# Patient Record
Sex: Female | Born: 1940 | Race: White | Hispanic: No | Smoking: Never smoker
Health system: Southern US, Community
[De-identification: ages and names within clinical notes are randomized; demographics above are authoritative.]

## PROBLEM LIST (undated history)

## (undated) DIAGNOSIS — M5090 Cervical disc disorder, unspecified, unspecified cervical region: Secondary | ICD-10-CM

## (undated) DIAGNOSIS — Z9889 Other specified postprocedural states: Secondary | ICD-10-CM

## (undated) DIAGNOSIS — M1990 Unspecified osteoarthritis, unspecified site: Secondary | ICD-10-CM

## (undated) DIAGNOSIS — J309 Allergic rhinitis, unspecified: Secondary | ICD-10-CM

## (undated) DIAGNOSIS — I10 Essential (primary) hypertension: Secondary | ICD-10-CM

## (undated) DIAGNOSIS — Z8739 Personal history of other diseases of the musculoskeletal system and connective tissue: Secondary | ICD-10-CM

## (undated) DIAGNOSIS — Z8709 Personal history of other diseases of the respiratory system: Secondary | ICD-10-CM

## (undated) DIAGNOSIS — N189 Chronic kidney disease, unspecified: Secondary | ICD-10-CM

## (undated) DIAGNOSIS — K219 Gastro-esophageal reflux disease without esophagitis: Secondary | ICD-10-CM

## (undated) DIAGNOSIS — Z923 Personal history of irradiation: Secondary | ICD-10-CM

## (undated) DIAGNOSIS — C801 Malignant (primary) neoplasm, unspecified: Secondary | ICD-10-CM

## (undated) DIAGNOSIS — Z86718 Personal history of other venous thrombosis and embolism: Secondary | ICD-10-CM

## (undated) DIAGNOSIS — R112 Nausea with vomiting, unspecified: Secondary | ICD-10-CM

## (undated) DIAGNOSIS — E785 Hyperlipidemia, unspecified: Secondary | ICD-10-CM

## (undated) DIAGNOSIS — Z860100 Personal history of colon polyps, unspecified: Secondary | ICD-10-CM

## (undated) DIAGNOSIS — B029 Zoster without complications: Secondary | ICD-10-CM

## (undated) HISTORY — PX: PARTIAL HYSTERECTOMY: SHX80

## (undated) HISTORY — PX: HERNIA REPAIR: SHX51

## (undated) HISTORY — DX: Hyperlipidemia, unspecified: E78.5

## (undated) HISTORY — DX: Allergic rhinitis, unspecified: J30.9

## (undated) HISTORY — PX: ABDOMINAL HYSTERECTOMY: SHX81

## (undated) HISTORY — DX: Personal history of colon polyps, unspecified: Z86.0100

## (undated) HISTORY — DX: Essential (primary) hypertension: I10

## (undated) HISTORY — PX: EYE SURGERY: SHX253

## (undated) HISTORY — PX: WRIST SURGERY: SHX841

## (undated) HISTORY — PX: TUBAL LIGATION: SHX77

## (undated) HISTORY — DX: Personal history of other venous thrombosis and embolism: Z86.718

## (undated) HISTORY — DX: Personal history of colonic polyps: Z86.010

## (undated) HISTORY — DX: Gastro-esophageal reflux disease without esophagitis: K21.9

## (undated) HISTORY — PX: UPPER GI ENDOSCOPY: SHX6162

## (undated) HISTORY — PX: ELBOW SURGERY: SHX618

---

## 2004-09-14 ENCOUNTER — Ambulatory Visit: Payer: Self-pay | Admitting: Internal Medicine

## 2005-02-07 ENCOUNTER — Other Ambulatory Visit: Payer: Self-pay

## 2005-02-13 ENCOUNTER — Ambulatory Visit: Payer: Self-pay | Admitting: Obstetrics and Gynecology

## 2005-10-12 ENCOUNTER — Ambulatory Visit: Payer: Self-pay | Admitting: Internal Medicine

## 2006-05-09 ENCOUNTER — Ambulatory Visit: Payer: Self-pay | Admitting: Gastroenterology

## 2006-12-04 ENCOUNTER — Ambulatory Visit: Payer: Self-pay | Admitting: Obstetrics and Gynecology

## 2007-12-10 ENCOUNTER — Ambulatory Visit: Payer: Self-pay | Admitting: Internal Medicine

## 2007-12-18 ENCOUNTER — Ambulatory Visit: Payer: Self-pay | Admitting: Internal Medicine

## 2008-11-13 ENCOUNTER — Ambulatory Visit: Payer: Self-pay | Admitting: Specialist

## 2008-11-18 ENCOUNTER — Ambulatory Visit: Payer: Self-pay | Admitting: Specialist

## 2008-12-08 ENCOUNTER — Ambulatory Visit: Payer: Self-pay | Admitting: Gastroenterology

## 2008-12-23 ENCOUNTER — Ambulatory Visit: Payer: Self-pay | Admitting: Internal Medicine

## 2009-04-28 ENCOUNTER — Ambulatory Visit: Payer: Self-pay | Admitting: Internal Medicine

## 2010-02-23 ENCOUNTER — Ambulatory Visit: Payer: Self-pay | Admitting: Internal Medicine

## 2011-01-25 ENCOUNTER — Ambulatory Visit: Payer: Self-pay | Admitting: Gastroenterology

## 2011-02-07 HISTORY — PX: COLONOSCOPY: SHX174

## 2011-04-11 DIAGNOSIS — K219 Gastro-esophageal reflux disease without esophagitis: Secondary | ICD-10-CM | POA: Insufficient documentation

## 2011-05-17 ENCOUNTER — Ambulatory Visit: Payer: Self-pay | Admitting: Internal Medicine

## 2011-06-28 ENCOUNTER — Ambulatory Visit: Payer: Self-pay | Admitting: Specialist

## 2012-03-14 ENCOUNTER — Encounter: Payer: Self-pay | Admitting: Internal Medicine

## 2012-03-14 ENCOUNTER — Ambulatory Visit (INDEPENDENT_AMBULATORY_CARE_PROVIDER_SITE_OTHER): Payer: Medicare Other | Admitting: Internal Medicine

## 2012-03-14 VITALS — BP 148/88 | HR 97 | Temp 98.2°F | Ht 62.0 in | Wt 132.4 lb

## 2012-03-14 DIAGNOSIS — R058 Other specified cough: Secondary | ICD-10-CM | POA: Insufficient documentation

## 2012-03-14 DIAGNOSIS — J45909 Unspecified asthma, uncomplicated: Secondary | ICD-10-CM

## 2012-03-14 DIAGNOSIS — R059 Cough, unspecified: Secondary | ICD-10-CM

## 2012-03-14 DIAGNOSIS — R05 Cough: Secondary | ICD-10-CM

## 2012-03-14 MED ORDER — PREDNISONE (PAK) 10 MG PO TABS: ORAL_TABLET | ORAL | Status: DC

## 2012-03-14 MED ORDER — TRAMADOL HCL 50 MG PO TABS: ORAL_TABLET | ORAL | Status: DC

## 2012-03-14 MED ORDER — PANTOPRAZOLE SODIUM 40 MG PO TBEC: 40.0000 mg | DELAYED_RELEASE_TABLET | Freq: Every day | ORAL | Status: DC

## 2012-03-14 MED ORDER — FAMOTIDINE 20 MG PO TABS: ORAL_TABLET | ORAL | Status: DC

## 2012-03-14 NOTE — Progress Notes (Signed)
  Subjective:    Patient ID: Jane Griffin, female    DOB: 08/29/1940  MRN: [id number]  HPI  57 yowf never smoker with no problem until ? In 31's dx of asthma then 2004 dx of asthma and rec chronic singulair rx but did maintain it then started on advair daily x 2008 self referred 03/14/2012 for cough.  03/14/2012 1st pulmonary ov cc daily cough x 3 years  on advair feels choked but minimal mucus and occ vomits. If not coughing breathing is ok.   W/u for gerd by [facility] c/w mild gerd. Some better with mucinex dm.  Sleeping ok without nocturnal  or early am exacerbation  of respiratory  c/o's or need for noct saba. Also denies any obvious fluctuation of symptoms with weather or environmental changes or other aggravating or alleviating factors except as outlined above.     Review of Systems  Constitutional: Negative for fever and unexpected weight change.  HENT: Positive for congestion, rhinorrhea, sneezing, dental problem and sinus pressure. Negative for ear pain, nosebleeds, sore throat, trouble swallowing and postnasal drip.   Eyes: Negative for redness and itching.  Respiratory: Positive for cough, chest tightness and shortness of breath. Negative for wheezing.   Cardiovascular: Negative for palpitations and leg swelling.  Gastrointestinal: Positive for vomiting. Negative for nausea.  Genitourinary: Negative for dysuria.  Musculoskeletal: Negative for joint swelling.  Skin: Negative for rash.  Neurological: Positive for headaches.  Hematological: Does not bruise/bleed easily.  Psychiatric/Behavioral: Positive for dysphoric mood. The patient is nervous/anxious.        Objective:   Physical Exam  amb wf nad with classic voice fatigue Wt Readings from Last 3 Encounters:  03/14/12 132 lb 6.4 oz (60.056 kg)    HEENT: nl dentition, turbinates, and orophanx. Nl external ear canals without cough reflex   NECK :  without JVD/Nodes/TM/ nl carotid upstrokes bilaterally   LUNGS: no acc muscle  use, clear to A and P bilaterally without cough on insp or exp maneuvers   CV:  RRR  no s3 or murmur or increase in P2, no edema   ABD:  soft and nontender with nl excursion in the supine position. No bruits or organomegaly, bowel sounds nl  MS:  warm without deformities, calf tenderness, cyanosis or clubbing  SKIN: warm and dry without lesions    NEURO:  alert, approp, no deficits    CT Chest [place] Calcified granuloma       Assessment & Plan:

## 2012-03-14 NOTE — Assessment & Plan Note (Signed)
Not clear she really has this or whether needs maint rx, esp since symptoms are atypical day >> night unusual of asthma related cough, and she's only having sob when coughing  Since advair may worsen upper airway cough syndrome rec trial off and consider rechallenge with singulair if symptoms flare off advair

## 2012-03-14 NOTE — Assessment & Plan Note (Addendum)
The most common causes of chronic cough in immunocompetent adults include the following: upper airway cough syndrome (UACS), previously referred to as postnasal drip syndrome (PNDS), which is caused by variety of rhinosinus conditions; (2) asthma; (3) GERD; (4) chronic bronchitis from cigarette smoking or other inhaled environmental irritants; (5) nonasthmatic eosinophilic bronchitis; and (6) bronchiectasis.   These conditions, singly or in combination, have accounted for up to 94% of the causes of chronic cough in prospective studies.   Other conditions have constituted no >6% of the causes in prospective studies These have included bronchogenic carcinoma, chronic interstitial pneumonia, sarcoidosis, left ventricular failure, ACEI-induced cough, and aspiration from a condition associated with pharyngeal dysfunction.    Chronic cough is often simultaneously caused by more than one condition. A single cause has been found from 38 to 82% of the time, multiple causes from 18 to 62%. Multiply caused cough has been the result of three diseases up to 42% of the time.       Most likely this is  Classic Upper airway cough syndrome, so named because it's frequently impossible to sort out how much is  CR/sinusitis with freq throat clearing (which can be related to primary GERD)   vs  causing  secondary (" extra esophageal")  GERD from wide swings in gastric pressure that occur with throat clearing, often  promoting self use of mint and menthol lozenges that reduce the lower esophageal sphincter tone and exacerbate the problem further in a cyclical fashion.   These are the same pts (now being labeled as having "irritable larynx syndrome" by some cough centers) who not infrequently have a history of having failed to tolerate ace inhibitors,  dry powder inhalers like advair, which she is on chronically,  or biphosphonates or report having atypical reflux symptoms that don't respond to standard doses of PPI , and are  easily confused as having aecopd or asthma flares by even experienced allergists/ pulmonologists.   For now max rx for gerd then regroup in 4 weeks

## 2012-03-14 NOTE — Patient Instructions (Addendum)
Stop prilosec Try Pantoprazole 40 mg  Take 30-60 min before first meal of the day and Pepcid 20 mg one bedtime until  Return  Stop advair  Take  mucinex dm (guainefisin) 1200mg   every 12 hours and supplement if needed with  tramadol 50 mg up to 1- 2 every 4 hours to suppress the urge to cough. Swallowing water or using ice chips/non mint and menthol containing candies (such as lifesavers or sugarless jolly ranchers) are also effective.  You should rest your voice and avoid activities that you know make you cough.  Once you have eliminated the cough for 3 straight days try reducing the tramadol first,  then the delsym as tolerated.    GERD (REFLUX)  is an extremely common cause of respiratory symptoms, many times with no significant heartburn at all.    It can be treated with medication, but also with lifestyle changes including avoidance of late meals, excessive alcohol, smoking cessation, and avoid fatty foods, chocolate, peppermint, colas, red wine, and acidic juices such as orange juice.  NO MINT OR MENTHOL PRODUCTS SO NO COUGH DROPS  USE SUGARLESS CANDY INSTEAD (jolley ranchers or Stover's)  NO OIL BASED VITAMINS - use powdered substitutes.  Prednisone 10 mg take  4 each am x 2 days,   2 each am x 2 days,  1 each am x2days and stop    Please schedule a follow up office visit in 4 weeks (ok to schedule at [facility])  sooner if needed - could start singulair back if not improving

## 2012-04-05 ENCOUNTER — Telehealth: Payer: Self-pay | Admitting: Internal Medicine

## 2012-04-05 MED ORDER — TRAMADOL HCL 50 MG PO TABS: ORAL_TABLET | ORAL | Status: DC

## 2012-04-05 NOTE — Telephone Encounter (Signed)
Having some nausea from tramadol but working great so rec she try to wean/ cut in half and f/u next avail ov

## 2012-04-19 ENCOUNTER — Ambulatory Visit (INDEPENDENT_AMBULATORY_CARE_PROVIDER_SITE_OTHER): Payer: 59 | Admitting: Internal Medicine

## 2012-04-19 ENCOUNTER — Encounter: Payer: Self-pay | Admitting: Internal Medicine

## 2012-04-19 VITALS — BP 114/80 | HR 69 | Temp 97.4°F | Ht 62.0 in | Wt 128.0 lb

## 2012-04-19 DIAGNOSIS — R059 Cough, unspecified: Secondary | ICD-10-CM

## 2012-04-19 DIAGNOSIS — R05 Cough: Secondary | ICD-10-CM

## 2012-04-19 MED ORDER — TRAMADOL HCL 50 MG PO TABS: ORAL_TABLET | ORAL | Status: DC

## 2012-04-19 MED ORDER — GABAPENTIN 100 MG PO CAPS: 100.0000 mg | ORAL_CAPSULE | Freq: Three times a day (TID) | ORAL | Status: DC

## 2012-04-19 NOTE — Patient Instructions (Addendum)
Start neurontin 100 mg three times a day  Take mucinex dm 1200 mg  every 12 hours and supplement if needed with  tramadol 50 mg up to 2 every 4 hours to suppress the urge to cough. Swallowing water or using ice chips/non mint and menthol containing candies (such as lifesavers or sugarless jolly ranchers) are also effective.  You should rest your voice and avoid activities that you know make you cough.  Once you have eliminated the cough and the urge to clear your throat for 3 straight days try reducing the tramadol first,  then the mucinex dm  tolerated.    Please schedule a follow up office visit in 4 weeks, sooner if needed

## 2012-04-19 NOTE — Progress Notes (Signed)
  Subjective:    Patient ID: Jane Griffin, female    DOB: 23-Jan-1941  MRN: [id number]  HPI  28 yowf never smoker with no problem until ? In 34's dx of asthma then 2004 dx of asthma and rec chronic singulair rx but did maintain it then started on advair daily x 2008 self referred 03/14/2012 for cough.  03/14/2012 1st pulmonary ov cc daily cough x 3 years  on advair feels choked but minimal mucus and occ vomits. If not coughing breathing is ok.   W/u for gerd by [facility] c/w mild gerd. Some better with mucinex dm. rec Stop prilosec Try Pantoprazole 40 mg  Take 30-60 min before first meal of the day and Pepcid 20 mg one bedtime until  Return Stop advair Take  mucinex dm (guainefisin) 1200mg   every 12 hours and supplement if needed with  tramadol 50 mg up to 1- 2 every 4 hours to suppress the urge to cough.   GERD diet  Prednisone 10 mg take  4 each am x 2 days,   2 each am x 2 days,  1 each am x2days and stop  Please schedule a follow up office visit in 4 weeks (ok to schedule at [facility])  sooner if needed - could start singulair back if not improving   04/19/2012 f/u ov/[place] cc 90% better, still urge to clear her throat. Still taking tramadol 50  One half 3 x daily and whole at bedtime. No obvious daytime variabilty or assoc sob cp or chest tightness, subjective wheeze overt sinus or hb symptoms. No unusual exp hx or h/o childhood pna/ asthma or premature birth to her knowledge.   States she's thought about it now and singulair was no help   Sleeping ok without nocturnal  or early am exacerbation  of respiratory  c/o's or need for noct saba. Also denies any obvious fluctuation of symptoms with weather or environmental changes or other aggravating or alleviating factors except as outlined above.           Objective:   Physical Exam  amb wf nad but  still occ throat clearing  04/19/2012   Wt  128  Wt Readings from Last 3 Encounters:  03/14/12 132 lb 6.4 oz (60.056 kg)    HEENT: nl  dentition, turbinates, and orophanx. Nl external ear canals without cough reflex   NECK :  without JVD/Nodes/TM/ nl carotid upstrokes bilaterally   LUNGS: no acc muscle use, clear to A and P bilaterally without cough on insp or exp maneuvers   CV:  RRR  no s3 or murmur or increase in P2, no edema   ABD:  soft and nontender with nl excursion in the supine position. No bruits or organomegaly, bowel sounds nl  MS:  warm without deformities, calf tenderness, cyanosis or clubbing  SKIN: warm and dry without lesions    NEURO:  alert, approp, no deficits    CT Chest [place]  Calcified granuloma       Assessment & Plan:

## 2012-04-21 NOTE — Assessment & Plan Note (Addendum)
No flare of symptoms off advair reinforces  Classic Upper airway cough syndrome, so named because it's frequently impossible to sort out how much is  CR/sinusitis with freq throat clearing (which can be related to primary GERD)   vs  causing  secondary (" extra esophageal")  GERD from wide swings in gastric pressure that occur with throat clearing, often  promoting self use of mint and menthol lozenges that reduce the lower esophageal sphincter tone and exacerbate the problem further in a cyclical fashion.   These are the same pts (now being labeled as having "irritable larynx syndrome" by some cough centers) who not infrequently have a history of having failed to tolerate ace inhibitors,  dry powder inhalers or biphosphonates or report having atypical reflux symptoms that don't respond to standard doses of PPI , and are easily confused as having aecopd or asthma flares by even experienced allergists/ pulmonologists.   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  Please see instructions for details which were reviewed in writing and the patient given a copy.

## 2012-05-15 ENCOUNTER — Encounter: Payer: Self-pay | Admitting: Internal Medicine

## 2012-05-15 ENCOUNTER — Telehealth: Payer: Self-pay | Admitting: Internal Medicine

## 2012-05-15 ENCOUNTER — Ambulatory Visit (INDEPENDENT_AMBULATORY_CARE_PROVIDER_SITE_OTHER): Payer: 59 | Admitting: Internal Medicine

## 2012-05-15 VITALS — BP 118/70 | HR 88 | Temp 98.1°F | Ht 62.0 in | Wt 129.0 lb

## 2012-05-15 DIAGNOSIS — R05 Cough: Secondary | ICD-10-CM

## 2012-05-15 DIAGNOSIS — J45909 Unspecified asthma, uncomplicated: Secondary | ICD-10-CM

## 2012-05-15 DIAGNOSIS — R059 Cough, unspecified: Secondary | ICD-10-CM

## 2012-05-15 MED ORDER — PREDNISONE (PAK) 10 MG PO TABS: ORAL_TABLET | ORAL | Status: DC

## 2012-05-15 NOTE — Patient Instructions (Addendum)
Start neurontin 100 mg four times a day and call me with name of the other bedtime pill you are taking as it is not on our list  Prednisone 10 mg take  4 each am x 2 days,   2 each am x 2 days,  1 each am x2days and stop   Take mucinex dm 1200 mg  every 12 hours and supplement if needed with  tramadol 50 mg up to 2 every 4 hours to suppress the urge to cough. Swallowing water or using ice chips/non mint and menthol containing candies (such as lifesavers or sugarless jolly ranchers) are also effective.  You should rest your voice and avoid activities that you know make you cough.  Once you have eliminated the cough and the urge to clear your throat for 3 straight days try reducing the tramadol first,  then the mucinex dm  tolerated.    Please schedule a follow up office visit in 4 weeks, sooner if needed

## 2012-05-15 NOTE — Telephone Encounter (Signed)
Will forward to MW as FYI  

## 2012-05-15 NOTE — Assessment & Plan Note (Signed)
-   Trial off advair 03/14/2012 >>> cough better but still throat clearing - Neurontin trial 04/19/12  Still strongly feel this is  Classic Upper airway cough syndrome, so named because it's frequently impossible to sort out how much is  CR/sinusitis with freq throat clearing (which can be related to primary GERD)   vs  causing  secondary (" extra esophageal")  GERD from wide swings in gastric pressure that occur with throat clearing, often  promoting self use of mint and menthol lozenges that reduce the lower esophageal sphincter tone and exacerbate the problem further in a cyclical fashion.   These are the same pts (now being labeled as having "irritable larynx syndrome" by some cough centers) who not infrequently have a history of having failed to tolerate ace inhibitors,  dry powder inhalers or biphosphonates or report having atypical reflux symptoms that don't respond to standard doses of PPI , and are easily confused as having aecopd or asthma flares by even experienced allergists/ pulmonologists.   The standardized cough guidelines published in Chest by [place] in 2006 are still the best available and consist of a multiple step process (up to 12!) , not a single office visit,  and are intended  to address this problem logically,  with an alogrithm dependent on response to empiric treatment at  each progressive step  to determine a specific diagnosis with  minimal addtional testing needed. Therefore if adherence is an issue or can't be accurately verified,  it's very unlikely the standard evaluation and treatment will be successful here.    Furthermore, response to therapy (other than acute cough suppression, which should only be used short term with avoidance of narcotic containing cough syrups if possible), can be a gradual process for which the patient may perceive immediate benefit.  Unlike going to an eye doctor where the best perscription is almost always the first one and is immediately  effective, this is almost never the case in the management of chronic cough syndromes. Therefore the patient needs to commit up front to consistently adhere to recommendations  for up to 6 weeks of therapy directed at the likely underlying problem(s) before the response can be reasonably evaluated.   Explained maint vs prns with neurontin the former, not the latter, so try increase neurontin to 100 mg qid and push to max of 900 mg per day based on her partial response

## 2012-05-15 NOTE — Telephone Encounter (Signed)
Pt aware and denied any questions

## 2012-05-15 NOTE — Telephone Encounter (Signed)
Make sure she knows that is the same as pepcid so should not be taking both

## 2012-05-15 NOTE — Progress Notes (Signed)
Subjective:    Patient ID: Jane Griffin, female    DOB: 1940-06-02  MRN: [id number]  HPI  75 yowf never smoker with no problem until ? In 39's dx of asthma then 2004 dx of asthma and rec chronic singulair rx but did maintain it then started on advair daily x 2008 self referred 03/14/2012 for cough.  03/14/2012 1st pulmonary ov cc daily cough x 3 years  on advair feels choked but minimal mucus and occ vomits. If not coughing breathing is ok.   W/u for gerd by [facility] c/w mild gerd. Some better with mucinex dm. rec Stop prilosec Try Pantoprazole 40 mg  Take 30-60 min before first meal of the day and Pepcid 20 mg one bedtime until  Return Stop advair Take  mucinex dm (guainefisin) 1200mg   every 12 hours and supplement if needed with  tramadol 50 mg up to 1- 2 every 4 hours to suppress the urge to cough.   GERD diet  Prednisone 10 mg take  4 each am x 2 days,   2 each am x 2 days,  1 each am x 2 days and stop  Please schedule a follow up office visit in 4 weeks (ok to schedule at [facility])  sooner if needed - could start singulair back if not improving   04/19/2012 f/u ov/[place] [place] cc 90% better, still urge to clear her throat. Still taking tramadol 50  One half 3 x daily and whole at bedtime.  states she's thought about it now and singulair was no help rec Start neurontin 100 mg three times a day  Take mucinex dm 1200 mg  every 12 hours and supplement if needed with  tramadol 50 mg up to 2 every 4 hours to suppress the urge to cough Once you have eliminated the cough and the urge to clear your throat for 3 straight days try reducing the tramadol first,  then the mucinex dm  tolerated.      05/15/2012 pulmonary f/u ov/ [place] Chief Complaint  Patient presents with  . Follow-up    Cough is some better since started neurontin, she notices the cough is worse if she forgets to take med.    Confused with meds and the difference between mant neurontin and prn tramadol but does feel better since taking  neuronitin    No obvious daytime variabilty or assoc sob cp or chest tightness, subjective wheeze overt sinus or hb symptoms. No unusual exp hx or h/o childhood pna/ asthma or premature birth to her knowledge.     Sleeping ok without nocturnal  or early am exacerbation  of respiratory  c/o's or need for noct saba. Also denies any obvious fluctuation of symptoms with weather or environmental changes or other aggravating or alleviating factors except as outlined above.  ROS  The following are not active complaints unless bolded sore throat, dysphagia, dental problems, itching, sneezing,  nasal congestion or excess/ purulent secretions, ear ache,   fever, chills, sweats, unintended wt loss, pleuritic or exertional cp, hemoptysis,  orthopnea pnd or leg swelling, presyncope, palpitations, heartburn, abdominal pain, anorexia, nausea, vomiting, diarrhea  or change in bowel or urinary habits, change in stools or urine, dysuria,hematuria,  rash, arthralgias, visual complaints, headache, numbness weakness or ataxia or problems with walking or coordination,  change in mood/affect or memory.               Objective:   Physical Exam  amb wf nad but  still occ throat clearing  04/19/2012  Wt  128  Wt Readings from Last 3 Encounters:  03/14/12 132 lb 6.4 oz (60.056 kg)    HEENT: nl dentition, turbinates, and orophanx. Nl external ear canals without cough reflex   NECK :  without JVD/Nodes/TM/ nl carotid upstrokes bilaterally   LUNGS: no acc muscle use, clear to A and P bilaterally without cough on insp or exp maneuvers   CV:  RRR  no s3 or murmur or increase in P2, no edema   ABD:  soft and nontender with nl excursion in the supine position. No bruits or organomegaly, bowel sounds nl  MS:  warm without deformities, calf tenderness, cyanosis or clubbing  SKIN: warm and dry without lesions    NEURO:  alert, approp, no deficits    CT Chest [place]  Calcified granuloma        Assessment & Plan:

## 2012-05-15 NOTE — Assessment & Plan Note (Signed)
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

## 2012-05-16 ENCOUNTER — Ambulatory Visit: Payer: 59 | Admitting: Internal Medicine

## 2012-05-17 ENCOUNTER — Ambulatory Visit: Payer: 59 | Admitting: Internal Medicine

## 2012-05-21 ENCOUNTER — Ambulatory Visit: Payer: Self-pay | Admitting: Internal Medicine

## 2012-05-23 ENCOUNTER — Other Ambulatory Visit: Payer: Self-pay | Admitting: Internal Medicine

## 2012-06-07 ENCOUNTER — Other Ambulatory Visit: Payer: Self-pay | Admitting: Internal Medicine

## 2012-06-13 ENCOUNTER — Ambulatory Visit: Payer: 59 | Admitting: Internal Medicine

## 2012-06-14 ENCOUNTER — Ambulatory Visit (INDEPENDENT_AMBULATORY_CARE_PROVIDER_SITE_OTHER): Payer: 59 | Admitting: Internal Medicine

## 2012-06-14 ENCOUNTER — Encounter: Payer: Self-pay | Admitting: Internal Medicine

## 2012-06-14 VITALS — BP 120/68 | HR 92 | Temp 97.6°F | Ht 62.0 in | Wt 126.0 lb

## 2012-06-14 DIAGNOSIS — R05 Cough: Secondary | ICD-10-CM

## 2012-06-14 DIAGNOSIS — R059 Cough, unspecified: Secondary | ICD-10-CM

## 2012-06-14 MED ORDER — TRAMADOL HCL 50 MG PO TABS: ORAL_TABLET | ORAL | Status: DC

## 2012-06-14 MED ORDER — MONTELUKAST SODIUM 10 MG PO TABS: ORAL_TABLET | ORAL | Status: DC

## 2012-06-14 MED ORDER — PREDNISONE (PAK) 10 MG PO TABS: ORAL_TABLET | ORAL | Status: DC

## 2012-06-14 NOTE — Patient Instructions (Addendum)
Prednisone 10 mg take  4 each am x 2 days,  2 each am x 2 days,  1 each am x2days and stop   Take delsym two tsp every 12 hours and supplement if needed with  tramadol 50 mg up to 2 every 4 hours to suppress the urge to cough. Swallowing water or using ice chips/non mint and menthol containing candies (such as lifesavers or sugarless jolly ranchers) are also effective.  You should rest your voice and avoid activities that you know make you cough.  Once you have eliminated the cough for 3 straight days try reducing the tramadol first,  then the delsym as tolerated.    Add singulair 10 mg one each pm   Please schedule a follow up office visit in 4 weeks, sooner if needed with all medications including over the counters Add Consider methacholine challenge test, sinus ct, allergy profile next

## 2012-06-14 NOTE — Progress Notes (Signed)
Subjective:    Patient ID: Jane Griffin, female    DOB: 1940-06-30  MRN: [id number]  HPI  81 yowf never smoker with no problem until ? In 88's dx of asthma then 2004 dx of asthma and rec chronic singulair rx but did not maintain it then started on advair daily x 2008 self referred 03/14/2012 for cough.  03/14/2012 1st pulmonary ov cc daily cough x 3 years  on advair feels choked but minimal mucus and occ vomits. If not coughing breathing is ok.   W/u for gerd by [facility] c/w mild gerd. Some better with mucinex dm. rec Stop prilosec Try Pantoprazole 40 mg  Take 30-60 min before first meal of the day and Pepcid 20 mg one bedtime until  Return Stop advair Take  mucinex dm (guainefisin) 1200mg   every 12 hours and supplement if needed with  tramadol 50 mg up to 1- 2 every 4 hours to suppress the urge to cough.   GERD diet  Prednisone 10 mg take  4 each am x 2 days,   2 each am x 2 days,  1 each am x 2 days and stop  Please schedule a follow up office visit in 4 weeks (ok to schedule at [facility])  sooner if needed - could start singulair back if not improving   04/19/2012 f/u ov/Jane Griffin cc 90% better, still urge to clear her throat. Still taking tramadol 50  One half 3 x daily and whole at bedtime.  states she's thought about it now and singulair was no help rec Start neurontin 100 mg three times a day Take mucinex dm 1200 mg  every 12 hours and supplement if needed with  tramadol 50 mg up to 2 every 4 hours to suppress the urge to cough Once you have eliminated the cough and the urge to clear your throat for 3 straight days try reducing the tramadol first,  then the mucinex dm  tolerated.      05/15/2012 pulmonary f/u ov/ [place] Chief Complaint  Patient presents with  . Follow-up    Cough is some better since started neurontin, she notices the cough is worse if she forgets to take med.    Confused with meds and the difference between mant neurontin and prn tramadol but does feel better since  taking neuronitin  rec Start neurontin 100 mg four times a day and call me with name of the other bedtime pill you are taking as it is not on our list Prednisone 10 mg take  4 each am x 2 days,   2 each am x 2 days,  1 each am x2days and stop  Take mucinex dm 1200 mg  every 12 hours and supplement if needed with  tramadol 50 mg up to 2 every 4 hours to suppress the urge to cough.  Once you have eliminated the cough and the urge to clear your throat for 3 straight days try reducing the tramadol first,  then the mucinex dm  Tolerated.  06/14/2012 f/u ov/Jane Griffin  Chief Complaint  Patient presents with  . Cough    Cough is unchanged, she tried taking gabapentin but still required mucinex and tramadol to control the cough. She took gabapentin for 3 wks and had to d/c med due to having nightmares.   Mostly dry cough is most bothersome in afternoons, sleeps fine. Off neurontin x one week and no change in cough, nightmares resolved.   No obvious daytime variabilty or assoc sob cp or chest tightness,  subjective wheeze overt sinus or hb symptoms. No unusual exp hx or h/o childhood pna/ asthma or premature birth to her knowledge.     Sleeping ok without nocturnal  or early am exacerbation  of respiratory  c/o's or need for noct saba. Also denies any obvious fluctuation of symptoms with weather or environmental changes or other aggravating or alleviating factors except as outlined above.  Current Medications, Allergies, Past Medical History, Past Surgical History, Family History, and Social History were reviewed in [employer] record.  ROS  The following are not active complaints unless bolded sore throat, dysphagia, dental problems, itching, sneezing,  nasal congestion or excess/ purulent secretions, ear ache,   fever, chills, sweats, unintended wt loss, pleuritic or exertional cp, hemoptysis,  orthopnea pnd or leg swelling, presyncope, palpitations, heartburn, abdominal pain,  anorexia, nausea, vomiting, diarrhea  or change in bowel or urinary habits, change in stools or urine, dysuria,hematuria,  rash, arthralgias, visual complaints, headache, numbness weakness or ataxia or problems with walking or coordination,  change in mood/affect or memory.                   Objective:   Physical Exam  amb wf nad but  still occ throat clearing  04/19/2012   Wt  128  Vs  06/14/2012  126  Wt Readings from Last 3 Encounters:  03/14/12 132 lb 6.4 oz (60.056 kg)    HEENT: nl dentition, turbinates, and orophanx. Nl external ear canals without cough reflex   NECK :  without JVD/Nodes/TM/ nl carotid upstrokes bilaterally   LUNGS: no acc muscle use, clear to A and P bilaterally without cough on insp or exp maneuvers   CV:  RRR  no s3 or murmur or increase in P2, no edema   ABD:  soft and nontender with nl excursion in the supine position. No bruits or organomegaly, bowel sounds nl  MS:  warm without deformities, calf tenderness, cyanosis or clubbing     CT Chest [place]  Calcified granuloma       Assessment & Plan:

## 2012-06-15 NOTE — Assessment & Plan Note (Signed)
At this point cough is better but not resolved and still has a tendency to cyclical coughing she's never really eliminated.  Still feel upper airway cough syndrome most likely but haven't ruled out asthma, her previous working dx, though absence of flare off advair and absence of noct symptoms rules against. Also still haven't accomplished full medication reconciliation ? Could she surreptitiously be on ACEi?   Next step is add singulair trial and try to get 100% ahead of the cough to see if it comes back again after stopping cough suppression, if so next step is methacholine challenge test based on published cough guidelines    Each maintenance medication was reviewed in detail including most importantly the difference between maintenance and as needed and under what circumstances the prns are to be used.  Please see instructions for details which were reviewed in writing and the patient given a copy.   To keep things simple, I have asked the patient to first separate medicines that are perceived as maintenance, that is to be taken daily "no matter what", from those medicines that are taken on only on an as-needed basis and I have given the patient examples of both, and then return here with both.

## 2012-07-04 ENCOUNTER — Telehealth: Payer: Self-pay | Admitting: Internal Medicine

## 2012-07-04 MED ORDER — FAMOTIDINE 20 MG PO TABS: ORAL_TABLET | ORAL | Status: DC

## 2012-07-04 MED ORDER — TRAMADOL HCL 50 MG PO TABS: ORAL_TABLET | ORAL | Status: DC

## 2012-07-04 NOTE — Telephone Encounter (Signed)
Rxs were refilled [facility] for the pt to be made aware

## 2012-07-22 ENCOUNTER — Ambulatory Visit: Payer: 59 | Admitting: Internal Medicine

## 2012-07-30 ENCOUNTER — Other Ambulatory Visit: Payer: Self-pay | Admitting: Internal Medicine

## 2012-07-31 ENCOUNTER — Telehealth: Payer: Self-pay | Admitting: Internal Medicine

## 2012-07-31 NOTE — Telephone Encounter (Signed)
MW- I got a refill request for tramadol and denied it since the pt has cancelled her last 6 appts with you!! She scheduled an appt with you for 08/21/12 and wants to know if she can have refill until then  I spoke with her and she states that her cough has improved, but when it does flare up, the tramadol really helps Please advise if ok to refill thanks!

## 2012-08-01 MED ORDER — TRAMADOL HCL 50 MG PO TABS: ORAL_TABLET | ORAL | Status: DC

## 2012-08-01 NOTE — Telephone Encounter (Signed)
First find out how many she takes on avg a week, then give her that wamt for 3 weeks but explain that will be the last refill we will be doing over the phone.

## 2012-08-01 NOTE — Telephone Encounter (Signed)
Spoke with patient, patient aware Rx has been sent to verified pharmacy and that she has to keep her scheduled follow up for any further refills.

## 2012-08-01 NOTE — Telephone Encounter (Signed)
ATC patient, no answer [id number] 

## 2012-08-01 NOTE — Telephone Encounter (Signed)
Returning call can be reached at [phone].Jane Griffin

## 2012-08-19 ENCOUNTER — Ambulatory Visit: Payer: 59 | Admitting: Internal Medicine

## 2012-08-21 ENCOUNTER — Encounter: Payer: Self-pay | Admitting: Internal Medicine

## 2012-08-21 ENCOUNTER — Ambulatory Visit (INDEPENDENT_AMBULATORY_CARE_PROVIDER_SITE_OTHER): Payer: Medicare Other | Admitting: Internal Medicine

## 2012-08-21 VITALS — BP 122/80 | HR 68 | Temp 98.8°F | Ht 62.0 in | Wt 125.0 lb

## 2012-08-21 DIAGNOSIS — R05 Cough: Secondary | ICD-10-CM

## 2012-08-21 DIAGNOSIS — R059 Cough, unspecified: Secondary | ICD-10-CM

## 2012-08-21 MED ORDER — TRAMADOL HCL 50 MG PO TABS: ORAL_TABLET | ORAL | Status: DC

## 2012-08-21 MED ORDER — PREDNISONE (PAK) 10 MG PO TABS: ORAL_TABLET | ORAL | Status: DC

## 2012-08-21 NOTE — Assessment & Plan Note (Addendum)
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 eos bronchitis aro rhinitis then consider allergy w/u, sinuc ct and methacholine challenge test based on published cough guidelines,      Each maintenance medication was reviewed in detail including most importantly the difference between maintenance and as needed and under what circumstances the prns are to be used.  Please see instructions for details which were reviewed in writing and the patient given a copy.

## 2012-08-21 NOTE — Progress Notes (Signed)
Subjective:    Patient ID: Jane Griffin, female    DOB: 07-31-1940  MRN: [id number]   Brief patient profile:  78 yowf never smoker with no problem until ? In 38's dx of asthma then 2004 dx of asthma and rec chronic singulair rx but did not maintain it then started on advair daily x 2008 self referred 03/14/2012 for cough x 2011   HPI 03/14/2012 1st pulmonary ov cc daily cough x 3 years  on advair feels choked but minimal mucus and occ vomits. If not coughing breathing is ok.   W/u for gerd by [facility] c/w mild gerd. Some better with mucinex dm. rec Stop prilosec Try Pantoprazole 40 mg  Take 30-60 min before first meal of the day and Pepcid 20 mg one bedtime until  Return Stop advair Take  mucinex dm (guainefisin) 1200mg   every 12 hours and supplement if needed with  tramadol 50 mg up to 1- 2 every 4 hours to suppress the urge to cough.   GERD diet  Prednisone 10 mg take  4 each am x 2 days,   2 each am x 2 days,  1 each am x 2 days and stop  Please schedule a follow up office visit in 4 weeks (ok to schedule at [facility])  sooner if needed - could start singulair back if not improving   04/19/2012 f/u ov/[place] cc 90% better, still urge to clear her throat. Still taking tramadol 50  One half 3 x daily and whole at bedtime.  states she's thought about it now and singulair was no help rec Start neurontin 100 mg three times a day Take mucinex dm 1200 mg  every 12 hours and supplement if needed with  tramadol 50 mg up to 2 every 4 hours to suppress the urge to cough Once you have eliminated the cough and the urge to clear your throat for 3 straight days try reducing the tramadol first,  then the mucinex dm  tolerated.      05/15/2012 pulmonary f/u ov/ [place] Chief Complaint  Patient presents with  . Follow-up    Cough is some better since started neurontin, she notices the cough is worse if she forgets to take med.    Confused with meds and the difference between mant neurontin and prn tramadol  but does feel better since taking neuronitin  rec Start neurontin 100 mg four times a day and call me with name of the other bedtime pill you are taking as it is not on our list Prednisone 10 mg take  4 each am x 2 days,   2 each am x 2 days,  1 each am x2days and stop  Take mucinex dm 1200 mg  every 12 hours and supplement if needed with  tramadol 50 mg up to 2 every 4 hours to suppress the urge to cough.  Once you have eliminated the cough and the urge to clear your throat for 3 straight days try reducing the tramadol first,  then the mucinex dm  Tolerated.  06/14/2012 f/u ov/[place] cough x years Chief Complaint  Patient presents with  . Cough    Cough is unchanged, she tried taking gabapentin but still required mucinex and tramadol to control the cough. She took gabapentin for 3 wks and had to d/c med due to having nightmares.   Mostly dry cough is most bothersome in afternoons, sleeps fine. Off neurontin x one week and no change in cough, nightmares resolved. rec Prednisone 10 mg  take  4 each am x 2 days,  2 each am x 2 days,  1 each am x2days and stop   Take delsym two tsp every 12 hours and supplement if needed with  tramadol 50 mg up to 2 every 4 hours to suppress the urge to cough. Swallowing water or using ice chips/non mint and menthol containing candies (such as lifesavers or sugarless jolly ranchers) are also effective.  You should rest your voice and avoid activities that you know make you cough. Once you have eliminated the cough for 3 straight days try reducing the tramadol first,  then the delsym as tolerated.   Add singulair 10 mg one each pm  X one month trial > no better.  Please schedule a follow up office visit in 4 weeks, sooner if needed with all medications including over the counters Add Consider methacholine challenge test, sinus ct, allergy profile next     08/21/2012 f/u ov/[place] cough x 2011 75% better   Chief Complaint  Patient presents with  . Follow-up    Cough  is much improved since her last visit, no new co's  still on tramadol twice daily, still notes in afternoons before supper daily, mostly dry.   No obvious daytime variabilty or assoc sob cp or chest tightness, subjective wheeze overt sinus or hb symptoms. No unusual exp hx or h/o childhood pna/ asthma or premature birth to her knowledge.     Sleeping ok without nocturnal  or early am exacerbation  of respiratory  c/o's or need for noct saba. Also denies any obvious fluctuation of symptoms with weather or environmental changes or other aggravating or alleviating factors except as outlined above.  Current Medications, Allergies, Past Medical History, Past Surgical History, Family History, and Social History were reviewed in [employer] record.  ROS  The following are not active complaints unless bolded sore throat, dysphagia, dental problems, itching, sneezing,  nasal congestion or excess/ purulent secretions, ear ache,   fever, chills, sweats, unintended wt loss, pleuritic or exertional cp, hemoptysis,  orthopnea pnd or leg swelling, presyncope, palpitations, heartburn, abdominal pain, anorexia, nausea, vomiting, diarrhea  or change in bowel or urinary habits, change in stools or urine, dysuria,hematuria,  rash, arthralgias, visual complaints, headache, numbness weakness or ataxia or problems with walking or coordination,  change in mood/affect or memory.                   Objective:   Physical Exam  amb wf nad but  No longer  throat clearing  04/19/2012   Wt  128  Vs  06/14/2012  126 vs 08/21/2012  125 Wt Readings from Last 3 Encounters:  03/14/12 132 lb 6.4 oz (60.056 kg)    HEENT: nl dentition, turbinates, and orophanx. Nl external ear canals without cough reflex   NECK :  without JVD/Nodes/TM/ nl carotid upstrokes bilaterally   LUNGS: no acc muscle use, clear to A and P bilaterally without cough on insp or exp maneuvers   CV:  RRR  no s3 or murmur or  increase in P2, no edema   ABD:  soft and nontender with nl excursion in the supine position. No bruits or organomegaly, bowel sounds nl  MS:  warm without deformities, calf tenderness, cyanosis or clubbing     CT Chest [place]  Calcified granuloma       Assessment & Plan:

## 2012-08-21 NOTE — Patient Instructions (Addendum)
No change on protonix and pepcid  And take tramadol up to twice daily if needed for cough control but always try to use delsym since  Prednisone 10 mg take  4 each am x 2 days,   2 each am x 2 days,  1 each am x 2 days and stop   Ok to continue ultram twice daily   Please schedule a follow up office visit in 6 weeks, call sooner if needed

## 2012-10-08 ENCOUNTER — Ambulatory Visit: Payer: 59 | Admitting: Internal Medicine

## 2012-10-10 ENCOUNTER — Encounter: Payer: Self-pay | Admitting: Internal Medicine

## 2012-10-10 ENCOUNTER — Other Ambulatory Visit (INDEPENDENT_AMBULATORY_CARE_PROVIDER_SITE_OTHER): Payer: Medicare Other

## 2012-10-10 ENCOUNTER — Ambulatory Visit (INDEPENDENT_AMBULATORY_CARE_PROVIDER_SITE_OTHER): Payer: 59 | Admitting: Internal Medicine

## 2012-10-10 VITALS — BP 122/70 | HR 70 | Temp 97.0°F | Ht 63.0 in | Wt 125.0 lb

## 2012-10-10 DIAGNOSIS — R059 Cough, unspecified: Secondary | ICD-10-CM

## 2012-10-10 DIAGNOSIS — R05 Cough: Secondary | ICD-10-CM

## 2012-10-10 LAB — CBC WITH DIFFERENTIAL/PLATELET
Basophils Absolute: 0 10*3/uL (ref 0.0–0.1)
Eosinophils Absolute: 0.1 10*3/uL (ref 0.0–0.7)
HCT: 42.6 % (ref 36.0–46.0)
Hemoglobin: 14.4 g/dL (ref 12.0–15.0)
Lymphs Abs: 2.9 10*3/uL (ref 0.7–4.0)
MCHC: 33.8 g/dL (ref 30.0–36.0)
Monocytes Absolute: 0.7 10*3/uL (ref 0.1–1.0)
Neutro Abs: 6.1 10*3/uL (ref 1.4–7.7)
Platelets: 308 10*3/uL (ref 150.0–400.0)
RDW: 14.1 % (ref 11.5–14.6)

## 2012-10-10 NOTE — Patient Instructions (Addendum)
Please see patient coordinator before you leave today  to schedule methacholine challenge test  Add chlortrimeton 4mg  (over the counter) at bedtime automatically to see if helps sleep and drainage  Please remember to go to the lab  department downstairs for your tests - we will call you with the results when they are available.

## 2012-10-10 NOTE — Progress Notes (Signed)
Subjective:    Patient ID: Jane Griffin, female    DOB: 1940/05/13  MRN: [id number]   Brief patient profile:  28 yowf never smoker with no problem until ? In 60's dx of asthma then 2004 dx of asthma and rec chronic singulair rx but did not maintain it then started on advair daily x 2008 self referred 03/14/2012 for cough x 2011   HPI 03/14/2012 1st pulmonary ov cc daily cough x 3 years  on advair feels choked but minimal mucus and occ vomits. If not coughing breathing is ok.   W/u for gerd by [facility] c/w mild gerd. Some better with mucinex dm. rec Stop prilosec Try Pantoprazole 40 mg  Take 30-60 min before first meal of the day and Pepcid 20 mg one bedtime until  Return Stop advair Take  mucinex dm (guainefisin) 1200mg   every 12 hours and supplement if needed with  tramadol 50 mg up to 1- 2 every 4 hours to suppress the urge to cough.   GERD diet  Prednisone 10 mg take  4 each am x 2 days,   2 each am x 2 days,  1 each am x 2 days and stop  Please schedule a follow up office visit in 4 weeks (ok to schedule at [facility])  sooner if needed - could start singulair back if not improving   04/19/2012 f/u ov/Jane Griffin cc 90% better, still urge to clear her throat. Still taking tramadol 50  One half 3 x daily and whole at bedtime.  states she's thought about it now and singulair was no help rec Start neurontin 100 mg three times a day Take mucinex dm 1200 mg  every 12 hours and supplement if needed with  tramadol 50 mg up to 2 every 4 hours to suppress the urge to cough Once you have eliminated the cough and the urge to clear your throat for 3 straight days try reducing the tramadol first,  then the mucinex dm  tolerated.      05/15/2012 pulmonary f/u ov/ Jane Griffin Complaint  Patient presents with  . Follow-up    Cough is some better since started neurontin, she notices the cough is worse if she forgets to take med.   Confused with meds and the difference between mant neurontin and prn tramadol  but does feel better since taking neuronitin  rec Start neurontin 100 mg four times a day and call me with name of the other bedtime pill you are taking as it is not on our list Prednisone 10 mg take  4 each am x 2 days,   2 each am x 2 days,  1 each am x2days and stop  Take mucinex dm 1200 mg  every 12 hours and supplement if needed with  tramadol 50 mg up to 2 every 4 hours to suppress the urge to cough.  Once you have eliminated the cough and the urge to clear your throat for 3 straight days try reducing the tramadol first,  then the mucinex dm  Tolerated.  06/14/2012 f/u ov/Jane Griffin cough x years Griffin Complaint  Patient presents with  . Cough    Cough is unchanged, she tried taking gabapentin but still required mucinex and tramadol to control the cough. She took gabapentin for 3 wks and had to d/c med due to having nightmares.   Mostly dry cough is most bothersome in afternoons, sleeps fine. Off neurontin x one week and no change in cough, nightmares resolved. rec Prednisone 10 mg take  4 each am x 2 days,  2 each am x 2 days,  1 each am x2days and stop  Take delsym two tsp every 12 hours and supplement if needed with  tramadol 50 mg up to 2 every 4 hours to suppress the urge to cough.   Once you have eliminated the cough for 3 straight days try reducing the tramadol first,  then the delsym as tolerated.   Add singulair 10 mg one each pm  X one month trial > no better.  Please schedule a follow up office visit in 4 weeks, sooner if needed with all medications including over the counters Add Consider methacholine challenge test, sinus ct, allergy profile next     08/21/2012 f/u ov/Jane Griffin cough x 2011 75% better   [place]iffin Complaint  Patient presents with  . Follow-up    Cough is much improved since her last visit, no new co's  still on tramadol twice daily, still notes in afternoons before supper daily, mostly dry.  rec No change on protonix and pepcid   Prednisone 10 mg take  4 each am x 2  days,   2 each am x 2 days,  1 each am x 2 days and stop  Ok to continue ultram up to  twice daily but use delsym first    10/10/2012 f/u ov/Jane Griffin re chronic cough  [place] Complaint  Patient presents with  . Follow-up    Cough continues to improve, no new co's today.   dust from mowing caused cough, otherwise no obvious trigger or flare since last ov, but still using tramadol bid    No obvious daytime variabilty or assoc sob cp or chest tightness, subjective wheeze overt sinus or hb symptoms. No unusual exp hx or h/o childhood pna/ asthma or premature birth to her knowledge.     Sleeping ok without nocturnal  or early am exacerbation  of respiratory  c/o's or need for noct saba. Also denies any obvious fluctuation of symptoms with weather or environmental changes or other aggravating or alleviating factors except as outlined above.  Current Medications, Allergies, Past Medical History, Past Surgical History, Family History, and Social History were reviewed in [employer] record.  ROS  The following are not active complaints unless bolded sore throat, dysphagia, dental problems, itching, sneezing,  nasal congestion or excess/ purulent secretions, ear ache,   fever, chills, sweats, unintended wt loss, pleuritic or exertional cp, hemoptysis,  orthopnea pnd or leg swelling, presyncope, palpitations, heartburn, abdominal pain, anorexia, nausea, vomiting, diarrhea  or change in bowel or urinary habits, change in stools or urine, dysuria,hematuria,  rash, arthralgias, visual complaints, headache, numbness weakness or ataxia or problems with walking or coordination,  change in mood/affect or memory.                   Objective:   Physical Exam  amb wf nad but  No longer  throat clearing  04/19/2012   Wt  128  Vs  06/14/2012  126 vs 08/21/2012  125 > 10/10/2012  125 Wt Readings from Last 3 Encounters:  03/14/12 132 lb 6.4 oz (60.056 kg)    HEENT: nl dentition, turbinates,  and orophanx. Nl external ear canals without cough reflex   NECK :  without JVD/Nodes/TM/ nl carotid upstrokes bilaterally   LUNGS: no acc muscle use, clear to A and P bilaterally without cough on insp or exp maneuvers   CV:  RRR  no s3 or murmur or increase  in P2, no edema   ABD:  soft and nontender with nl excursion in the supine position. No bruits or organomegaly, bowel sounds nl  MS:  warm without deformities, calf tenderness, cyanosis or clubbing     CT Chest [place]  Calcified granuloma       Assessment & Plan:

## 2012-10-11 ENCOUNTER — Encounter: Payer: Self-pay | Admitting: Internal Medicine

## 2012-10-11 ENCOUNTER — Telehealth: Payer: Self-pay | Admitting: *Deleted

## 2012-10-11 LAB — ALLERGY PROFILE REGION II-DC, DE, MD, ~~LOC~~, VA
Allergen, D pternoyssinus,d7: <0.1 kU/L
Alternaria Alternata: <0.1 kU/L
Box Elder IgE: <0.1 kU/L
Cladosporium Herbarum: <0.1 kU/L
Cockroach: <0.1 kU/L
Common Ragweed: <0.1 kU/L
IgE (Immunoglobulin E), Serum: 139 IU/mL (ref 0.0–180.0)
Johnson Grass: <0.1 kU/L
Pecan/Hickory Tree IgE: <0.1 kU/L

## 2012-10-11 MED ORDER — TRAMADOL HCL 50 MG PO TABS: ORAL_TABLET | ORAL | Status: DC

## 2012-10-11 NOTE — Telephone Encounter (Signed)
Ok to refill 

## 2012-10-11 NOTE — Telephone Encounter (Signed)
Pt reports we were suppose to refill her tramadol yesterday. Please advise MW thanks

## 2012-10-11 NOTE — Telephone Encounter (Signed)
RX has been called into [facility].

## 2012-10-12 NOTE — Assessment & Plan Note (Addendum)
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   These are the same pts (now being labeled as having "irritable larynx syndrome" by some cough centers) who not infrequently have a history of having failed to tolerate ace inhibitors,  dry powder inhalers like advair which may be the case here or biphosphonates or report having atypical reflux symptoms that don't respond to standard doses of PPI , and are easily confused as having aecopd or asthma flares by even experienced allergists/ pulmonologists.  Will add prn 1st gen h1 as per guidlelines and proceed to methacholine challenge testing.   See instructions for specific recommendations which were reviewed directly with the patient who was given a copy with highlighter outlining the key components.

## 2012-10-18 ENCOUNTER — Ambulatory Visit (HOSPITAL_COMMUNITY)
Admission: RE | Admit: 2012-10-18 | Discharge: 2012-10-18 | Disposition: A | Discharge status: Home / Self Care | Payer: Medicare Other | Source: Ambulatory Visit | Attending: Internal Medicine | Admitting: Internal Medicine

## 2012-10-18 DIAGNOSIS — R059 Cough, unspecified: Secondary | ICD-10-CM | POA: Insufficient documentation

## 2012-10-18 DIAGNOSIS — R05 Cough: Secondary | ICD-10-CM | POA: Insufficient documentation

## 2012-10-18 LAB — PULMONARY FUNCTION TEST

## 2012-10-18 MED ORDER — ALBUTEROL SULFATE (5 MG/ML) 0.5% IN NEBU
2.5000 mg | INHALATION_SOLUTION | Freq: Once | RESPIRATORY_TRACT | Status: AC
  Administered 2012-10-18: 2.5 mg via RESPIRATORY_TRACT

## 2012-10-18 MED ORDER — METHACHOLINE 16 MG/ML NEB SOLN
2.0000 mL | Freq: Once | RESPIRATORY_TRACT | Status: AC
  Administered 2012-10-18: 32 mg via RESPIRATORY_TRACT

## 2012-10-18 MED ORDER — METHACHOLINE 4 MG/ML NEB SOLN
2.0000 mL | Freq: Once | RESPIRATORY_TRACT | Status: AC
  Administered 2012-10-18: 8 mg via RESPIRATORY_TRACT

## 2012-10-18 MED ORDER — METHACHOLINE 0.25 MG/ML NEB SOLN
2.0000 mL | Freq: Once | RESPIRATORY_TRACT | Status: AC
  Administered 2012-10-18: 0.5 mg via RESPIRATORY_TRACT

## 2012-10-18 MED ORDER — METHACHOLINE 1 MG/ML NEB SOLN
2.0000 mL | Freq: Once | RESPIRATORY_TRACT | Status: AC
  Administered 2012-10-18: 2 mg via RESPIRATORY_TRACT

## 2012-10-18 MED ORDER — SODIUM CHLORIDE 0.9 % IN NEBU
3.0000 mL | INHALATION_SOLUTION | Freq: Once | RESPIRATORY_TRACT | Status: AC
  Administered 2012-10-18: 3 mL via RESPIRATORY_TRACT

## 2012-10-18 MED ORDER — METHACHOLINE 0.0625 MG/ML NEB SOLN
2.0000 mL | Freq: Once | RESPIRATORY_TRACT | Status: AC
Start: 2012-10-18 — End: 2012-10-18
  Administered 2012-10-18: 0.125 mg via RESPIRATORY_TRACT

## 2012-10-28 ENCOUNTER — Telehealth: Payer: Self-pay | Admitting: Internal Medicine

## 2012-10-28 NOTE — Telephone Encounter (Signed)
I called to have [facility] faxed over to triage. Will await results

## 2012-10-29 NOTE — Telephone Encounter (Signed)
[facility] test results not located in triage Have called Misty Stanley w/ resp at [facility] to have these faxed again to triage Will await fax

## 2012-10-29 NOTE — Telephone Encounter (Signed)
Fax received and placed in MW's lookat 

## 2012-10-31 NOTE — Telephone Encounter (Signed)
Has this been received and/or taken care of? Please advise. Thanks!

## 2012-10-31 NOTE — Telephone Encounter (Signed)
Prelim has no evidence asthma, final review is pending

## 2012-10-31 NOTE — Telephone Encounter (Signed)
Dr Sherene Sires, the results are in your lookat  Please advise once reviewed thanks

## 2012-11-01 NOTE — Telephone Encounter (Signed)
Called, spoke with pt.  Informed her of below per MW.  She verbalized understanding and voiced no further questions or concerns at this time.

## 2012-11-04 ENCOUNTER — Encounter: Payer: Self-pay | Admitting: Internal Medicine

## 2012-11-06 ENCOUNTER — Encounter: Payer: Self-pay | Admitting: Internal Medicine

## 2012-11-06 ENCOUNTER — Ambulatory Visit (INDEPENDENT_AMBULATORY_CARE_PROVIDER_SITE_OTHER): Payer: Medicare Other | Admitting: Internal Medicine

## 2012-11-06 VITALS — BP 120/70 | HR 82 | Temp 98.0°F | Ht 63.0 in | Wt 123.4 lb

## 2012-11-06 DIAGNOSIS — R059 Cough, unspecified: Secondary | ICD-10-CM

## 2012-11-06 DIAGNOSIS — R05 Cough: Secondary | ICD-10-CM

## 2012-11-06 MED ORDER — TRAMADOL HCL 50 MG PO TABS: ORAL_TABLET | ORAL | Status: DC

## 2012-11-06 NOTE — Assessment & Plan Note (Addendum)
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  Please see instructions for details which were reviewed in writing and the patient given a copy.

## 2012-11-06 NOTE — Progress Notes (Signed)
Subjective:    Patient ID: Jane Griffin, female    DOB: 1940/05/13  MRN: [id number]   Brief patient profile:  28 yowf never smoker with no problem until ? In 60's dx of asthma then 2004 dx of asthma and rec chronic singulair rx but did not maintain it then started on advair daily x 2008 self referred 03/14/2012 for cough x 2011   HPI 03/14/2012 1st pulmonary ov cc daily cough x 3 years  on advair feels choked but minimal mucus and occ vomits. If not coughing breathing is ok.   W/u for gerd by [facility] c/w mild gerd. Some better with mucinex dm. rec Stop prilosec Try Pantoprazole 40 mg  Take 30-60 min before first meal of the day and Pepcid 20 mg one bedtime until  Return Stop advair Take  mucinex dm (guainefisin) 1200mg   every 12 hours and supplement if needed with  tramadol 50 mg up to 1- 2 every 4 hours to suppress the urge to cough.   GERD diet  Prednisone 10 mg take  4 each am x 2 days,   2 each am x 2 days,  1 each am x 2 days and stop  Please schedule a follow up office visit in 4 weeks (ok to schedule at [facility])  sooner if needed - could start singulair back if not improving   04/19/2012 f/u ov/Jane Griffin cc 90% better, still urge to clear her throat. Still taking tramadol 50  One half 3 x daily and whole at bedtime.  states she's thought about it now and singulair was no help rec Start neurontin 100 mg three times a day Take mucinex dm 1200 mg  every 12 hours and supplement if needed with  tramadol 50 mg up to 2 every 4 hours to suppress the urge to cough Once you have eliminated the cough and the urge to clear your throat for 3 straight days try reducing the tramadol first,  then the mucinex dm  tolerated.      05/15/2012 pulmonary f/u ov/ Jane Griffin  Patient presents with  . Follow-up    Cough is some better since started neurontin, she notices the cough is worse if she forgets to take med.   Confused with meds and the difference between mant neurontin and prn tramadol  but does feel better since taking neuronitin  rec Start neurontin 100 mg four times a day and call me with name of the other bedtime pill you are taking as it is not on our list Prednisone 10 mg take  4 each am x 2 days,   2 each am x 2 days,  1 each am x2days and stop  Take mucinex dm 1200 mg  every 12 hours and supplement if needed with  tramadol 50 mg up to 2 every 4 hours to suppress the urge to cough.  Once you have eliminated the cough and the urge to clear your throat for 3 straight days try reducing the tramadol first,  then the mucinex dm  Tolerated.  06/14/2012 f/u ov/Jane Griffin cough x years Jane Griffin  Patient presents with  . Cough    Cough is unchanged, she tried taking gabapentin but still required mucinex and tramadol to control the cough. She took gabapentin for 3 wks and had to d/c med due to having nightmares.   Mostly dry cough is most bothersome in afternoons, sleeps fine. Off neurontin x one week and no change in cough, nightmares resolved. rec Prednisone 10 mg take  4 each am x 2 days,  2 each am x 2 days,  1 each am x2days and stop  Take delsym two tsp every 12 hours and supplement if needed with  tramadol 50 mg up to 2 every 4 hours to suppress the urge to cough.   Once you have eliminated the cough for 3 straight days try reducing the tramadol first,  then the delsym as tolerated.   Add singulair 10 mg one each pm  X one month trial > no better.  Please schedule a follow up office visit in 4 weeks, sooner if needed with all medications including over the counters Add Consider methacholine challenge test, sinus ct, allergy profile next     08/21/2012 f/u ov/Jane Griffin cough x 2011 75% better   Jane Griffin  Patient presents with  . Follow-up    Cough is much improved since her last visit, no new co's  still on tramadol twice daily, still notes in afternoons before supper daily, mostly dry.  rec No change on protonix and pepcid   Prednisone 10 mg take  4 each am x 2  days,   2 each am x 2 days,  1 each am x 2 days and stop  Ok to continue ultram up to  twice daily but use delsym first    10/10/2012 f/u ov/Jane Griffin re chronic cough  Jane Griffin  Patient presents with  . Follow-up    Cough continues to improve, no new co's today.   dust from mowing caused cough, otherwise no obvious trigger or flare since last ov, but still using tramadol bid  rec Please see patient coordinator before you leave today  to schedule methacholine challenge test Add chlortrimeton 4mg  (over the counter) at bedtime automatically to see if helps sleep and drainage   11/06/2012 f/u ov/Jane Griffin re: cough/ pos IgE, neg MCT  Jane Griffin  Patient presents with  . Cough    follow-up. Pt states some days cough is better, but then other days it seems like it is worse.     Needing tramadol qod at most usually in pm and better p chlortrimeton, no sob  No obvious daytime variabilty or assoc sob cp or chest tightness, subjective wheeze overt sinus or hb symptoms. No unusual exp hx or h/o childhood pna/ asthma or premature birth to her knowledge.     Sleeping ok without nocturnal  or early am exacerbation  of respiratory  c/o's or need for noct saba. Also denies any obvious fluctuation of symptoms with weather or environmental changes or other aggravating or alleviating factors except as outlined above.  Current Medications, Allergies, Past Medical History, Past Surgical History, Family History, and Social History were reviewed in [employer] record.  ROS  The following are not active complaints unless bolded sore throat, dysphagia, dental problems, itching, sneezing,  nasal congestion or excess/ purulent secretions, ear ache,   fever, chills, sweats, unintended wt loss, pleuritic or exertional cp, hemoptysis,  orthopnea pnd or leg swelling, presyncope, palpitations, heartburn, abdominal pain, anorexia, nausea, vomiting, diarrhea  or change in bowel or urinary habits,  change in stools or urine, dysuria,hematuria,  rash, arthralgias, visual complaints, headache, numbness weakness or ataxia or problems with walking or coordination,  change in mood/affect or memory.                   Objective:   Physical Exam  amb wf nad but  No longer  throat clearing  04/19/2012  Wt  128  Vs  06/14/2012  126 vs 08/21/2012  125 > 10/10/2012  125 > 11/06/2012  123  Wt Readings from Last 3 Encounters:  03/14/12 132 lb 6.4 oz (60.056 kg)    HEENT: nl dentition, turbinates, and orophanx. Nl external ear canals without cough reflex   NECK :  without JVD/Nodes/TM/ nl carotid upstrokes bilaterally   LUNGS: no acc muscle use, clear to A and P bilaterally without cough on insp or exp maneuvers   CV:  RRR  no s3 or murmur or increase in P2, no edema   ABD:  soft and nontender with nl excursion in the supine position. No bruits or organomegaly, bowel sounds nl  MS:  warm without deformities, calf tenderness, cyanosis or clubbing     CT Chest [place]  Calcified granuloma       Assessment & Plan:   Outpatient Encounter Prescriptions as of 11/06/2012  Medication Sig Dispense Refill  . chlorpheniramine (CHLOR-TRIMETON) 4 MG tablet Take 4 mg by mouth at bedtime.      . famotidine (PEPCID) 20 MG tablet One at bedtime  30 tablet  5  . Fluvastatin Sodium (LESCOL PO) Take 1 tablet by mouth daily.      . hydrochlorothiazide (MICROZIDE) 12.5 MG capsule Take 12.5 mg by mouth once.       . meloxicam (MOBIC) 7.5 MG tablet Take 1 tablet by mouth 2 (two) times daily.      . methocarbamol (ROBAXIN) 750 MG tablet Take 2 tablets by mouth 3 (three) times daily.      . pantoprazole (PROTONIX) 40 MG tablet Take 1 tablet (40 mg total) by mouth daily. Take 30-60 min before first meal of the day  30 tablet  2  . traMADol (ULTRAM) 50 MG tablet TAKE 1 TO 2 TABLETS BY MOUTH EVERY 4 HOURS AS NEEDED COUGH OR PAIN  60 tablet  0  . [DISCONTINUED] traMADol (ULTRAM) 50 MG tablet TAKE 1 TO 2  TABLETS BY MOUTH EVERY 4 HOURS AS NEEDED COUGH OR PAIN  90 tablet  0  . [DISCONTINUED] predniSONE (STERAPRED UNI-PAK) 10 MG tablet Prednisone 10 mg take  4 each am x 2 days,   2 each am x 2 days,  1 each am x2days and stop  14 tablet  0   No facility-administered encounter medications on file as of 11/06/2012.

## 2012-11-06 NOTE — Patient Instructions (Addendum)
Continue the chlortrimeton 12 hour twice daily  Ok to continue to use the tramadol if needed but if you note the need goes up we'll need to see you back here before the 1st of the year  Please schedule a follow up visit in 3 months but call sooner if needed

## 2013-01-23 ENCOUNTER — Other Ambulatory Visit: Payer: Self-pay | Admitting: Internal Medicine

## 2013-02-03 ENCOUNTER — Other Ambulatory Visit: Payer: Self-pay | Admitting: Internal Medicine

## 2013-02-03 MED ORDER — FAMOTIDINE 20 MG PO TABS: ORAL_TABLET | ORAL | Status: DC

## 2013-02-03 MED ORDER — PANTOPRAZOLE SODIUM 40 MG PO TBEC: DELAYED_RELEASE_TABLET | ORAL | Status: AC

## 2013-02-21 ENCOUNTER — Ambulatory Visit: Payer: Medicare Other | Admitting: Internal Medicine

## 2013-03-04 ENCOUNTER — Encounter (INDEPENDENT_AMBULATORY_CARE_PROVIDER_SITE_OTHER): Payer: Self-pay

## 2013-03-04 ENCOUNTER — Encounter: Payer: Self-pay | Admitting: Internal Medicine

## 2013-03-04 ENCOUNTER — Ambulatory Visit (INDEPENDENT_AMBULATORY_CARE_PROVIDER_SITE_OTHER): Payer: Medicare Other | Admitting: Internal Medicine

## 2013-03-04 VITALS — BP 122/76 | HR 95 | Temp 97.9°F | Ht 63.0 in | Wt 127.8 lb

## 2013-03-04 DIAGNOSIS — R059 Cough, unspecified: Secondary | ICD-10-CM

## 2013-03-04 DIAGNOSIS — R05 Cough: Secondary | ICD-10-CM

## 2013-03-04 MED ORDER — PREDNISONE 10 MG PO TABS: ORAL_TABLET | ORAL | Status: DC

## 2013-03-04 MED ORDER — MEPERIDINE HCL 50 MG PO TABS: 50.0000 mg | ORAL_TABLET | ORAL | Status: DC | PRN

## 2013-03-04 MED ORDER — MEPERIDINE HCL 50 MG PO TABS
50.0000 mg | ORAL_TABLET | ORAL | Status: DC | PRN
Start: 2013-03-04 — End: 2015-08-24

## 2013-03-04 MED ORDER — CHLORPHENIRAMINE MALEATE 4 MG PO TABS: 4.0000 mg | ORAL_TABLET | Freq: Every day | ORAL | Status: DC

## 2013-03-04 NOTE — Patient Instructions (Addendum)
Take delsym two tsp every 12 hours and supplement if needed with  Demerol 50 mg one every 4 hours to suppress the urge to cough. Swallowing water or using ice chips/non mint and menthol containing candies (such as lifesavers or sugarless jolly ranchers) are also effective.  You should rest your voice and avoid activities that you know make you cough.  Once you have eliminated the cough for 3 straight days try reducing the demerol  first,  then the delsym as tolerated.    Prednisone 10 mg take  4 each am x 2 days,   2 each am x 2 days,  1 each am x 2 days and stop   Chlortrimeton 12 hour twice daily   Please schedule a follow up office visit in 4 weeks, sooner if needed

## 2013-03-04 NOTE — Progress Notes (Signed)
Subjective:    Patient ID: Jane Griffin, female    DOB: 1940/05/13  MRN: [id number]   Brief patient profile:  28 yowf never smoker with no problem until ? In 60's dx of asthma then 2004 dx of asthma and rec chronic singulair rx but did not maintain it then started on advair daily x 2008 self referred 03/14/2012 for cough x 2011   HPI 03/14/2012 1st pulmonary ov cc daily cough x 3 years  on advair feels choked but minimal mucus and occ vomits. If not coughing breathing is ok.   W/u for gerd by [facility] c/w mild gerd. Some better with mucinex dm. rec Stop prilosec Try Pantoprazole 40 mg  Take 30-60 min before first meal of the day and Pepcid 20 mg one bedtime until  Return Stop advair Take  mucinex dm (guainefisin) 1200mg   every 12 hours and supplement if needed with  tramadol 50 mg up to 1- 2 every 4 hours to suppress the urge to cough.   GERD diet  Prednisone 10 mg take  4 each am x 2 days,   2 each am x 2 days,  1 each am x 2 days and stop  Please schedule a follow up office visit in 4 weeks (ok to schedule at [facility])  sooner if needed - could start singulair back if not improving   04/19/2012 f/u ov/Kramer Hanrahan cc 90% better, still urge to clear her throat. Still taking tramadol 50  One half 3 x daily and whole at bedtime.  states she's thought about it now and singulair was no help rec Start neurontin 100 mg three times a day Take mucinex dm 1200 mg  every 12 hours and supplement if needed with  tramadol 50 mg up to 2 every 4 hours to suppress the urge to cough Once you have eliminated the cough and the urge to clear your throat for 3 straight days try reducing the tramadol first,  then the mucinex dm  tolerated.      05/15/2012 pulmonary f/u ov/ Jette Chief Complaint  Patient presents with  . Follow-up    Cough is some better since started neurontin, she notices the cough is worse if she forgets to take med.   Confused with meds and the difference between mant neurontin and prn tramadol  but does feel better since taking neuronitin  rec Start neurontin 100 mg four times a day and call me with name of the other bedtime pill you are taking as it is not on our list Prednisone 10 mg take  4 each am x 2 days,   2 each am x 2 days,  1 each am x2days and stop  Take mucinex dm 1200 mg  every 12 hours and supplement if needed with  tramadol 50 mg up to 2 every 4 hours to suppress the urge to cough.  Once you have eliminated the cough and the urge to clear your throat for 3 straight days try reducing the tramadol first,  then the mucinex dm  Tolerated.  06/14/2012 f/u ov/Leandro Berkowitz cough x years Chief Complaint  Patient presents with  . Cough    Cough is unchanged, she tried taking gabapentin but still required mucinex and tramadol to control the cough. She took gabapentin for 3 wks and had to d/c med due to having nightmares.   Mostly dry cough is most bothersome in afternoons, sleeps fine. Off neurontin x one week and no change in cough, nightmares resolved. rec Prednisone 10 mg take  4 each am x 2 days,  2 each am x 2 days,  1 each am x2days and stop  Take delsym two tsp every 12 hours and supplement if needed with  tramadol 50 mg up to 2 every 4 hours to suppress the urge to cough.   Once you have eliminated the cough for 3 straight days try reducing the tramadol first,  then the delsym as tolerated.   Add singulair 10 mg one each pm  X one month trial > no better.  Please schedule a follow up office visit in 4 weeks, sooner if needed with all medications including over the counters Add Consider methacholine challenge test, sinus ct, allergy profile next     08/21/2012 f/u ov/Dionne Rossa cough x 2011 75% better   Chief Complaint  Patient presents with  . Follow-up    Cough is much improved since her last visit, no new co's  still on tramadol twice daily, still notes in afternoons before supper daily, mostly dry.  rec No change on protonix and pepcid   Prednisone 10 mg take  4 each am x 2  days,   2 each am x 2 days,  1 each am x 2 days and stop  Ok to continue ultram up to  twice daily but use delsym first    10/10/2012 f/u ov/Latoia Eyster re chronic cough  Chief Complaint  Patient presents with  . Follow-up    Cough continues to improve, no new co's today.   dust from mowing caused cough, otherwise no obvious trigger or flare since last ov, but still using tramadol bid  rec Please see patient coordinator before you leave today  to schedule methacholine challenge test Add chlortrimeton 4mg  (over the counter) at bedtime automatically to see if helps sleep and drainage   11/06/2012 f/u ov/Abdulai Blaylock re: cough/ pos IgE, neg MCT  Chief Complaint  Patient presents with  . Cough    follow-up. Pt states some days cough is better, but then other days it seems like it is worse.     Needing tramadol qod at most usually in pm and better p chlortrimeton, no sob rec Continue the chlortrimeton 12 hour twice daily Ok to continue to use the tramadol if needed   03/04/2013 f/u ov/Makendra Vigeant re: cough x 4 y,  Flared  p vomited / not able to take empirical rx correctly  Chief Complaint  Patient presents with  . Follow-up    Cough worse for the past 6 wks- has been on 2 rounds of abx since last visit. Cough is mainly dry, but occ produces minimal clear sputum.  She states had to d/c chlortrimeton and tramadol due to nausea.   stopped both the h1 and the tramadol early dec 2014 due to vomiting/ nausea / then cough/ then fever rx with zpak then amox / pred / cough meds> cough is worse during the day> minimally productive    No obvious daytime variabilty or assoc sob cp or chest tightness, subjective wheeze overt sinus or hb symptoms. No unusual exp hx or h/o childhood pna/ asthma or premature birth to her knowledge.   Sleeping ok without nocturnal  or early am exacerbation  of respiratory  c/o's or need for noct saba. Also denies any obvious fluctuation of symptoms with weather or environmental changes or  other aggravating or alleviating factors except as outlined above.  Current Medications, Allergies, Past Medical History, Past Surgical History, Family History, and Social History were reviewed in [employer]  medical record.  ROS  The following are not active complaints unless bolded sore throat, dysphagia, dental problems, itching, sneezing,  nasal congestion or excess/ purulent secretions, ear ache,   fever, chills, sweats, unintended wt loss, pleuritic or exertional cp, hemoptysis,  orthopnea pnd or leg swelling, presyncope, palpitations, heartburn, abdominal pain, anorexia, nausea, vomiting, diarrhea  or change in bowel or urinary habits, change in stools or urine, dysuria,hematuria,  rash, arthralgias, visual complaints, headache, numbness weakness or ataxia or problems with walking or coordination,  change in mood/affect or memory.                   Objective:   Physical Exam  amb wf nad but  No    throat clearing  04/19/2012   Wt  128  Vs  06/14/2012  126 vs 08/21/2012  125 > 10/10/2012  125 > 11/06/2012  123 > 03/04/2013  128   HEENT: nl dentition, turbinates, and orophanx. Nl external ear canals without cough reflex   NECK :  without JVD/Nodes/TM/ nl carotid upstrokes bilaterally   LUNGS: no acc muscle use, clear to A and P bilaterally without cough on insp or exp maneuvers   CV:  RRR  no s3 or murmur or increase in P2, no edema   ABD:  soft and nontender with nl excursion in the supine position. No bruits or organomegaly, bowel sounds nl  MS:  warm without deformities, calf tenderness, cyanosis or clubbing     CT Chest [place]  Calcified granuloma       Assessment & Plan:

## 2013-03-06 NOTE — Assessment & Plan Note (Signed)
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    This may partially respond to steroids and look like asthma and post nasal drainage but never erradicated completely unless the cough and the secondary reflux are eliminated, preferably both at the same time.  While not intuitively obvious, many patients with chronic low grade reflux do not cough until there is a secondary insult that disturbs the protective epithelial barrier and exposes sensitive nerve endings.  This can be viral or direct physical injury such as with an endotracheal tube.   The point is that once this occurs, it is difficult to eliminate using anything but a maximally effective acid suppression regimen at least in the short run, accompanied by an appropriate diet to address non acid GERD.   She has not been able to eliminate completely the cyclical coughing to date > rec trial of demerol this time.

## 2013-04-01 ENCOUNTER — Ambulatory Visit: Payer: Medicare Other | Admitting: Internal Medicine

## 2013-04-10 ENCOUNTER — Encounter (INDEPENDENT_AMBULATORY_CARE_PROVIDER_SITE_OTHER): Payer: Self-pay

## 2013-04-10 ENCOUNTER — Ambulatory Visit (INDEPENDENT_AMBULATORY_CARE_PROVIDER_SITE_OTHER): Payer: Medicare Other | Admitting: Internal Medicine

## 2013-04-10 ENCOUNTER — Encounter: Payer: Self-pay | Admitting: Internal Medicine

## 2013-04-10 VITALS — BP 130/80 | HR 79 | Temp 98.0°F | Ht 62.0 in | Wt 127.6 lb

## 2013-04-10 DIAGNOSIS — R059 Cough, unspecified: Secondary | ICD-10-CM

## 2013-04-10 DIAGNOSIS — R05 Cough: Secondary | ICD-10-CM

## 2013-04-10 DIAGNOSIS — R058 Other specified cough: Secondary | ICD-10-CM

## 2013-04-10 NOTE — Assessment & Plan Note (Signed)
-   Trial off advair 03/14/2012 >>> cough better  - Neurontin trial 04/19/12 > nightmares so d/c -Singulair started 06/15/2012 x one month > d/c as no benefit - Allergy profile 10/10/2012 >>> IgE 139 but no specific resp allergens - methacholine challenge  9/121/14 > negative for drop in FEV1 and no cough elicited  - added h1 at hs 10/10/12 > improved 16/1/09 - cyclical cough rx 07/10/52 with demerol > improve  Have excluded all other causes except uacs and note she does the best on 1st gen H1 as one would expect with some breakthru coughing after supper but not after she takes pm dose of h1 > rec move up second dose to 12 h after first, avoid further narcotic cough meds if possbile  See instructions for specific recommendations which were reviewed directly with the patient who was given a copy with highlighter outlining the key components.

## 2013-04-10 NOTE — Progress Notes (Signed)
Subjective:    Patient ID: Jane Griffin, female    DOB: 1940/05/13  MRN: [id number]   Brief patient profile:  28 yowf never smoker with no problem until ? In 60's dx of asthma then 2004 dx of asthma and rec chronic singulair rx but did not maintain it then started on advair daily x 2008 self referred 03/14/2012 for cough x 2011   HPI 03/14/2012 1st pulmonary ov cc daily cough x 3 years  on advair feels choked but minimal mucus and occ vomits. If not coughing breathing is ok.   W/u for gerd by [facility] c/w mild gerd. Some better with mucinex dm. rec Stop prilosec Try Pantoprazole 40 mg  Take 30-60 min before first meal of the day and Pepcid 20 mg one bedtime until  Return Stop advair Take  mucinex dm (guainefisin) 1200mg   every 12 hours and supplement if needed with  tramadol 50 mg up to 1- 2 every 4 hours to suppress the urge to cough.   GERD diet  Prednisone 10 mg take  4 each am x 2 days,   2 each am x 2 days,  1 each am x 2 days and stop  Please schedule a follow up office visit in 4 weeks (ok to schedule at [facility])  sooner if needed - could start singulair back if not improving   04/19/2012 f/u ov/Koby Pickup cc 90% better, still urge to clear her throat. Still taking tramadol 50  One half 3 x daily and whole at bedtime.  states she's thought about it now and singulair was no help rec Start neurontin 100 mg three times a day Take mucinex dm 1200 mg  every 12 hours and supplement if needed with  tramadol 50 mg up to 2 every 4 hours to suppress the urge to cough Once you have eliminated the cough and the urge to clear your throat for 3 straight days try reducing the tramadol first,  then the mucinex dm  tolerated.      05/15/2012 pulmonary f/u ov/ Jette Chief Complaint  Patient presents with  . Follow-up    Cough is some better since started neurontin, she notices the cough is worse if she forgets to take med.   Confused with meds and the difference between mant neurontin and prn tramadol  but does feel better since taking neuronitin  rec Start neurontin 100 mg four times a day and call me with name of the other bedtime pill you are taking as it is not on our list Prednisone 10 mg take  4 each am x 2 days,   2 each am x 2 days,  1 each am x2days and stop  Take mucinex dm 1200 mg  every 12 hours and supplement if needed with  tramadol 50 mg up to 2 every 4 hours to suppress the urge to cough.  Once you have eliminated the cough and the urge to clear your throat for 3 straight days try reducing the tramadol first,  then the mucinex dm  Tolerated.  06/14/2012 f/u ov/Jamieson Hetland cough x years Chief Complaint  Patient presents with  . Cough    Cough is unchanged, she tried taking gabapentin but still required mucinex and tramadol to control the cough. She took gabapentin for 3 wks and had to d/c med due to having nightmares.   Mostly dry cough is most bothersome in afternoons, sleeps fine. Off neurontin x one week and no change in cough, nightmares resolved. rec Prednisone 10 mg take  4 each am x 2 days,  2 each am x 2 days,  1 each am x2days and stop  Take delsym two tsp every 12 hours and supplement if needed with  tramadol 50 mg up to 2 every 4 hours to suppress the urge to cough.   Once you have eliminated the cough for 3 straight days try reducing the tramadol first,  then the delsym as tolerated.   Add singulair 10 mg one each pm  X one month trial > no better.  Please schedule a follow up office visit in 4 weeks, sooner if needed with all medications including over the counters Add Consider methacholine challenge test, sinus ct, allergy profile next     08/21/2012 f/u ov/Keith Cancio cough x 2011 75% better   Chief Complaint  Patient presents with  . Follow-up    Cough is much improved since her last visit, no new co's  still on tramadol twice daily, still notes in afternoons before supper daily, mostly dry.  rec No change on protonix and pepcid   Prednisone 10 mg take  4 each am x 2  days,   2 each am x 2 days,  1 each am x 2 days and stop  Ok to continue ultram up to  twice daily but use delsym first    10/10/2012 f/u ov/Markell Sciascia re chronic cough  Chief Complaint  Patient presents with  . Follow-up    Cough continues to improve, no new co's today.   dust from mowing caused cough, otherwise no obvious trigger or flare since last ov, but still using tramadol bid  rec Please see patient coordinator before you leave today  to schedule methacholine challenge test Add chlortrimeton 4mg  (over the counter) at bedtime automatically to see if helps sleep and drainage   11/06/2012 f/u ov/Brad Mcgaughy re: cough/ pos IgE, neg MCT  Chief Complaint  Patient presents with  . Cough    follow-up. Pt states some days cough is better, but then other days it seems like it is worse.     Needing tramadol qod at most usually in pm and better p chlortrimeton, no sob rec Continue the chlortrimeton 12 hour twice daily Ok to continue to use the tramadol if needed   03/04/2013 f/u ov/Nyala Kirchner re: cough x 4 y,  Flared  p vomited / not able to take empirical rx correctly  Chief Complaint  Patient presents with  . Follow-up    Cough worse for the past 6 wks- has been on 2 rounds of abx since last visit. Cough is mainly dry, but occ produces minimal clear sputum.  She states had to d/c chlortrimeton and tramadol due to nausea.   stopped both the h1 and the tramadol early dec 2014 due to vomiting/ nausea / then cough/ then fever rx with zpak then amox / pred / cough meds> cough is worse during the day> minimally productive  rec Take delsym two tsp every 12 hours and supplement if needed with  Demerol 50 mg one every 4 hours to suppress the urge to cough. Swallowing water or using ice chips/non mint and menthol containing candies (such as lifesavers or sugarless jolly ranchers) are also effective.  You should rest your voice and avoid activities that you know make you cough. Once you have eliminated the cough for 3  straight days try reducing the demerol  first,  then the delsym as tolerated.   Prednisone 10 mg take  4 each am x 2 days,  2 each am x 2 days,  1 each am x 2 days and stop  Chlortrimeton 12 hour twice daily    04/10/2013 f/u ov/Mayte Diers re: uacs better since adding chlortrimeton but takes second dose at hs p am dose @ 6  Chief Complaint  Patient presents with  . Follow-up    Pt states that her cough has improved some since her last visit. No new co's today.    Some cough p supper but not at hs or in am Not limited by breathing from desired activities     No obvious daytime variabilty or assoc sob cp or chest tightness, subjective wheeze overt sinus or hb symptoms. No unusual exp hx or h/o childhood pna/ asthma or premature birth to her knowledge.   Sleeping ok without nocturnal  or early am exacerbation  of respiratory  c/o's or need for noct saba. Also denies any obvious fluctuation of symptoms with weather or environmental changes or other aggravating or alleviating factors except as outlined above.  Current Medications, Allergies, Past Medical History, Past Surgical History, Family History, and Social History were reviewed in [employer] record.  ROS  The following are not active complaints unless bolded sore throat, dysphagia, dental problems, itching, sneezing,  nasal congestion or excess/ purulent secretions, ear ache,   fever, chills, sweats, unintended wt loss, pleuritic or exertional cp, hemoptysis,  orthopnea pnd or leg swelling, presyncope, palpitations, heartburn, abdominal pain, anorexia, nausea, vomiting, diarrhea  or change in bowel or urinary habits, change in stools or urine, dysuria,hematuria,  rash, arthralgias, visual complaints, headache, numbness weakness or ataxia or problems with walking or coordination,  change in mood/affect or memory.                   Objective:   Physical Exam  amb wf nad    04/19/2012   Wt  128  Vs  06/14/2012  126 vs  08/21/2012  125 > 10/10/2012  125 > 11/06/2012  123 > 03/04/2013  128  > 04/10/2013 128    HEENT: nl dentition, turbinates, and orophanx. Nl external ear canals without cough reflex   NECK :  without JVD/Nodes/TM/ nl carotid upstrokes bilaterally   LUNGS: no acc muscle use, clear to A and P bilaterally without cough on insp or exp maneuvers   CV:  RRR  no s3 or murmur or increase in P2, no edema   ABD:  soft and nontender with nl excursion in the supine position. No bruits or organomegaly, bowel sounds nl  MS:  warm without deformities, calf tenderness, cyanosis or clubbing     CT Chest [place]  Calcified granuloma       Assessment & Plan:

## 2013-04-10 NOTE — Patient Instructions (Signed)
Change chlortrimeton to where you take it first thing in am and at supper   If you are satisfied with your treatment plan let your doctor know and he  can either refill your medications or you can return here when your prescription runs out.     If in any way you are not 100% satisfied,  please tell us.  If 100% better, tell your friends!

## 2013-04-16 ENCOUNTER — Ambulatory Visit: Payer: Self-pay | Admitting: Unknown Physician Specialty

## 2013-05-27 ENCOUNTER — Ambulatory Visit: Payer: Self-pay | Admitting: Internal Medicine

## 2014-06-02 ENCOUNTER — Ambulatory Visit
Admit: 2014-06-02 | Disposition: A | Discharge status: Home / Self Care | Payer: Self-pay | Attending: Internal Medicine | Admitting: Internal Medicine

## 2015-05-10 DIAGNOSIS — Z0000 Encounter for general adult medical examination without abnormal findings: Secondary | ICD-10-CM | POA: Insufficient documentation

## 2015-05-11 ENCOUNTER — Other Ambulatory Visit: Payer: Self-pay | Admitting: Internal Medicine

## 2015-05-11 DIAGNOSIS — Z1231 Encounter for screening mammogram for malignant neoplasm of breast: Secondary | ICD-10-CM

## 2015-06-03 ENCOUNTER — Other Ambulatory Visit: Payer: Self-pay | Admitting: Internal Medicine

## 2015-06-03 ENCOUNTER — Ambulatory Visit
Admission: RE | Admit: 2015-06-03 | Discharge: 2015-06-03 | Disposition: A | Discharge status: Home / Self Care | Payer: Medicare Other | Source: Ambulatory Visit | Attending: Internal Medicine | Admitting: Internal Medicine

## 2015-06-03 DIAGNOSIS — Z1231 Encounter for screening mammogram for malignant neoplasm of breast: Secondary | ICD-10-CM | POA: Insufficient documentation

## 2015-06-16 ENCOUNTER — Ambulatory Visit
Admission: RE | Admit: 2015-06-16 | Discharge: 2015-06-16 | Disposition: A | Discharge status: Home / Self Care | Payer: Medicare Other | Source: Ambulatory Visit | Attending: General Surgery | Admitting: General Surgery

## 2015-06-16 ENCOUNTER — Encounter: Payer: Self-pay | Admitting: General Surgery

## 2015-06-16 ENCOUNTER — Ambulatory Visit: Admission: RE | Admit: 2015-06-16 | Payer: Medicare Other | Source: Ambulatory Visit

## 2015-06-16 ENCOUNTER — Ambulatory Visit (INDEPENDENT_AMBULATORY_CARE_PROVIDER_SITE_OTHER): Payer: Medicare Other | Admitting: General Surgery

## 2015-06-16 VITALS — BP 124/72 | HR 72 | Resp 12 | Ht 62.0 in | Wt 122.0 lb

## 2015-06-16 DIAGNOSIS — K439 Ventral hernia without obstruction or gangrene: Secondary | ICD-10-CM | POA: Insufficient documentation

## 2015-06-16 DIAGNOSIS — N838 Other noninflammatory disorders of ovary, fallopian tube and broad ligament: Secondary | ICD-10-CM

## 2015-06-16 DIAGNOSIS — R748 Abnormal levels of other serum enzymes: Secondary | ICD-10-CM

## 2015-06-16 DIAGNOSIS — R1903 Right lower quadrant abdominal swelling, mass and lump: Secondary | ICD-10-CM | POA: Insufficient documentation

## 2015-06-16 DIAGNOSIS — R7989 Other specified abnormal findings of blood chemistry: Secondary | ICD-10-CM

## 2015-06-16 DIAGNOSIS — N839 Noninflammatory disorder of ovary, fallopian tube and broad ligament, unspecified: Secondary | ICD-10-CM | POA: Diagnosis not present

## 2015-06-16 LAB — POCT I-STAT CREATININE: CREATININE: 2.2 mg/dL — AB (ref 0.44–1.00)

## 2015-06-16 MED ORDER — IOPAMIDOL (ISOVUE-300) INJECTION 61%: 85.0000 mL | Freq: Once | INTRAVENOUS | Status: DC | PRN

## 2015-06-16 NOTE — Progress Notes (Signed)
Patient ID: Jane Griffin, female   DOB: 11/29/1940, 75 y.o.   MRN: [id number]  Chief Complaint  Patient presents with  . Other    umbilical hernia    HPI Jane Griffin is a 75 y.o. female here today for a evaluation of a suspected umbilical hernia. She states she has been having pain at her umbilical area for about a week now and recently noticed a mass at the area. This has not changed in size. The nausea, vomiting and diarrhea present several days ago has resolved, but the mass is unchanged. The patient reports she has had episodic nausea followed by vomiting and diarrhea for the last several months. The worst episode was in March when it lasted for 5 days. No previous episodes prior to January 1.  The patient reports no unusual exposure, change in diet or previous similar episodes.  The patient was seen by Manus Rudd, PA today and referred for assessment.  I personally reviewed the patient's history. HPI  Past Medical History  Diagnosis Date  . Hypertension   . H/O blood clots     around her 22's  . Allergic rhinitis   . Hyperlipidemia   . GERD (gastroesophageal reflux disease)     Past Surgical History  Procedure Laterality Date  . Partial hysterectomy    . Elbow surgery Left   . Wrist surgery Right   . Colonoscopy  2013  . Tubal ligation      Family History  Problem Relation Age of Onset  . Clotting disorder Mother   . Heart failure Mother   . Emphysema Father   . Heart failure Father   . Breast cancer Daughter 31    Social History Social History  Substance Use Topics  . Smoking status: Never Smoker   . Smokeless tobacco: Never Used  . Alcohol Use: No    Allergies  Allergen Reactions  . Codeine Nausea Only  . Gabapentin     nightmares  . Sulfa Antibiotics   . Tramadol     nausea    Current Outpatient Prescriptions  Medication Sig Dispense Refill  . aspirin EC 81 MG tablet Take by mouth.    . famotidine (PEPCID) 20 MG tablet One at bedtime 90  tablet 1  . Fluvastatin Sodium (LESCOL PO) Take 1 tablet by mouth daily.    Marland Kitchen losartan (COZAAR) 100 MG tablet TAKE 1 TABLET (100 MG TOTAL) BY MOUTH ONCE DAILY.  3  . pantoprazole (PROTONIX) 40 MG tablet TAKE 1 TABLET BY MOUTH ONCE A DAY 30-60 MINUTES BEFORE FIRST MEAL OF THE DAY 90 tablet 1  . chlorpheniramine (CHLOR-TRIMETON) 4 MG tablet Take 1 tablet (4 mg total) by mouth at bedtime. (Patient not taking: Reported on 06/16/2015)    . dextromethorphan (DELSYM) 30 MG/5ML liquid Take 30 mg by mouth as needed for cough. Reported on 06/16/2015    . hydrochlorothiazide (MICROZIDE) 12.5 MG capsule Take 12.5 mg by mouth once. Reported on 06/16/2015    . meperidine (DEMEROL) 50 MG tablet Take 1 tablet (50 mg total) by mouth every 4 (four) hours as needed (for breakthru cough on delsym). (Patient not taking: Reported on 06/16/2015) 40 tablet 0   No current facility-administered medications for this visit.   Facility-Administered Medications Ordered in Other Visits  Medication Dose Route Frequency Provider Last Rate Last Dose  . iopamidol (ISOVUE-300) 61 % injection 85 mL  85 mL Intravenous Once PRN Robert Bellow, MD  Review of Systems Review of Systems  Constitutional: Positive for chills and unexpected weight change (a proximally 5 pounds over the last several months related to decreased appetite and oral intake.).  HENT: Negative.   Eyes: Negative.   Respiratory: Negative.   Cardiovascular: Negative.   Gastrointestinal: Positive for vomiting, abdominal pain and diarrhea.  Endocrine: Negative.   Genitourinary: Negative.   Allergic/Immunologic: Negative.   Neurological: Negative.   Hematological: Negative.   Psychiatric/Behavioral: Negative.     Blood pressure 124/72, pulse 72, resp. rate 12, height 5\' 2"  (1.575 m), weight 122 lb (55.339 kg).  Physical Exam Physical Exam  Constitutional: She is oriented to person, place, and time. She appears well-developed and well-nourished.   HENT:  Head: Normocephalic and atraumatic.  Eyes: Conjunctivae are normal. No scleral icterus.  Neck: Neck supple. No thyromegaly present.  Cardiovascular: Normal rate, regular rhythm and normal heart sounds.   Pulmonary/Chest: Effort normal and breath sounds normal.  Abdominal: Soft. Normal appearance and bowel sounds are normal. There is no hepatomegaly. There is tenderness ( 2 cm lump just below the belly button.) in the periumbilical area. A hernia is present.    Musculoskeletal: Normal range of motion.  Lymphadenopathy:    She has no cervical adenopathy.       Right: No inguinal adenopathy present.       Left: No inguinal adenopathy present.  Neurological: She is alert and oriented to person, place, and time.  Skin: Skin is warm and dry.    Data Reviewed Creatinine obtained prior to CT was elevated at 2.2. Previous high at 2.6. Most recent value 1.5.  CT of the abdomen and pelvis obtained today showed evidence of incarcerated fat in the infraumbilical area consistent with a hernia. Also noted was a complex mass involving the right ovary. No ascites or intra-abdominal adenopathy appreciated. Findings reviewed with the radiologist by phone.  Assessment    Incarcerated omentum at the site of the previous tubal ligation incision.  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  Episodic nausea, vomiting and diarrhea unexplained by today's CT scan.    Plan    The patient shows no evidence of obstruction on history, clinical exam or CT imaging. The discomfort at the umbilicus should improve over the next several days as the inflammatory process resolves. Prior to surgical repair of this defect GYN evaluation will be important to determine timing for right ovarian removal. She has had a previous hysterectomy and the lesion sits moderately high making benefit of transabdominal or transvaginal ultrasound less assuredly beneficial. Final decision in this regard will be left  to the gynecology service. The radiologist raised the question regarding MRI of the lesion. If surgical excision is going to be undertaken, are not sure an MRI will remove the ball down the court.    Patient has been scheduled for a CT abdomen/pelvis without contrast (oral contrast only) at [place] for today. This patient has been instructed not to have anything else to eat or drink. Patient verbalizes understanding. She will return to the office once CT scan is complete. She will be seeing a OBGYN at [facility].   This patient has been scheduled for an appointment with Dr. Laverta Baltimore at [place] for 06-17-15 at 2 pm. She is aware of date, time, and instructions.   PCP:  Ouida Sills This information has been scribed by Gaspar Cola CMA.    Robert Bellow 06/16/2015, 8:53 PM

## 2015-06-16 NOTE — Patient Instructions (Addendum)
Patient to have a CT scan and will be seeing a OBGYN at [facility].

## 2015-08-23 NOTE — H&P (Signed)
Jane Griffin is a 75 y.o. female here for Discuss sono .pt is being followed for a complex right ovarian cyst . Serial ca-125 and u/s have not showed any interval change . Last CA-125 on 07/28/15 = 13.7 U/S today complex cyst with septations = 5.6x 3.77x 5.04 cm  Largest septum 2.7 mm Pt is without pain .  Volume = 54 cm3 , Morphology complexity =2  ; Total index score of 4  after full counseling has opted for l/s BSO  Past Medical History:  has a past medical history of Asthma (2010); Cervical disc disease; Chronic back pain; Chronic cough; GERD (gastroesophageal reflux disease); HTN (hypertension); Hyperlipidemia; Phlebitis; and Shingles.  Past Surgical History:  has a past surgical history that includes Hysterectomy (1983); Elbow surgery; Colonoscopy (01/25/2011 Dr Ouida Sills); Upper gastrointestinal endoscopy (2010); Upper gastrointestinal endoscopy (bravo 2004); Hernia surgery repair; and Right wrist hardware placed. Family History: family history includes Diabetes mellitus in her mother; Heart attack in her father; Hypertension in her father and mother; Pulmonary embolism in her mother. Social History:  reports that she has never smoked. She has never used smokeless tobacco. She reports that she does not drink alcohol or use illicit drugs. OB/GYN History:  OB History    Gravida Para Term Preterm AB TAB SAB Ectopic Multiple Living   2 2 2       2       Allergies: is allergic to advil [ibuprofen]; codeine; lescol [fluvastatin]; lipitor [atorvastatin]; pravachol [pravastatin]; and sulfa (sulfonamide antibiotics). Medications:  Current Outpatient Prescriptions:  .  albuterol 90 mcg/actuation inhaler, Inhale 2 inhalations into the lungs every 6 (six) hours as needed for Wheezing., Disp: 1 Inhaler, Rfl: 12 .  aspirin 81 MG EC tablet, Take 81 mg by mouth once daily., Disp: , Rfl:  .  azelastine-fluticasone (DYMISTA) 137-50 mcg/spray Spry, 137 mcg by Nasal route 2 (two) times daily.,  Disp: 0.137 g, Rfl: 4 .  fluvastatin XL (LESCOL XL) 80 mg XL tablet, TAKE 1 TABLET BY MOUTH EVERY DAY, Disp: 90 tablet, Rfl: 2 .  losartan (COZAAR) 100 MG tablet, Take 1 tablet (100 mg total) by mouth once daily., Disp: 90 tablet, Rfl: 3 .  pantoprazole (PROTONIX) 40 MG DR tablet, TAKE 1 TABLET (40 MG TOTAL) BY MOUTH ONCE DAILY., Disp: 90 tablet, Rfl: 1  Review of Systems: General:                                          No fatigue or weight loss Eyes:                                                         No vision changes Ears:                                                          No hearing difficulty Respiratory:                No cough or shortness of breath Pulmonary:  No asthma or shortness of breath Cardiovascular:                     No chest pain, palpitations, dyspnea on exertion Gastrointestinal:                    No abdominal bloating, chronic diarrhea, constipations, masses, pain or hematochezia Genitourinary:                                 No hematuria, dysuria, abnormal vaginal discharge, pelvic pain, Menometrorrhagia Lymphatic:                                       No swollen lymph nodes Musculoskeletal:                   No muscle weakness Neurologic:                                      No extremity weakness, syncope, seizure disorder Psychiatric:                                      No history of depression, delusions or suicidal/homicidal ideation    Exam:      Vitals:   08/18/15 1605  BP: (!) 136/98  Pulse: 75    Body mass index is 21.95 kg/(m^2).  WDWN white/  female in NAD   Lungs: CTA  CV : RRR without murmur   Abd: soft non distended , NT  pelvic : vag ; nl cull well healed  Adnexa: no mass  NT   Impression:   The primary encounter diagnosis was Right ovarian cyst. Diagnoses of Mass, ovarian and Postmenopausal were also pertinent to this visit.  No interval change and pt is asymptomatic    Plan:  I offer and additonal ca125 today and in 2 months along with an additional u/s in 2 months . Pt expressed concern that she may want to undergo L/S BSO . We talked about the risks of surgery and she is going to go home and think about it .  After fully counseling pt opts for L/S BSO           Orders Placed This Encounter  Procedures  . US pelvic transvaginal    [facility]    Standing Status:   Future    Standing Expiration Date:   08/18/2016    Order Specific Question:   Reason for Exam:    Answer:   right complex ovarian mass  . CA 125, Serum (Serial) - [facility]  . CA 125, Serum (Serial) - [facility]    Standing Status:   Future    Standing Expiration Date:   08/17/2016    No Follow-up on file.  Caroline Sauger, MD       Instructions      Info:    Classic Report,    Anticoag,    Billing,    Dialysis History,    Med Rec,    Patient Screenings,    Patient Entered Data,    Smoking Audit    .Marland Kitchen.(6 more)  Orders Placed

## 2015-08-27 ENCOUNTER — Encounter
Admission: RE | Admit: 2015-08-27 | Discharge: 2015-08-27 | Disposition: A | Discharge status: Home / Self Care | Payer: Medicare Other | Source: Ambulatory Visit | Attending: Obstetrics and Gynecology | Admitting: Obstetrics and Gynecology

## 2015-08-27 ENCOUNTER — Other Ambulatory Visit: Payer: Self-pay

## 2015-08-27 DIAGNOSIS — Z01812 Encounter for preprocedural laboratory examination: Secondary | ICD-10-CM | POA: Insufficient documentation

## 2015-08-27 DIAGNOSIS — Z01810 Encounter for preprocedural cardiovascular examination: Secondary | ICD-10-CM | POA: Insufficient documentation

## 2015-08-27 HISTORY — DX: Nausea with vomiting, unspecified: R11.2

## 2015-08-27 HISTORY — DX: Cervical disc disorder, unspecified, unspecified cervical region: M50.90

## 2015-08-27 HISTORY — DX: Zoster without complications: B02.9

## 2015-08-27 HISTORY — DX: Other specified postprocedural states: Z98.890

## 2015-08-27 HISTORY — DX: Other specified postprocedural states: R11.2

## 2015-08-27 LAB — CBC
HCT: 35 % (ref 35.0–47.0)
Hemoglobin: 12.1 g/dL (ref 12.0–16.0)
MCH: 30 pg (ref 26.0–34.0)
MCHC: 34.5 g/dL (ref 32.0–36.0)
MCV: 86.8 fL (ref 80.0–100.0)
PLATELETS: 230 10*3/uL (ref 150–440)
RBC: 4.03 MIL/uL (ref 3.80–5.20)
RDW: 12.9 % (ref 11.5–14.5)
WBC: 6.1 10*3/uL (ref 3.6–11.0)

## 2015-08-27 LAB — BASIC METABOLIC PANEL
Anion gap: 8 (ref 5–15)
BUN: 26 mg/dL — AB (ref 6–20)
CALCIUM: 9.1 mg/dL (ref 8.9–10.3)
CHLORIDE: 107 mmol/L (ref 101–111)
CO2: 25 mmol/L (ref 22–32)
CREATININE: 1.28 mg/dL — AB (ref 0.44–1.00)
GFR calc Af Amer: 47 mL/min — ABNORMAL LOW (ref >60)
GFR calc non Af Amer: 40 mL/min — ABNORMAL LOW (ref >60)
Glucose, Bld: 88 mg/dL (ref 65–99)
Potassium: 3.8 mmol/L (ref 3.5–5.1)
Sodium: 140 mmol/L (ref 135–145)

## 2015-08-27 LAB — TYPE AND SCREEN
ABO/RH(D): O POS
Antibody Screen: NEGATIVE

## 2015-08-27 NOTE — Patient Instructions (Signed)
Your procedure is scheduled on: Monday 09/06/15 Report to Day Surgery. 2nd floor medical mall entrance To find out your arrival time please call [phone] between 1PM - 3PM on Friday 09/03/15.  Remember: Instructions that are not followed completely may result in serious medical risk, up to and including death, or upon the discretion of your surgeon and anesthesiologist your surgery may need to be rescheduled.    __X__ 1. Do not eat food or drink liquids after midnight. No gum chewing or hard candies.     __X__ 2. No Alcohol for 24 hours before or after surgery.   ____ 3. Bring all medications with you on the day of surgery if instructed.    __X__ 4. Notify your doctor if there is any change in your medical condition     (cold, fever, infections).     Do not wear jewelry, make-up, hairpins, clips or nail polish.  Do not wear lotions, powders, or perfumes.   Do not shave 48 hours prior to surgery. Men may shave face and neck.  Do not bring valuables to the hospital.    [facility] is not responsible for any belongings or valuables.               Contacts, dentures or bridgework may not be worn into surgery.  Leave your suitcase in the car. After surgery it may be brought to your room.  For patients admitted to the hospital, discharge time is determined by your                treatment team.   Patients discharged the day of surgery will not be allowed to drive home.   Please read over the following fact sheets that you were given:   Surgical Site Infection Prevention   __X__ Take these medicines the morning of surgery with A SIP OF WATER:    1. FLUVASTATIN  2. LOSARTAN  3. PANTOPRAZOLE  4.  5.  6.  ____ Fleet Enema (as directed)   __X__ Use CHG Soap as directed  __X__ Use inhalers on the day of surgery  ____ Stop metformin 2 days prior to surgery    ____ Take 1/2 of usual insulin dose the night before surgery and none on the morning of surgery.   __X__ Stop  Coumadin/Plavix/aspirin on TODAY  ____ Stop Anti-inflammatories on    ____ Stop supplements until after surgery.    ____ Bring C-Pap to the hospital.   __X__ Practice Incentive Spirometer and bring back day of surgery

## 2015-08-30 ENCOUNTER — Encounter: Payer: Self-pay | Admitting: *Deleted

## 2015-08-30 NOTE — Pre-Procedure Instructions (Signed)
LABS COMPARED WITH PREVIOUS AND NOTED BY PCP DR Ouida Sills CKD STAGE 3

## 2015-09-06 ENCOUNTER — Encounter: Payer: Self-pay | Admitting: *Deleted

## 2015-09-06 ENCOUNTER — Ambulatory Visit: Payer: Medicare Other | Admitting: Anesthesiology

## 2015-09-06 ENCOUNTER — Encounter
Admission: RE | Disposition: A | Discharge status: Home / Self Care | Payer: Self-pay | Source: Ambulatory Visit | Attending: Obstetrics and Gynecology

## 2015-09-06 ENCOUNTER — Ambulatory Visit
Admission: RE | Admit: 2015-09-06 | Discharge: 2015-09-06 | Disposition: A | Discharge status: Home / Self Care | Payer: Medicare Other | Source: Ambulatory Visit | Attending: Obstetrics and Gynecology | Admitting: Obstetrics and Gynecology

## 2015-09-06 DIAGNOSIS — G8929 Other chronic pain: Secondary | ICD-10-CM | POA: Insufficient documentation

## 2015-09-06 DIAGNOSIS — Z7982 Long term (current) use of aspirin: Secondary | ICD-10-CM | POA: Diagnosis not present

## 2015-09-06 DIAGNOSIS — N83291 Other ovarian cyst, right side: Secondary | ICD-10-CM | POA: Diagnosis present

## 2015-09-06 DIAGNOSIS — D270 Benign neoplasm of right ovary: Secondary | ICD-10-CM | POA: Diagnosis not present

## 2015-09-06 DIAGNOSIS — I129 Hypertensive chronic kidney disease with stage 1 through stage 4 chronic kidney disease, or unspecified chronic kidney disease: Secondary | ICD-10-CM | POA: Diagnosis not present

## 2015-09-06 DIAGNOSIS — Z79899 Other long term (current) drug therapy: Secondary | ICD-10-CM | POA: Insufficient documentation

## 2015-09-06 DIAGNOSIS — N736 Female pelvic peritoneal adhesions (postinfective): Secondary | ICD-10-CM | POA: Diagnosis not present

## 2015-09-06 DIAGNOSIS — N183 Chronic kidney disease, stage 3 (moderate): Secondary | ICD-10-CM | POA: Insufficient documentation

## 2015-09-06 DIAGNOSIS — N838 Other noninflammatory disorders of ovary, fallopian tube and broad ligament: Secondary | ICD-10-CM | POA: Diagnosis not present

## 2015-09-06 DIAGNOSIS — E785 Hyperlipidemia, unspecified: Secondary | ICD-10-CM | POA: Diagnosis not present

## 2015-09-06 DIAGNOSIS — M5080 Other cervical disc disorders, unspecified cervical region: Secondary | ICD-10-CM | POA: Diagnosis not present

## 2015-09-06 DIAGNOSIS — J45909 Unspecified asthma, uncomplicated: Secondary | ICD-10-CM | POA: Insufficient documentation

## 2015-09-06 DIAGNOSIS — K219 Gastro-esophageal reflux disease without esophagitis: Secondary | ICD-10-CM | POA: Diagnosis not present

## 2015-09-06 HISTORY — DX: Chronic kidney disease, unspecified: N18.9

## 2015-09-06 HISTORY — PX: LAPAROSCOPIC BILATERAL SALPINGO OOPHERECTOMY: SHX5890

## 2015-09-06 SURGERY — SALPINGO-OOPHORECTOMY, BILATERAL, LAPAROSCOPIC
Anesthesia: General | Laterality: Bilateral

## 2015-09-06 MED ORDER — OXYCODONE HCL 5 MG/5ML PO SOLN: 5.0000 mg | Freq: Once | ORAL | Status: DC | PRN

## 2015-09-06 MED ORDER — LABETALOL HCL 5 MG/ML IV SOLN
INTRAVENOUS | Status: DC | PRN
  Administered 2015-09-06 (×2): 2.5 mg via INTRAVENOUS

## 2015-09-06 MED ORDER — LACTATED RINGERS IV SOLN
INTRAVENOUS | Status: DC
  Administered 2015-09-06 (×2): via INTRAVENOUS

## 2015-09-06 MED ORDER — LIDOCAINE HCL (CARDIAC) 20 MG/ML IV SOLN
INTRAVENOUS | Status: DC | PRN
  Administered 2015-09-06: 50 mg via INTRATRACHEAL

## 2015-09-06 MED ORDER — PROMETHAZINE HCL 25 MG/ML IJ SOLN
6.2500 mg | INTRAMUSCULAR | Status: DC | PRN
  Administered 2015-09-06: 6.25 mg via INTRAVENOUS

## 2015-09-06 MED ORDER — MEPERIDINE HCL 25 MG/ML IJ SOLN: 6.2500 mg | INTRAMUSCULAR | Status: DC | PRN

## 2015-09-06 MED ORDER — SCOPOLAMINE 1 MG/3DAYS TD PT72
MEDICATED_PATCH | TRANSDERMAL | Status: AC
  Filled 2015-09-06: qty 1

## 2015-09-06 MED ORDER — HYDRALAZINE HCL 20 MG/ML IJ SOLN
INTRAMUSCULAR | Status: AC
  Filled 2015-09-06: qty 1

## 2015-09-06 MED ORDER — FENTANYL CITRATE (PF) 250 MCG/5ML IJ SOLN
INTRAMUSCULAR | Status: DC | PRN
  Administered 2015-09-06 (×2): 50 ug via INTRAVENOUS
  Administered 2015-09-06: 25 ug via INTRAVENOUS

## 2015-09-06 MED ORDER — LACTATED RINGERS IV SOLN
INTRAVENOUS | Status: DC
  Administered 2015-09-06: 14:00:00 via INTRAVENOUS

## 2015-09-06 MED ORDER — PROPOFOL 10 MG/ML IV BOLUS
INTRAVENOUS | Status: DC | PRN
  Administered 2015-09-06: 30 ug via INTRAVENOUS
  Administered 2015-09-06: 50 ug via INTRAVENOUS
  Administered 2015-09-06: 100 ug via INTRAVENOUS
  Administered 2015-09-06: 20 ug via INTRAVENOUS

## 2015-09-06 MED ORDER — ONDANSETRON HCL 4 MG/2ML IJ SOLN
INTRAMUSCULAR | Status: DC | PRN
  Administered 2015-09-06: 4 mg via INTRAVENOUS

## 2015-09-06 MED ORDER — HYDRALAZINE HCL 20 MG/ML IJ SOLN
10.0000 mg | Freq: Once | INTRAMUSCULAR | Status: AC
  Administered 2015-09-06: 10 mg via INTRAVENOUS

## 2015-09-06 MED ORDER — FENTANYL CITRATE (PF) 100 MCG/2ML IJ SOLN: 25.0000 ug | INTRAMUSCULAR | Status: DC | PRN

## 2015-09-06 MED ORDER — BUPIVACAINE HCL 0.5 % IJ SOLN
INTRAMUSCULAR | Status: DC | PRN
  Administered 2015-09-06: 9 mL

## 2015-09-06 MED ORDER — NEOSTIGMINE METHYLSULFATE 10 MG/10ML IV SOLN
INTRAVENOUS | Status: DC | PRN
  Administered 2015-09-06: 4 mg via INTRAVENOUS

## 2015-09-06 MED ORDER — OXYCODONE HCL 5 MG PO TABS: 5.0000 mg | ORAL_TABLET | Freq: Once | ORAL | Status: DC | PRN

## 2015-09-06 MED ORDER — ROCURONIUM BROMIDE 100 MG/10ML IV SOLN
INTRAVENOUS | Status: DC | PRN
  Administered 2015-09-06: 30 mg via INTRAVENOUS

## 2015-09-06 MED ORDER — MIDAZOLAM HCL 2 MG/2ML IJ SOLN
INTRAMUSCULAR | Status: DC | PRN
  Administered 2015-09-06: 1 mg via INTRAVENOUS

## 2015-09-06 MED ORDER — SCOPOLAMINE 1 MG/3DAYS TD PT72
1.0000 | MEDICATED_PATCH | Freq: Once | TRANSDERMAL | Status: DC
  Administered 2015-09-06: 1.5 mg via TRANSDERMAL

## 2015-09-06 MED ORDER — DEXAMETHASONE SODIUM PHOSPHATE 10 MG/ML IJ SOLN
INTRAMUSCULAR | Status: DC | PRN
  Administered 2015-09-06: 8 mg via INTRAVENOUS

## 2015-09-06 MED ORDER — BUPIVACAINE HCL (PF) 0.5 % IJ SOLN
INTRAMUSCULAR | Status: AC
  Filled 2015-09-06: qty 30

## 2015-09-06 MED ORDER — PROMETHAZINE HCL 25 MG/ML IJ SOLN
INTRAMUSCULAR | Status: AC
  Filled 2015-09-06: qty 1

## 2015-09-06 MED ORDER — GLYCOPYRROLATE 0.2 MG/ML IJ SOLN
INTRAMUSCULAR | Status: DC | PRN
  Administered 2015-09-06: .6 mg via INTRAVENOUS

## 2015-09-06 SURGICAL SUPPLY — 35 items
BAG URO DRAIN 2000ML W/SPOUT (MISCELLANEOUS) ×2 IMPLANT
BLADE SURG SZ11 CARB STEEL (BLADE) ×2 IMPLANT
CANISTER SUCT 1200ML W/VALVE (MISCELLANEOUS) ×2 IMPLANT
CATH ROBINSON RED A/P 16FR (CATHETERS) ×2 IMPLANT
CHLORAPREP W/TINT 26ML (MISCELLANEOUS) ×2 IMPLANT
DRSG TEGADERM 2-3/8X2-3/4 SM (GAUZE/BANDAGES/DRESSINGS) ×2 IMPLANT
ENDOPOUCH RETRIEVER 10 (MISCELLANEOUS) ×2 IMPLANT
GAUZE SPONGE NON-WVN 2X2 STRL (MISCELLANEOUS) ×1 IMPLANT
GLOVE BIO SURGEON STRL SZ8 (GLOVE) ×4 IMPLANT
GOWN STRL REUS W/ TWL LRG LVL3 (GOWN DISPOSABLE) ×2 IMPLANT
GOWN STRL REUS W/ TWL XL LVL3 (GOWN DISPOSABLE) ×1 IMPLANT
GOWN STRL REUS W/TWL LRG LVL3 (GOWN DISPOSABLE) ×2
GOWN STRL REUS W/TWL XL LVL3 (GOWN DISPOSABLE) ×1
GRASPER SUT TROCAR 14GX15 (MISCELLANEOUS) ×2 IMPLANT
IRRIGATION STRYKERFLOW (MISCELLANEOUS) ×1 IMPLANT
IRRIGATOR STRYKERFLOW (MISCELLANEOUS) ×2
IV NS 1000ML (IV SOLUTION) ×1
IV NS 1000ML BAXH (IV SOLUTION) ×1 IMPLANT
KIT RM TURNOVER CYSTO AR (KITS) ×2 IMPLANT
LABEL OR SOLS (LABEL) ×2 IMPLANT
NS IRRIG 500ML POUR BTL (IV SOLUTION) ×2 IMPLANT
PACK GYN LAPAROSCOPIC (MISCELLANEOUS) ×2 IMPLANT
PAD OB MATERNITY 4.3X12.25 (PERSONAL CARE ITEMS) ×2 IMPLANT
PAD PREP 24X41 OB/GYN DISP (PERSONAL CARE ITEMS) ×2 IMPLANT
SHEARS HARMONIC ACE PLUS 36CM (ENDOMECHANICALS) ×2 IMPLANT
SPONGE VERSALON 2X2 STRL (MISCELLANEOUS) ×1
STRIP CLOSURE SKIN 1/4X4 (GAUZE/BANDAGES/DRESSINGS) ×2 IMPLANT
SUT VIC AB 2-0 UR6 27 (SUTURE) ×8 IMPLANT
SUT VIC AB 4-0 SH 27 (SUTURE) ×4
SUT VIC AB 4-0 SH 27XANBCTRL (SUTURE) ×4 IMPLANT
SWABSTK COMLB BENZOIN TINCTURE (MISCELLANEOUS) ×2 IMPLANT
TROCAR ENDO BLADELESS 11MM (ENDOMECHANICALS) ×2 IMPLANT
TROCAR XCEL NON-BLD 5MMX100MML (ENDOMECHANICALS) ×2 IMPLANT
TROCAR XCEL UNIV SLVE 11M 100M (ENDOMECHANICALS) ×2 IMPLANT
TUBING INSUFFLATOR HI FLOW (MISCELLANEOUS) ×2 IMPLANT

## 2015-09-06 NOTE — Anesthesia Procedure Notes (Signed)
Performed by: Tawna Alwin       

## 2015-09-06 NOTE — Transfer of Care (Signed)
Immediate Anesthesia Transfer of Care Note  Patient: Jane Griffin  Procedure(s) Performed: Procedure(s): LAPAROSCOPIC BILATERAL SALPINGO OOPHORECTOMY (Bilateral)  Patient Location: PACU  Anesthesia Type:General  Level of Consciousness: sedated  Airway & Oxygen Therapy: Patient Spontanous Breathing and Patient connected to face mask oxygen  Post-op Assessment: Report given to RN and Post -op Vital signs reviewed and stable  Post vital signs: Reviewed and stable  Last Vitals:  Vitals:   09/06/15 1110  BP: (!) 199/83  Pulse: 69  Resp: 16  Temp: 36.8 C    Last Pain:  Vitals:   09/06/15 1110  TempSrc: Oral         Complications: No apparent anesthesia complications

## 2015-09-06 NOTE — Anesthesia Procedure Notes (Signed)
Procedure Name: Intubation Date/Time: 09/06/2015 1:45 PM Performed by: Allean Found Pre-anesthesia Checklist: Patient identified, Emergency Drugs available, Suction available and Timeout performed Patient Re-evaluated:Patient Re-evaluated prior to inductionOxygen Delivery Method: Circle system utilized Preoxygenation: Pre-oxygenation with 100% oxygen Intubation Type: IV induction Ventilation: Mask ventilation without difficulty Laryngoscope Size: Mac and 3 Grade View: Grade I Tube type: Oral Tube size: 7.0 mm Number of attempts: 1 Placement Confirmation: ETT inserted through vocal cords under direct vision,  positive ETCO2 and breath sounds checked- equal and bilateral Secured at: 21 cm Tube secured with: Tape Dental Injury: Teeth and Oropharynx as per pre-operative assessment

## 2015-09-06 NOTE — Progress Notes (Signed)
Pt ready for L/S BSO for a complex right ovarian cyst  . NPO . LAbs reviewed . All questions answered

## 2015-09-06 NOTE — Anesthesia Postprocedure Evaluation (Signed)
Anesthesia Post Note  Patient: [name]  Procedure(s) Performed: Procedure(s) (LRB): LAPAROSCOPIC BILATERAL SALPINGO OOPHORECTOMY (Bilateral)  Patient location during evaluation: PACU Anesthesia Type: General Level of consciousness: awake and alert Pain management: pain level controlled Vital Signs Assessment: post-procedure vital signs reviewed and stable Respiratory status: spontaneous breathing, nonlabored ventilation, respiratory function stable and patient connected to nasal cannula oxygen Cardiovascular status: blood pressure returned to baseline and stable Postop Assessment: no signs of nausea or vomiting Anesthetic complications: no    Last Vitals:  Vitals:   09/06/15 1644 09/06/15 1722  BP: (!) 158/60 (!) 157/61  Pulse: 93 95  Resp: 20 20  Temp: 37.4 C     Last Pain:  Vitals:   09/06/15 1722  TempSrc:   PainSc: 2                  [name]

## 2015-09-06 NOTE — Anesthesia Preprocedure Evaluation (Addendum)
Anesthesia Evaluation  Patient identified by MRN, date of birth, ID band Patient awake    Reviewed: Allergy & Precautions, NPO status , Patient's Chart, lab work & pertinent test results  History of Anesthesia Complications (+) PONV  Airway Mallampati: II  TM Distance: >3 FB Neck ROM: Full    Dental no notable dental hx.    Pulmonary neg COPD,    breath sounds clear to auscultation- rhonchi (-) wheezing      Cardiovascular Exercise Tolerance: Good hypertension, Pt. on medications (-) CAD and (-) Past MI  Rhythm:Regular Rate:Normal - Systolic murmurs and - Diastolic murmurs    Neuro/Psych negative neurological ROS  negative psych ROS   GI/Hepatic Neg liver ROS, GERD  Medicated,  Endo/Other  negative endocrine ROSneg diabetes  Renal/GU CRFRenal disease (stage 3 CKD, baseline Cr 1.3)     Musculoskeletal negative musculoskeletal ROS (+)   Abdominal (+) - obese,   Peds  Hematology negative hematology ROS (+)   Anesthesia Other Findings Past Medical History: No date: Allergic rhinitis No date: Cervical disc disease No date: Chronic kidney disease     Comment: STAGE 3 No date: GERD (gastroesophageal reflux disease) No date: H/O blood clots     Comment: around her 79's No date: Hyperlipidemia No date: Hypertension No date: PONV (postoperative nausea and vomiting)     Comment: nausea No date: Shingles   Reproductive/Obstetrics                            Anesthesia Physical Anesthesia Plan  ASA: II  Anesthesia Plan: General   Post-op Pain Management:    Induction: Intravenous  Airway Management Planned: Oral ETT  Additional Equipment:   Intra-op Plan:   Post-operative Plan: Extubation in OR  Informed Consent: I have reviewed the patients History and Physical, chart, labs and discussed the procedure including the risks, benefits and alternatives for the proposed anesthesia  with the patient or authorized representative who has indicated his/her understanding and acceptance.   Dental advisory given  Plan Discussed with: CRNA and Anesthesiologist  Anesthesia Plan Comments:         Anesthesia Quick Evaluation

## 2015-09-06 NOTE — Op Note (Signed)
NAMEJEMA, Jane Griffin NO.:  [id number]  MEDICAL RECORD NO.:  [id number]  LOCATION:  [facility]                         FACILITY:  [facility]  PHYSICIAN:  Laverta Baltimore, MDDATE OF BIRTH:  1940-02-09  DATE OF PROCEDURE:  09/06/2015 DATE OF DISCHARGE:                              OPERATIVE REPORT   PREOPERATIVE DIAGNOSIS:  Complex right ovarian cyst.  POSTOPERATIVE DIAGNOSIS: 1. Complex right ovarian cyst. 2. Pelvic adhesions.  PROCEDURE: 1. Laparoscopic bilateral salpingo-oophorectomy. 2. Lysis of adhesions.  ANESTHESIA:  General endotracheal anesthesia.  SURGEON:  Laverta Baltimore, MD.  INDICATIONS:  A 75 year old female, the patient noted to have a complex right ovarian cyst, which was followed for 4 months prior to final commitment to perform bilateral salpingo-oophorectomy.  The patient was having some pain on the right side as well.  DESCRIPTION OF PROCEDURE:  After adequate general endotracheal anesthesia, the patient was placed in dorsal supine position with the legs in the [place] stirrups.  Abdomen, perineum, and vagina were prepped and draped in normal sterile fashion.  A Foley catheter was placed in the bladder during the procedure.  Time-out was performed.  A 12 mm infraumbilical incision was made after injecting with 0.5% Marcaine. The laparoscope was advanced into the abdominal cavity under direct visualization with the Optiview cannula.  The patient's abdomen was insufflated with carbon dioxide.  Second port was placed in left lower quadrant, 3 cm medial, and 11 mm port was advanced into the abdominal cavity under direct visualization.  The patient was placed in the Trendelenburg, and there was a multilobed right ovarian cyst with some gelatinous excrescence on the ovarian capsule.  Several areas of adhesions were noted in the pelvic area.  Third port site was placed in the right lower quadrant, 3 cm medial to the right anterior iliac  spine. Under direct visualization, the trocar was advanced.  The Harmonic Scalpel was brought up, adhesions were taken down with Harmonic Scalpel. The right infundibulopelvic ligament was cauterized with the Kleppingers and the right tube and ovary were excised with Harmonic Scalpel. Similar procedure was repeated on the patient's left fallopian tube and ovary.  Again, the left infundibulopelvic ligament was cauterized and transected, and the left fallopian tube and ovary were removed without difficulty.  Normal peristaltic activity was visualized prior and post resection of each fallopian tube and ovary.  The endobag was placed in the patient's abdomen and the right and left tube were placed within. The both of these specimens were removed through the left lower port site.  Good hemostasis was noted.  Pressure was lowered to 7 mmHg and good hemostasis was visualized.  Carbon dioxide was then released from the patient's abdomen and the infraumbilical and the left lower port site.  The fascia was closed with 2-0 Vicryl suture.  The left lower port site required 3 separate sutures to close the defect.  Then, all skin incisions were closed with 4-0 interrupted Vicryl.  The Foley was removed as well as a sponge stick.  There were no complications.  INTRAOPERATIVE FLUIDS:  1100 mL.  ESTIMATED BLOOD LOSS:  Minimal.  URINE OUTPUT:  350 mL.  The patient tolerated the procedure was  taken to recovery room in good condition.          ______________________________ Laverta Baltimore, MD     TS/MEDQ  D:  09/06/2015  T:  09/06/2015  Job:  [id number]

## 2015-09-06 NOTE — Discharge Instructions (Signed)
AMBULATORY SURGERY  °DISCHARGE INSTRUCTIONS ° ° °1) The drugs that you were given will stay in your system until tomorrow so for the next 24 hours you should not: ° °A) Drive an automobile °B) Make any legal decisions °C) Drink any alcoholic beverage ° ° °2) You may resume regular meals tomorrow.  Today it is better to start with liquids and gradually work up to solid foods. ° °You may eat anything you prefer, but it is better to start with liquids, then soup and crackers, and gradually work up to solid foods. ° ° °3) Please notify your doctor immediately if you have any unusual bleeding, trouble breathing, redness and pain at the surgery site, drainage, fever, or pain not relieved by medication. ° ° ° °4) Additional Instructions: ° ° ° ° ° ° ° °Please contact your physician with any problems or Same Day Surgery at [phone], Monday through Friday 6 am to 4 pm, or [place] [facility] [place] Main number at [phone]. °

## 2015-09-06 NOTE — Brief Op Note (Signed)
09/06/2015  2:56 PM  PATIENT:  Jane Griffin  75 y.o. female  PRE-OPERATIVE DIAGNOSIS:  Complex right ovarian cyst  POST-OPERATIVE DIAGNOSIS:  Complex right ovarian cyst, Pelvic adhesions   PROCEDURE:  Procedure(s): LAPAROSCOPIC BILATERAL SALPINGO OOPHORECTOMY (Bilateral)   Lysis of adhesions   SURGEON:  Surgeon(s) and Role:    Boykin Nearing, MD - Primary     PHYSICIAN ASSISTANT: scrub tech   ASSISTANTS: none   ANESTHESIA:   general  EBL:  Total I/O In: 1100 [I.V.:1100] Out: 370 [Urine:350; Blood:20]  BLOOD ADMINISTERED:none  DRAINS: none   LOCAL MEDICATIONS USED:  MARCAINE     SPECIMEN:  Source of Specimen:  bilateral tubes and ovaries  DISPOSITION OF SPECIMEN:  PATHOLOGY  COUNTS:  YES  TOURNIQUET:  * No tourniquets in log *  DICTATION: .Other Dictation: Dictation Number verbal  PLAN OF CARE: Discharge to home after PACU  PATIENT DISPOSITION:  PACU - hemodynamically stable.   Delay start of Pharmacological VTE agent (>24hrs) due to surgical blood loss or risk of bleeding: not applicable

## 2015-09-08 LAB — SURGICAL PATHOLOGY

## 2015-09-13 ENCOUNTER — Encounter: Payer: Self-pay | Admitting: Obstetrics and Gynecology

## 2015-09-16 ENCOUNTER — Other Ambulatory Visit: Payer: Self-pay | Admitting: Obstetrics and Gynecology

## 2015-09-16 ENCOUNTER — Encounter: Payer: Self-pay | Admitting: *Deleted

## 2015-09-16 ENCOUNTER — Ambulatory Visit
Admission: RE | Admit: 2015-09-16 | Discharge: 2015-09-16 | Disposition: A | Discharge status: Home / Self Care | Payer: Medicare Other | Source: Ambulatory Visit | Attending: Obstetrics and Gynecology | Admitting: Obstetrics and Gynecology

## 2015-09-16 DIAGNOSIS — G8918 Other acute postprocedural pain: Secondary | ICD-10-CM

## 2015-09-16 DIAGNOSIS — I700 Atherosclerosis of aorta: Secondary | ICD-10-CM | POA: Insufficient documentation

## 2015-09-16 DIAGNOSIS — K4090 Unilateral inguinal hernia, without obstruction or gangrene, not specified as recurrent: Secondary | ICD-10-CM | POA: Insufficient documentation

## 2015-09-16 MED ORDER — IOPAMIDOL (ISOVUE-300) INJECTION 61%
80.0000 mL | Freq: Once | INTRAVENOUS | Status: AC | PRN
  Administered 2015-09-16: 80 mL via INTRAVENOUS

## 2015-09-16 NOTE — Progress Notes (Signed)
Patient came by to let Dr Bary Castilla know she was having right groin pain post BSO and wanted him to review her CT scan and see about an appointment.

## 2015-09-16 NOTE — Telephone Encounter (Signed)
error    This encounter was created in error - please disregard.

## 2015-09-18 ENCOUNTER — Encounter
Admission: EM | Disposition: A | Discharge status: Home / Self Care | Payer: Self-pay | Source: Home / Self Care | Attending: Emergency Medicine

## 2015-09-18 ENCOUNTER — Encounter: Payer: Self-pay | Admitting: Emergency Medicine

## 2015-09-18 ENCOUNTER — Emergency Department: Payer: Medicare Other | Admitting: Certified Registered"

## 2015-09-18 ENCOUNTER — Emergency Department
Admission: EM | Admit: 2015-09-18 | Discharge: 2015-09-18 | Disposition: A | Discharge status: Home / Self Care | Payer: Medicare Other | Attending: Emergency Medicine | Admitting: Emergency Medicine

## 2015-09-18 DIAGNOSIS — E785 Hyperlipidemia, unspecified: Secondary | ICD-10-CM | POA: Diagnosis not present

## 2015-09-18 DIAGNOSIS — Z803 Family history of malignant neoplasm of breast: Secondary | ICD-10-CM | POA: Diagnosis not present

## 2015-09-18 DIAGNOSIS — N839 Noninflammatory disorder of ovary, fallopian tube and broad ligament, unspecified: Secondary | ICD-10-CM | POA: Insufficient documentation

## 2015-09-18 DIAGNOSIS — Z825 Family history of asthma and other chronic lower respiratory diseases: Secondary | ICD-10-CM | POA: Diagnosis not present

## 2015-09-18 DIAGNOSIS — R1031 Right lower quadrant pain: Secondary | ICD-10-CM

## 2015-09-18 DIAGNOSIS — K219 Gastro-esophageal reflux disease without esophagitis: Secondary | ICD-10-CM | POA: Insufficient documentation

## 2015-09-18 DIAGNOSIS — Z885 Allergy status to narcotic agent status: Secondary | ICD-10-CM | POA: Insufficient documentation

## 2015-09-18 DIAGNOSIS — Z8249 Family history of ischemic heart disease and other diseases of the circulatory system: Secondary | ICD-10-CM | POA: Diagnosis not present

## 2015-09-18 DIAGNOSIS — Z832 Family history of diseases of the blood and blood-forming organs and certain disorders involving the immune mechanism: Secondary | ICD-10-CM | POA: Insufficient documentation

## 2015-09-18 DIAGNOSIS — K4130 Unilateral femoral hernia, with obstruction, without gangrene, not specified as recurrent: Secondary | ICD-10-CM | POA: Insufficient documentation

## 2015-09-18 DIAGNOSIS — J309 Allergic rhinitis, unspecified: Secondary | ICD-10-CM | POA: Insufficient documentation

## 2015-09-18 DIAGNOSIS — Z90710 Acquired absence of both cervix and uterus: Secondary | ICD-10-CM | POA: Insufficient documentation

## 2015-09-18 DIAGNOSIS — N183 Chronic kidney disease, stage 3 (moderate): Secondary | ICD-10-CM | POA: Insufficient documentation

## 2015-09-18 DIAGNOSIS — K4090 Unilateral inguinal hernia, without obstruction or gangrene, not specified as recurrent: Secondary | ICD-10-CM

## 2015-09-18 DIAGNOSIS — Z8661 Personal history of infections of the central nervous system: Secondary | ICD-10-CM | POA: Insufficient documentation

## 2015-09-18 DIAGNOSIS — Z888 Allergy status to other drugs, medicaments and biological substances status: Secondary | ICD-10-CM | POA: Diagnosis not present

## 2015-09-18 DIAGNOSIS — I129 Hypertensive chronic kidney disease with stage 1 through stage 4 chronic kidney disease, or unspecified chronic kidney disease: Secondary | ICD-10-CM | POA: Diagnosis not present

## 2015-09-18 DIAGNOSIS — Z882 Allergy status to sulfonamides status: Secondary | ICD-10-CM | POA: Diagnosis not present

## 2015-09-18 HISTORY — PX: FEMORAL HERNIA REPAIR: SHX632

## 2015-09-18 LAB — COMPREHENSIVE METABOLIC PANEL
ALBUMIN: 4 g/dL (ref 3.5–5.0)
ALK PHOS: 80 U/L (ref 38–126)
ALT: 12 U/L — ABNORMAL LOW (ref 14–54)
ANION GAP: 10 (ref 5–15)
AST: 19 U/L (ref 15–41)
BILIRUBIN TOTAL: 1.1 mg/dL (ref 0.3–1.2)
BUN: 19 mg/dL (ref 6–20)
CALCIUM: 9.3 mg/dL (ref 8.9–10.3)
CO2: 25 mmol/L (ref 22–32)
Chloride: 102 mmol/L (ref 101–111)
Creatinine, Ser: 1.26 mg/dL — ABNORMAL HIGH (ref 0.44–1.00)
GFR calc non Af Amer: 41 mL/min — ABNORMAL LOW (ref >60)
GFR, EST AFRICAN AMERICAN: 48 mL/min — AB (ref >60)
GLUCOSE: 94 mg/dL (ref 65–99)
Potassium: 3.9 mmol/L (ref 3.5–5.1)
Sodium: 137 mmol/L (ref 135–145)
TOTAL PROTEIN: 7.9 g/dL (ref 6.5–8.1)

## 2015-09-18 LAB — CBC
HEMATOCRIT: 36.5 % (ref 35.0–47.0)
HEMOGLOBIN: 12.5 g/dL (ref 12.0–16.0)
MCH: 29.7 pg (ref 26.0–34.0)
MCHC: 34.3 g/dL (ref 32.0–36.0)
MCV: 86.5 fL (ref 80.0–100.0)
Platelets: 255 10*3/uL (ref 150–440)
RBC: 4.22 MIL/uL (ref 3.80–5.20)
RDW: 13.2 % (ref 11.5–14.5)
WBC: 11.7 10*3/uL — ABNORMAL HIGH (ref 3.6–11.0)

## 2015-09-18 SURGERY — HERNIA REPAIR FEMORAL
Anesthesia: General | Laterality: Right | Wound class: Contaminated

## 2015-09-18 MED ORDER — FENTANYL CITRATE (PF) 100 MCG/2ML IJ SOLN
INTRAMUSCULAR | Status: DC | PRN
  Administered 2015-09-18 (×4): 50 ug via INTRAVENOUS

## 2015-09-18 MED ORDER — DEXTROSE-NACL 5-0.45 % IV SOLN
INTRAVENOUS | Status: DC
  Administered 2015-09-18: 1000 mL via INTRAVENOUS

## 2015-09-18 MED ORDER — BUPIVACAINE HCL (PF) 0.5 % IJ SOLN
INTRAMUSCULAR | Status: AC
  Filled 2015-09-18: qty 30

## 2015-09-18 MED ORDER — FENTANYL CITRATE (PF) 100 MCG/2ML IJ SOLN
INTRAMUSCULAR | Status: AC
  Administered 2015-09-18: 25 ug via INTRAVENOUS
  Filled 2015-09-18: qty 2

## 2015-09-18 MED ORDER — LACTATED RINGERS IV SOLN
INTRAVENOUS | Status: DC | PRN
  Administered 2015-09-18: 16:00:00 via INTRAVENOUS

## 2015-09-18 MED ORDER — PROPOFOL 500 MG/50ML IV EMUL
INTRAVENOUS | Status: DC | PRN
  Administered 2015-09-18: 100 ug/kg/min via INTRAVENOUS

## 2015-09-18 MED ORDER — ACETAMINOPHEN 10 MG/ML IV SOLN
INTRAVENOUS | Status: AC
  Filled 2015-09-18: qty 100

## 2015-09-18 MED ORDER — FENTANYL CITRATE (PF) 100 MCG/2ML IJ SOLN
25.0000 ug | INTRAMUSCULAR | Status: DC | PRN
  Administered 2015-09-18 (×3): 25 ug via INTRAVENOUS

## 2015-09-18 MED ORDER — BUPIVACAINE HCL (PF) 0.5 % IJ SOLN
INTRAMUSCULAR | Status: DC | PRN
  Administered 2015-09-18: 20 mL

## 2015-09-18 MED ORDER — CEFAZOLIN SODIUM-DEXTROSE 2-4 GM/100ML-% IV SOLN
2.0000 g | INTRAVENOUS | Status: AC
  Administered 2015-09-18: 2 g via INTRAVENOUS
  Filled 2015-09-18: qty 100

## 2015-09-18 MED ORDER — OXYCODONE HCL 5 MG PO TABS: 5.0000 mg | ORAL_TABLET | Freq: Once | ORAL | Status: DC | PRN

## 2015-09-18 MED ORDER — ACETAMINOPHEN 10 MG/ML IV SOLN
INTRAVENOUS | Status: DC | PRN
  Administered 2015-09-18: 1000 mg via INTRAVENOUS

## 2015-09-18 MED ORDER — MEPERIDINE HCL 25 MG/ML IJ SOLN: 6.2500 mg | INTRAMUSCULAR | Status: DC | PRN

## 2015-09-18 MED ORDER — DEXAMETHASONE SODIUM PHOSPHATE 10 MG/ML IJ SOLN
INTRAMUSCULAR | Status: DC | PRN
  Administered 2015-09-18: 5 mg via INTRAVENOUS

## 2015-09-18 MED ORDER — OXYCODONE HCL 5 MG/5ML PO SOLN: 5.0000 mg | Freq: Once | ORAL | Status: DC | PRN

## 2015-09-18 MED ORDER — FENTANYL CITRATE (PF) 100 MCG/2ML IJ SOLN
50.0000 ug | Freq: Once | INTRAMUSCULAR | Status: AC
  Administered 2015-09-18: 50 ug via INTRAVENOUS
  Filled 2015-09-18: qty 2

## 2015-09-18 MED ORDER — LIDOCAINE HCL (PF) 2 % IJ SOLN
INTRAMUSCULAR | Status: DC | PRN
  Administered 2015-09-18: 50 mg

## 2015-09-18 MED ORDER — ONDANSETRON HCL 4 MG/2ML IJ SOLN
INTRAMUSCULAR | Status: DC | PRN
  Administered 2015-09-18: 4 mg via INTRAVENOUS

## 2015-09-18 MED ORDER — PROMETHAZINE HCL 25 MG/ML IJ SOLN: 6.2500 mg | INTRAMUSCULAR | Status: DC | PRN

## 2015-09-18 MED ORDER — PROPOFOL 10 MG/ML IV BOLUS
INTRAVENOUS | Status: DC | PRN
  Administered 2015-09-18: 120 mg via INTRAVENOUS

## 2015-09-18 MED ORDER — LIDOCAINE HCL (PF) 1 % IJ SOLN
INTRAMUSCULAR | Status: AC
  Filled 2015-09-18: qty 30

## 2015-09-18 SURGICAL SUPPLY — 28 items
BLADE SURG 15 STRL SS SAFETY (BLADE) ×2 IMPLANT
CANISTER SUCT 1200ML W/VALVE (MISCELLANEOUS) ×2 IMPLANT
CHLORAPREP W/TINT 26ML (MISCELLANEOUS) ×2 IMPLANT
DECANTER SPIKE VIAL GLASS SM (MISCELLANEOUS) IMPLANT
DRAIN PENROSE 1/4X12 LTX (DRAIN) ×2 IMPLANT
DRAPE LAPAROTOMY 100X77 ABD (DRAPES) ×2 IMPLANT
ELECT REM PT RETURN 9FT ADLT (ELECTROSURGICAL) ×2
ELECTRODE REM PT RTRN 9FT ADLT (ELECTROSURGICAL) ×1 IMPLANT
GLOVE BIO SURGEON STRL SZ7 (GLOVE) ×6 IMPLANT
GOWN STRL REUS W/ TWL LRG LVL3 (GOWN DISPOSABLE) ×2 IMPLANT
GOWN STRL REUS W/TWL LRG LVL3 (GOWN DISPOSABLE) ×2
KIT RM TURNOVER STRD PROC AR (KITS) ×2 IMPLANT
LABEL OR SOLS (LABEL) ×2 IMPLANT
LIQUID BAND (GAUZE/BANDAGES/DRESSINGS) ×2 IMPLANT
NDL HPO THNWL 1X22GA REG BVL (NEEDLE) IMPLANT
NEEDLE HYPO 25X1 1.5 SAFETY (NEEDLE) ×2 IMPLANT
NEEDLE SAFETY 22GX1 (NEEDLE)
NS IRRIG 500ML POUR BTL (IV SOLUTION) ×2 IMPLANT
PACK BASIN MINOR ARMC (MISCELLANEOUS) ×2 IMPLANT
SUT PDS 2-0 27IN (SUTURE) ×4 IMPLANT
SUT VIC AB 2-0 SH 27 (SUTURE) ×1
SUT VIC AB 2-0 SH 27XBRD (SUTURE) ×1 IMPLANT
SUT VIC AB 3-0 54X BRD REEL (SUTURE) ×1 IMPLANT
SUT VIC AB 3-0 BRD 54 (SUTURE) ×1
SUT VIC AB 3-0 SH 27 (SUTURE) ×1
SUT VIC AB 3-0 SH 27X BRD (SUTURE) ×1 IMPLANT
SUT VIC AB 4-0 FS2 27 (SUTURE) ×2 IMPLANT
SYR CONTROL 10ML (SYRINGE) ×2 IMPLANT

## 2015-09-18 NOTE — OR Nursing (Signed)
Ice pack in place patient resting well

## 2015-09-18 NOTE — ED Notes (Addendum)
Dr. Jamal Collin called and stated that he will come by to see patient, he would like patient to have CBC and Met C only on patient and that ED doctor does not need to see patient. I told him I would inform Dr. Kerman Passey of what he said.  Per Dr. Kerman Passey OK to just insert IV and draw CBC and Met C.

## 2015-09-18 NOTE — ED Notes (Signed)
Patient had both ovaries and tube removed on July 31st. By Dr. Angelina Sheriff. Patient states that on Monday she started having increased pain in the right lower abdomen and noticed that there was some swelling. Patient was been by OBGYN who ordered a CT and told her that she had a hernia. Patient states that today swelling seems to be worse, patient states that she has a constant burning and stinging in the area.  Patient denies any lifting, fevers, chills, N/V. Patient states that she has had diarrhea since Thursday after drinking contrast for the CT scan.

## 2015-09-18 NOTE — Anesthesia Preprocedure Evaluation (Signed)
Anesthesia Evaluation  Patient identified by MRN, date of birth, ID band Patient awake    Reviewed: Allergy & Precautions, NPO status , Patient's Chart, lab work & pertinent test results  History of Anesthesia Complications (+) PONV  Airway Mallampati: II  TM Distance: >3 FB Neck ROM: Full    Dental no notable dental hx.    Pulmonary neg COPD,    breath sounds clear to auscultation- rhonchi (-) wheezing      Cardiovascular Exercise Tolerance: Good hypertension, Pt. on medications (-) CAD and (-) Past MI  Rhythm:Regular Rate:Normal - Systolic murmurs and - Diastolic murmurs    Neuro/Psych negative neurological ROS  negative psych ROS   GI/Hepatic Neg liver ROS, GERD  Medicated,  Endo/Other  negative endocrine ROSneg diabetes  Renal/GU CRFRenal disease (stage 3 CKD, baseline Cr 1.3)     Musculoskeletal negative musculoskeletal ROS (+)   Abdominal (+) - obese,   Peds  Hematology negative hematology ROS (+)   Anesthesia Other Findings Past Medical History: No date: Allergic rhinitis No date: Cervical disc disease No date: Chronic kidney disease     Comment: STAGE 3 No date: GERD (gastroesophageal reflux disease) No date: H/O blood clots     Comment: around her 64's No date: Hyperlipidemia No date: Hypertension No date: PONV (postoperative nausea and vomiting)     Comment: nausea No date: Shingles   Reproductive/Obstetrics                             Anesthesia Physical  Anesthesia Plan  ASA: II  Anesthesia Plan: General   Post-op Pain Management:    Induction: Intravenous  Airway Management Planned: Oral ETT and LMA  Additional Equipment:   Intra-op Plan:   Post-operative Plan: Extubation in OR  Informed Consent: I have reviewed the patients History and Physical, chart, labs and discussed the procedure including the risks, benefits and alternatives for the proposed  anesthesia with the patient or authorized representative who has indicated his/her understanding and acceptance.   Dental advisory given  Plan Discussed with: CRNA and Anesthesiologist  Anesthesia Plan Comments:         Anesthesia Quick Evaluation

## 2015-09-18 NOTE — Anesthesia Postprocedure Evaluation (Signed)
Anesthesia Post Note  Patient: Jane Griffin  Procedure(s) Performed: Procedure(s) (LRB): HERNIA REPAIR femoral  ADULT (Right)  Patient location during evaluation: PACU Anesthesia Type: General Level of consciousness: awake and alert and oriented Pain management: pain level controlled Vital Signs Assessment: post-procedure vital signs reviewed and stable Respiratory status: spontaneous breathing, nonlabored ventilation and respiratory function stable Cardiovascular status: blood pressure returned to baseline and stable Postop Assessment: no signs of nausea or vomiting Anesthetic complications: no    Last Vitals:  Vitals:   09/18/15 1728 09/18/15 1750  BP: (!) 175/90   Pulse: (!) 110 100  Resp: 14 19  Temp: 37.2 C     Last Pain:  Vitals:   09/18/15 1758  TempSrc:   PainSc: 5                  [name]

## 2015-09-18 NOTE — Transfer of Care (Signed)
Immediate Anesthesia Transfer of Care Note  Patient: Jane Griffin  Procedure(s) Performed: Procedure(s): HERNIA REPAIR femoral  ADULT (Right)  Patient Location: PACU  Anesthesia Type:General  Level of Consciousness: awake and alert   Airway & Oxygen Therapy: Patient Spontanous Breathing and Patient connected to face mask oxygen  Post-op Assessment: Report given to RN and Post -op Vital signs reviewed and stable  Post vital signs: Reviewed  Last Vitals:  Vitals:   09/18/15 1600 09/18/15 1728  BP: (!) 178/87 (!) 175/90  Pulse: (!) 102 (!) 110  Resp:  14  Temp:  37.2 C    Last Pain:  Vitals:   09/18/15 1500  TempSrc:   PainSc: 4          Complications: No apparent anesthesia complications

## 2015-09-18 NOTE — ED Triage Notes (Signed)
States had surgery for ovary aug 31st. States 6 days ago noted swelling incision line. Has seen MD 2 days ago. Has increased since. Noted firm swelling at incicional line. Dr. Vernona Rieger told her to meet her her.

## 2015-09-18 NOTE — ED Provider Notes (Signed)
[facility] Emergency Department Provider Note  Time seen: 2:05 PM  I have reviewed the triage vital signs and the nursing notes.   HISTORY  Chief Complaint Abdominal Pain    HPI Jane Griffin is a 75 y.o. female with a past medical history of CK D, hyperlipidemia, hypertension, recent hysterectomy 2 weeks ago by Dr. Ouida Sills, presents to the emergency department for increased right lower quadrant abdominal pain. According to the patient she has noticed right lower quadrant abdominal pain since her surgery. She had a CT scan performed 09/16/15 showing a fat-containing inguinal hernia with inflammation and surrounding stranding. Patient states she has had loose bowel movements ever since her CT scan, states today the pain has worsened so she came to the emergency department for evaluation. Currently states the pain is a 6/10 sharp stabbing type pain in her right lower quadrant. Denies vomiting, black or bloody stool, constipation or fever.  Past Medical History:  Diagnosis Date  . Allergic rhinitis   . Cervical disc disease   . Chronic kidney disease    STAGE 3  . GERD (gastroesophageal reflux disease)   . H/O blood clots    around her 35's  . Hyperlipidemia   . Hypertension   . PONV (postoperative nausea and vomiting)    nausea  . Shingles     Patient Active Problem List   Diagnosis Date Noted  . Ventral hernia without obstruction or gangrene 06/16/2015  . Elevated serum creatinine 06/16/2015  . Ovarian mass, right 06/16/2015  . Upper airway cough syndrome 03/14/2012    Past Surgical History:  Procedure Laterality Date  . ABDOMINAL HYSTERECTOMY    . COLONOSCOPY  2013  . ELBOW SURGERY Left   . HERNIA REPAIR    . LAPAROSCOPIC BILATERAL SALPINGO OOPHERECTOMY Bilateral 09/06/2015   Procedure: LAPAROSCOPIC BILATERAL SALPINGO OOPHORECTOMY;  Surgeon: Boykin Nearing, MD;  Location: [facility] ORS;  Service: Gynecology;  Laterality: Bilateral;  .  PARTIAL HYSTERECTOMY    . TUBAL LIGATION    . UPPER GI ENDOSCOPY    . WRIST SURGERY Right     Prior to Admission medications   Medication Sig Start Date End Date Taking? Authorizing Provider  albuterol (PROVENTIL HFA;VENTOLIN HFA) 108 (90 Base) MCG/ACT inhaler Inhale 2 puffs into the lungs every 6 (six) hours as needed for wheezing or shortness of breath.    Historical Provider, MD  aspirin EC 81 MG tablet Take 81 mg by mouth every morning.     Historical Provider, MD  Azelastine-Fluticasone (DYMISTA) 137-50 MCG/ACT SUSP Place into the nose 2 (two) times daily as needed.    Historical Provider, MD  fluvastatin XL (LESCOL XL) 80 MG 24 hr tablet Take 80 mg by mouth daily. 06/10/15   Historical Provider, MD  losartan (COZAAR) 100 MG tablet TAKE 1 TABLET (100 MG TOTAL) BY MOUTH ONCE DAILY. 03/30/15   Historical Provider, MD  pantoprazole (PROTONIX) 40 MG tablet TAKE 1 TABLET BY MOUTH ONCE A DAY 30-60 MINUTES BEFORE FIRST MEAL OF THE DAY 02/03/13   Tanda Rockers, MD    Allergies  Allergen Reactions  . Codeine Nausea Only  . Gabapentin     nightmares  . Lipitor [Atorvastatin]     Aches   . Pravachol [Pravastatin Sodium]     aches  . Tramadol     nausea  . Aleve [Naproxen Sodium] Rash  . Sulfa Antibiotics Itching and Rash    Family History  Problem Relation Age of Onset  .  Clotting disorder Mother   . Heart failure Mother   . Emphysema Father   . Heart failure Father   . Breast cancer Daughter 24    Social History Social History  Substance Use Topics  . Smoking status: Never Smoker  . Smokeless tobacco: Never Used  . Alcohol use No    Review of Systems Constitutional: Negative for fever. Cardiovascular: Negative for chest pain. Respiratory: Negative for shortness of breath. Gastrointestinal: Right lower quadrant abdominal pain. Positive for nausea. Negative for vomiting or diarrhea. Negative for black or bloody stool. Negative for constipation. Genitourinary: Negative for  dysuria. Neurological: Negative for headache 10-point ROS otherwise negative.  ____________________________________________   PHYSICAL EXAM:  VITAL SIGNS: ED Triage Vitals  Enc Vitals Group     BP 09/18/15 1329 (!) 154/105     Pulse Rate 09/18/15 1329 (!) 103     Resp 09/18/15 1329 20     Temp 09/18/15 1329 98.3 F (36.8 C)     Temp Source 09/18/15 1329 Oral     SpO2 09/18/15 1329 94 %     Weight 09/18/15 1329 119 lb (54 kg)     Height 09/18/15 1329 5\' 2"  (1.575 m)     Head Circumference --      Peak Flow --      Pain Score 09/18/15 1330 5     Pain Loc --      Pain Edu? --      Excl. in [place]? --     Constitutional: Alert and oriented. Well appearing and in no distress. Eyes: Normal exam ENT   Head: Normocephalic and atraumatic   Mouth/Throat: Mucous membranes are moist. Cardiovascular: Normal rate, regular rhythm. No murmur Respiratory: Normal respiratory effort without tachypnea nor retractions. Breath sounds are clear  Gastrointestinal: Soft and nontender to size a moderate right inguinal hernia mass which is moderately tender to palpation without rebound or guarding. No tenderness within the abdomen. Musculoskeletal: Nontender with normal range of motion in all extremities.  Neurologic:  Normal speech and language. No gross focal neurologic deficits are appreciated. Skin:  Skin is warm, dry and intact.  Psychiatric: Mood and affect are normal. Speech and behavior are normal.   ____________________________________________   INITIAL IMPRESSION / ASSESSMENT AND PLAN / ED COURSE  Pertinent labs & imaging results that were available during my care of the patient were reviewed by me and considered in my medical decision making (see chart for details).  The patient presents for increased right lower quadrant abdominal pain. On exam the patient has a palpable right inguinal hernia with moderate tenderness to palpation. No skin color changes. Patient has been followed  by Dr. Leafy Ro, we'll check labs in the emergency department and discussed with Dr. Leafy Ro for further recommendations.  I discussed the patient with Dr. Leafy Ro who recommends consultation with general surgery. I discussed the patient with Dr. Burt Knack who is currently reviewing the CT images.  Dr. Jamal Collin is aware of the patient prior to her arrival in the ER.  He is on his way to the ER to see the patient.  ____________________________________________   FINAL CLINICAL IMPRESSION(S) / ED DIAGNOSES  Right inguinal hernia    Harvest Dark, MD 09/18/15 1416

## 2015-09-18 NOTE — Discharge Instructions (Signed)

## 2015-09-18 NOTE — H&P (Signed)
Jane Griffin is an 75 y.o. female.   Chief Complaint: swelling and pain in right groin HPI: This is a 75 year old white female was initially seen by my associated Dr. Lacie Scotts in May for small umbilical hernia. However the patient had the need for bilateral salpingo-oophorectomy and given that the hernia was relatively small and was recommended that she undergo the pelvic procedure first and reassess after. At that time she had had a CT scan of the abdomen which had revealed a very small umbilical hernia also a small fat-containing right inguinal hernia. This patient about the 12 days ago underwent laparoscopy and bilateral salpingo-oophorectomy. About 2-3 days later she noticed some pain in the right drawn and a small swelling. He continued to bother her and on Thursday she had a repeat CT scan after evaluation at her [place] office. CT now revealed that there was a slightly larger fat-containing right inguinal hernia with also a little fluid. No other significant findings are noted patient was supposed to follow up with Dr. Tollie Pizza the middle next week however she called today stating that the pain got noticeably worse in the swelling is gotten bigger and more uncomfortable. Denies any nausea vomiting or bowel changes and no fever or chills  Past Medical History:  Diagnosis Date  . Allergic rhinitis   . Cervical disc disease   . Chronic kidney disease    STAGE 3  . GERD (gastroesophageal reflux disease)   . H/O blood clots    around her 69's  . Hyperlipidemia   . Hypertension   . PONV (postoperative nausea and vomiting)    nausea  . Shingles     Past Surgical History:  Procedure Laterality Date  . ABDOMINAL HYSTERECTOMY    . COLONOSCOPY  2013  . ELBOW SURGERY Left   . HERNIA REPAIR    . LAPAROSCOPIC BILATERAL SALPINGO OOPHERECTOMY Bilateral 09/06/2015   Procedure: LAPAROSCOPIC BILATERAL SALPINGO OOPHORECTOMY;  Surgeon: Boykin Nearing, MD;  Location: [facility] ORS;  Service:  Gynecology;  Laterality: Bilateral;  . PARTIAL HYSTERECTOMY    . TUBAL LIGATION    . UPPER GI ENDOSCOPY    . WRIST SURGERY Right     Family History  Problem Relation Age of Onset  . Clotting disorder Mother   . Heart failure Mother   . Emphysema Father   . Heart failure Father   . Breast cancer Daughter 69   Social History:  reports that she has never smoked. She has never used smokeless tobacco. She reports that she does not drink alcohol or use drugs.  Allergies:  Allergies  Allergen Reactions  . Codeine Nausea Only  . Gabapentin     nightmares  . Lipitor [Atorvastatin]     Aches   . Pravachol [Pravastatin Sodium]     aches  . Tramadol     nausea  . Aleve [Naproxen Sodium] Rash  . Sulfa Antibiotics Itching and Rash     (Not in a hospital admission)  Results for orders placed or performed during the hospital encounter of 09/18/15 (from the past 48 hour(s))  CBC     Status: Abnormal   Collection Time: 09/18/15  2:26 PM  Result Value Ref Range   WBC 11.7 (H) 3.6 - 11.0 K/uL   RBC 4.22 3.80 - 5.20 MIL/uL   Hemoglobin 12.5 12.0 - 16.0 g/dL   HCT 36.5 35.0 - 47.0 %   MCV 86.5 80.0 - 100.0 fL   MCH 29.7 26.0 - 34.0 pg  MCHC 34.3 32.0 - 36.0 g/dL   RDW 13.2 11.5 - 14.5 %   Platelets 255 150 - 440 K/uL  Comprehensive metabolic panel     Status: Abnormal   Collection Time: 09/18/15  2:26 PM  Result Value Ref Range   Sodium 137 135 - 145 mmol/L   Potassium 3.9 3.5 - 5.1 mmol/L   Chloride 102 101 - 111 mmol/L   CO2 25 22 - 32 mmol/L   Glucose, Bld 94 65 - 99 mg/dL   BUN 19 6 - 20 mg/dL   Creatinine, Ser 1.26 (H) 0.44 - 1.00 mg/dL   Calcium 9.3 8.9 - 10.3 mg/dL   Total Protein 7.9 6.5 - 8.1 g/dL   Albumin 4.0 3.5 - 5.0 g/dL   AST 19 15 - 41 U/L   ALT 12 (L) 14 - 54 U/L   Alkaline Phosphatase 80 38 - 126 U/L   Total Bilirubin 1.1 0.3 - 1.2 mg/dL   GFR calc non Af Amer 41 (L) >60 mL/min   GFR calc Af Amer 48 (L) >60 mL/min    Comment: (NOTE) The eGFR has been  calculated using the CKD EPI equation. This calculation has not been validated in all clinical situations. eGFR's persistently <60 mL/min signify possible Chronic Kidney Disease.    Anion gap 10 5 - 15   No results found.  Review of Systems  Constitutional: Negative.   Respiratory: Negative.   Cardiovascular: Negative.   Gastrointestinal: Positive for abdominal pain (Right inguinal region). Negative for constipation, diarrhea, heartburn, nausea and vomiting.  Genitourinary: Negative.     Blood pressure (!) 154/105, pulse (!) 103, temperature 98.3 F (36.8 C), temperature source Oral, resp. rate 20, height 5' 2"  (1.575 m), weight 119 lb (54 kg), SpO2 94 %. Physical Exam  Constitutional: She is oriented to person, place, and time. She appears well-developed and well-nourished.  Eyes: Conjunctivae are normal. No scleral icterus.  Neck: Neck supple.  Cardiovascular: Normal rate, regular rhythm and normal heart sounds.   Respiratory: Effort normal and breath sounds normal.  GI: Soft. Bowel sounds are normal. She exhibits mass (right inguinal mass, irreducible, tender, mild erythema).  Lymphadenopathy:    She has cervical adenopathy.  Neurological: She is alert and oriented to person, place, and time.  Skin: Skin is warm and dry.     Assessment/Plan The CT scan was actually reviewed by me yesterday of both the other one from 2 days ago and underwent from May 2017. Physical exam is consistent with an incarcerated right inguinal hernia containing primarily fat with possibility of strangulation of the fatty contents. The findings were discussed in full with the patient. Recommended repair of this hernia. Procedure including the possible use of mesh and risks benefits were  explained to her. Patient is agreeable and we'll proceed with at this afternoon    Christene Lye, MD 09/18/2015, 3:00 PM

## 2015-09-18 NOTE — Anesthesia Procedure Notes (Signed)
Procedure Name: LMA Insertion Performed by: Rolla Plate Pre-anesthesia Checklist: Patient identified, Patient being monitored, Timeout performed, Emergency Drugs available and Suction available Patient Re-evaluated:Patient Re-evaluated prior to inductionOxygen Delivery Method: Circle system utilized Preoxygenation: Pre-oxygenation with 100% oxygen Intubation Type: IV induction LMA: LMA inserted LMA Size: 3.0 Tube type: Oral Number of attempts: 1 Placement Confirmation: positive ETCO2 and breath sounds checked- equal and bilateral Tube secured with: Tape Dental Injury: Teeth and Oropharynx as per pre-operative assessment

## 2015-09-18 NOTE — Op Note (Signed)
Preop diagnosis: Incarcerated right inguinal hernia.  Post op diagnosis: Incarcerated right femoral hernia  Operation: Repair of incarcerated right femoral hernia  Surgeon: Mckinley Jewel  Assistant:     Anesthesia: Gen.  Complications: None  EBL: Less than 20  Drains: None  Description: Patient was put to sleep with an LMA and the right inguinal region was prepped and draped out as sterile field. Timeout was performed. The patient had an irreducible swelling and noted that this was lying somewhat inferior to the inguinal ligament area. 20 mL of 0.5% Marcaine was instilled in either side of the planned incision for postop analgesia. Incision was made overlying this area just below the level the line between the anterior superior iliac spine and the pubic tubercle. This was carried down through the subcutaneous tissue and scar Scarpa's fascia to reveal a an inflamed and partially strangulated fatty protrusion of about 4-5 cm size in maximal length. With careful exposure this was freed. Bleeding was controlled with the cautery and ligatures of 3-0 Vicryl. This was followed down to and neck which in fact turned out to be the femoral canal a small sac was identified the most of the hernia being preperitoneal fat the sac and the preperitoneal fat within adequately exposed sac was opened and some peritoneal fluid drained out. One contents within the sac was noted and this was a what appeared to be the the fimbrial end of the tube. This was ligated at the base and removed. Excess of the sac was excised out in the sac was closed with a 2-0 Vicryl stitch. The femoral canal was then closed by approximating the ligament below and to the inguinal ligament the bowel with 2 interrupted stitches of 2-0 PDS. The wound was irrigated and after ensuring hemostasis the subcutaneous change tissue was closed with a running 3-0 Vicryl. Skin closed with subcuticular 4-0 Vicryl and covered with liqui  Ban. Patient  subsequently was extubated and returned recovery room stable condition.

## 2015-09-20 ENCOUNTER — Encounter: Payer: Self-pay | Admitting: General Surgery

## 2015-09-21 ENCOUNTER — Encounter: Payer: Self-pay | Admitting: *Deleted

## 2015-09-21 ENCOUNTER — Ambulatory Visit: Payer: Self-pay | Admitting: General Surgery

## 2015-09-23 LAB — SURGICAL PATHOLOGY

## 2015-09-28 ENCOUNTER — Encounter: Payer: Self-pay | Admitting: General Surgery

## 2015-09-28 ENCOUNTER — Ambulatory Visit (INDEPENDENT_AMBULATORY_CARE_PROVIDER_SITE_OTHER): Payer: Medicare Other | Admitting: General Surgery

## 2015-09-28 VITALS — BP 170/98 | HR 76 | Resp 14 | Ht 62.0 in | Wt 118.0 lb

## 2015-09-28 DIAGNOSIS — K439 Ventral hernia without obstruction or gangrene: Secondary | ICD-10-CM

## 2015-09-28 NOTE — Progress Notes (Signed)
Patient ID: Jane Griffin, female   DOB: 09-24-1940, 75 y.o.   MRN: [id number]  Chief Complaint  Patient presents with  . Routine Post Op    umbililcal hernia    HPI Jane Griffin is a 75 y.o. female here today for her post op right femoral repair done on 09/18/15. Patient states she is doing well. She presented with an incarcerated right femoral hernia on 09/18/15. I have reviewed the history of present illness with the patient.  HPI  Past Medical History:  Diagnosis Date  . Allergic rhinitis   . Cervical disc disease   . Chronic kidney disease    STAGE 3  . GERD (gastroesophageal reflux disease)   . H/O blood clots    around her 40's  . Hyperlipidemia   . Hypertension   . PONV (postoperative nausea and vomiting)    nausea  . Shingles     Past Surgical History:  Procedure Laterality Date  . ABDOMINAL HYSTERECTOMY    . COLONOSCOPY  2013  . ELBOW SURGERY Left   . FEMORAL HERNIA REPAIR Right 09/18/2015   Procedure: HERNIA REPAIR femoral  ADULT;  Surgeon: Christene Lye, MD;  Location: [facility];  Service: General;  Laterality: Right;  . HERNIA REPAIR    . LAPAROSCOPIC BILATERAL SALPINGO OOPHERECTOMY Bilateral 09/06/2015   Procedure: LAPAROSCOPIC BILATERAL SALPINGO OOPHORECTOMY;  Surgeon: Boykin Nearing, MD;  Location: [facility];  Service: Gynecology;  Laterality: Bilateral;  . PARTIAL HYSTERECTOMY    . TUBAL LIGATION    . UPPER GI ENDOSCOPY    . WRIST SURGERY Right     Family History  Problem Relation Age of Onset  . Clotting disorder Mother   . Heart failure Mother   . Emphysema Father   . Heart failure Father   . Breast cancer Daughter 20    Social History Social History  Substance Use Topics  . Smoking status: Never Smoker  . Smokeless tobacco: Never Used  . Alcohol use No    Allergies  Allergen Reactions  . Codeine Nausea Only  . Gabapentin     nightmares  . Lipitor [Atorvastatin]     Aches   . Pravachol [Pravastatin Sodium]     aches   . Tramadol     nausea  . Aleve [Naproxen Sodium] Rash  . Sulfa Antibiotics Itching and Rash    Current Outpatient Prescriptions  Medication Sig Dispense Refill  . albuterol (PROVENTIL HFA;VENTOLIN HFA) 108 (90 Base) MCG/ACT inhaler Inhale 2 puffs into the lungs every 6 (six) hours as needed for wheezing or shortness of breath.    Marland Kitchen aspirin EC 81 MG tablet Take 81 mg by mouth every morning.     . Azelastine-Fluticasone (DYMISTA) 137-50 MCG/ACT SUSP Place into the nose 2 (two) times daily as needed.    . fluvastatin XL (LESCOL XL) 80 MG 24 hr tablet Take 80 mg by mouth daily.  2  . losartan (COZAAR) 100 MG tablet TAKE 1 TABLET (100 MG TOTAL) BY MOUTH ONCE DAILY.  3  . pantoprazole (PROTONIX) 40 MG tablet TAKE 1 TABLET BY MOUTH ONCE A DAY 30-60 MINUTES BEFORE FIRST MEAL OF THE DAY 90 tablet 1   No current facility-administered medications for this visit.     Review of Systems Review of Systems  Constitutional: Negative.   Respiratory: Negative.   Cardiovascular: Negative.     Blood pressure (!) 170/98, pulse 76, resp. rate 14, height 5\' 2"  (1.575 m), weight 118 lb (53.5 kg).  Physical Exam Physical Exam  Constitutional: She is oriented to person, place, and time. She appears well-developed and well-nourished.  Abdominal: Soft. Normal appearance and bowel sounds are normal. There is no tenderness. No hernia.  Hernia repair is intact and healing well.   Neurological: She is alert and oriented to person, place, and time.  Skin: Skin is warm and dry.    Data Reviewed Notes and path reviewed The very tip of appendix was sen on path- this was suture ligated at surgery and no risk of leak from it. Assessment    Stable exam, intact right femoral hernia repair    Plan   May drive and light activity is ok. Patient to return in one month.  This information has been scribed by Gaspar Cola CMA.       SANKAR,SEEPLAPUTHUR G 09/28/2015, 11:12 AM

## 2015-09-28 NOTE — Patient Instructions (Addendum)
Patient to return in one month. 

## 2015-10-27 DIAGNOSIS — I700 Atherosclerosis of aorta: Secondary | ICD-10-CM | POA: Insufficient documentation

## 2015-10-28 ENCOUNTER — Ambulatory Visit (INDEPENDENT_AMBULATORY_CARE_PROVIDER_SITE_OTHER): Payer: Medicare Other | Admitting: General Surgery

## 2015-10-28 ENCOUNTER — Encounter: Payer: Self-pay | Admitting: General Surgery

## 2015-10-28 VITALS — BP 126/72 | HR 66 | Resp 12 | Ht 62.0 in | Wt 119.0 lb

## 2015-10-28 DIAGNOSIS — K439 Ventral hernia without obstruction or gangrene: Secondary | ICD-10-CM

## 2015-10-28 NOTE — Progress Notes (Signed)
Patient ID: Jane Griffin, female   DOB: [birth year], 75 y.o.   MRN: [id number]  Chief Complaint  Patient presents with  . Routine Post Op    hernia    HPI Jane Griffin is a 75 y.o. female is here today for a post op for femoral hernia repair done 09/18/15. Patient states she is doing well and  moving bowels regularly.  I have reviewed the history of present illness with the patient.  HPI  Past Medical History:  Diagnosis Date  . Allergic rhinitis   . Cervical disc disease   . Chronic kidney disease    STAGE 3  . GERD (gastroesophageal reflux disease)   . H/O blood clots    around her 12's  . Hyperlipidemia   . Hypertension   . PONV (postoperative nausea and vomiting)    nausea  . Shingles     Past Surgical History:  Procedure Laterality Date  . ABDOMINAL HYSTERECTOMY    . COLONOSCOPY  2013  . ELBOW SURGERY Left   . FEMORAL HERNIA REPAIR Right 09/18/2015   Procedure: HERNIA REPAIR femoral  ADULT;  Surgeon: Christene Lye, MD;  Location: [facility];  Service: General;  Laterality: Right;  . HERNIA REPAIR    . LAPAROSCOPIC BILATERAL SALPINGO OOPHERECTOMY Bilateral 09/06/2015   Procedure: LAPAROSCOPIC BILATERAL SALPINGO OOPHORECTOMY;  Surgeon: Boykin Nearing, MD;  Location: [facility];  Service: Gynecology;  Laterality: Bilateral;  . PARTIAL HYSTERECTOMY    . TUBAL LIGATION    . UPPER GI ENDOSCOPY    . WRIST SURGERY Right     Family History  Problem Relation Age of Onset  . Clotting disorder Mother   . Heart failure Mother   . Emphysema Father   . Heart failure Father   . Breast cancer Daughter 10    Social History Social History  Substance Use Topics  . Smoking status: Never Smoker  . Smokeless tobacco: Never Used  . Alcohol use No    Allergies  Allergen Reactions  . Codeine Nausea Only  . Gabapentin     nightmares  . Lipitor [Atorvastatin]     Aches   . Pravachol [Pravastatin Sodium]     aches  . Tramadol     nausea  . Aleve [Naproxen  Sodium] Rash  . Sulfa Antibiotics Itching and Rash    Current Outpatient Prescriptions  Medication Sig Dispense Refill  . albuterol (PROVENTIL HFA;VENTOLIN HFA) 108 (90 Base) MCG/ACT inhaler Inhale 2 puffs into the lungs every 6 (six) hours as needed for wheezing or shortness of breath.    Marland Kitchen aspirin EC 81 MG tablet Take 81 mg by mouth every morning.     . Azelastine-Fluticasone (DYMISTA) 137-50 MCG/ACT SUSP Place into the nose 2 (two) times daily as needed.    . fluvastatin XL (LESCOL XL) 80 MG 24 hr tablet Take 80 mg by mouth daily.  2  . losartan (COZAAR) 100 MG tablet TAKE 1 TABLET (100 MG TOTAL) BY MOUTH ONCE DAILY.  3  . pantoprazole (PROTONIX) 40 MG tablet TAKE 1 TABLET BY MOUTH ONCE A DAY 30-60 MINUTES BEFORE FIRST MEAL OF THE DAY 90 tablet 1   No current facility-administered medications for this visit.     Review of Systems Review of Systems  Constitutional: Negative.   Respiratory: Negative.   Cardiovascular: Negative.     Blood pressure 126/72, pulse 66, resp. rate 12, height 5\' 2"  (1.575 m), weight 119 lb (54 kg).  Physical Exam Physical Exam  Constitutional: She is oriented to person, place, and time. She appears well-developed and well-nourished.  Neurological: She is alert and oriented to person, place, and time.  Right groin incision is well healed. No hernia noted  Data Reviewed Prior notes reviewed   Assessment    Stable exam    Plan    Patient to return as needed. No activity restrictions This information has been scribed by Gaspar Cola CMA.        SANKAR,SEEPLAPUTHUR G 10/30/2015, 9:44 AM

## 2015-10-28 NOTE — Patient Instructions (Signed)
Return as needed

## 2015-10-30 ENCOUNTER — Encounter: Payer: Self-pay | Admitting: General Surgery

## 2016-05-24 ENCOUNTER — Other Ambulatory Visit: Payer: Self-pay | Admitting: Internal Medicine

## 2016-05-24 DIAGNOSIS — Z1231 Encounter for screening mammogram for malignant neoplasm of breast: Secondary | ICD-10-CM

## 2016-06-09 ENCOUNTER — Ambulatory Visit
Admission: RE | Admit: 2016-06-09 | Discharge: 2016-06-09 | Disposition: A | Discharge status: Home / Self Care | Payer: Medicare Other | Source: Ambulatory Visit | Attending: Internal Medicine | Admitting: Internal Medicine

## 2016-06-09 DIAGNOSIS — Z1231 Encounter for screening mammogram for malignant neoplasm of breast: Secondary | ICD-10-CM

## 2016-09-21 ENCOUNTER — Ambulatory Visit (INDEPENDENT_AMBULATORY_CARE_PROVIDER_SITE_OTHER): Payer: Medicare Other | Admitting: General Surgery

## 2016-09-21 ENCOUNTER — Encounter: Payer: Self-pay | Admitting: General Surgery

## 2016-09-21 VITALS — BP 110/64 | HR 80 | Resp 14 | Ht 62.0 in | Wt 124.0 lb

## 2016-09-21 DIAGNOSIS — Z1211 Encounter for screening for malignant neoplasm of colon: Secondary | ICD-10-CM

## 2016-09-21 DIAGNOSIS — Z86010 Personal history of colonic polyps: Secondary | ICD-10-CM

## 2016-09-21 MED ORDER — POLYETHYLENE GLYCOL 3350 17 GM/SCOOP PO POWD: ORAL | 0 refills | Status: DC

## 2016-09-21 NOTE — Progress Notes (Signed)
Patient ID: Jane Griffin, female   DOB: 02/15/1940, 76 y.o.   MRN: [id number]  Chief Complaint  Patient presents with  . Colonoscopy    HPI Jane Griffin is a 76 y.o. female here for evaluation for a colonoscopy. Her last one was in 2013. She does report that she has loose stool 2-3 times daily. This has been her normal for many years. She reports no other GI problems.  HPI  Past Medical History:  Diagnosis Date  . Allergic rhinitis   . Cervical disc disease   . Chronic kidney disease    STAGE 3  . GERD (gastroesophageal reflux disease)   . H/O blood clots    around her 20's  . History of colon polyps   . Hyperlipidemia   . Hypertension   . PONV (postoperative nausea and vomiting)    nausea  . Shingles     Past Surgical History:  Procedure Laterality Date  . ABDOMINAL HYSTERECTOMY    . COLONOSCOPY  2013  . ELBOW SURGERY Left   . FEMORAL HERNIA REPAIR Right 09/18/2015   Procedure: HERNIA REPAIR femoral  ADULT;  Surgeon: Seeplaputhur G Sankar, MD;  Location: [facility];  Service: General;  Laterality: Right;  . HERNIA REPAIR    . LAPAROSCOPIC BILATERAL SALPINGO OOPHERECTOMY Bilateral 09/06/2015   Procedure: LAPAROSCOPIC BILATERAL SALPINGO OOPHORECTOMY;  Surgeon: Thomas J Schermerhorn, MD;  Location: [facility];  Service: Gynecology;  Laterality: Bilateral;  . PARTIAL HYSTERECTOMY    . TUBAL LIGATION    . UPPER GI ENDOSCOPY    . WRIST SURGERY Right     Family History  Problem Relation Age of Onset  . Clotting disorder Mother   . Heart failure Mother   . Emphysema Father   . Heart failure Father   . Breast cancer Daughter 48    Social History Social History  Substance Use Topics  . Smoking status: Never Smoker  . Smokeless tobacco: Never Used  . Alcohol use No    Allergies  Allergen Reactions  . Codeine Nausea Only  . Gabapentin     nightmares  . Lipitor [Atorvastatin]     Aches   . Pravachol [Pravastatin Sodium]     aches  . Tramadol     nausea  . Aleve  [Naproxen Sodium] Rash  . Sulfa Antibiotics Itching and Rash    Current Outpatient Prescriptions  Medication Sig Dispense Refill  . albuterol (PROVENTIL HFA;VENTOLIN HFA) 108 (90 Base) MCG/ACT inhaler Inhale 2 puffs into the lungs every 6 (six) hours as needed for wheezing or shortness of breath.    . aspirin EC 81 MG tablet Take 81 mg by mouth every morning.     . Azelastine-Fluticasone (DYMISTA) 137-50 MCG/ACT SUSP Place into the nose 2 (two) times daily as needed.    . fluvastatin XL (LESCOL XL) 80 MG 24 hr tablet Take 80 mg by mouth daily.  2  . losartan (COZAAR) 100 MG tablet TAKE 1 TABLET (100 MG TOTAL) BY MOUTH ONCE DAILY.  3  . pantoprazole (PROTONIX) 40 MG tablet TAKE 1 TABLET BY MOUTH ONCE A DAY 30-60 MINUTES BEFORE FIRST MEAL OF THE DAY 90 tablet 1  . polyethylene glycol powder (GLYCOLAX/MIRALAX) powder 255 grams one bottle for colonoscopy prep 255 g 0   No current facility-administered medications for this visit.     Review of Systems Review of Systems  Constitutional: Negative.   Respiratory: Negative.   Cardiovascular: Negative.   Gastrointestinal: Negative.       Blood pressure 110/64, pulse 80, resp. rate 14, height 5' 2" (1.575 m), weight 124 lb (56.2 kg).  Physical Exam Physical Exam  Constitutional: She is oriented to person, place, and time. She appears well-developed and well-nourished.  Eyes: Conjunctivae are normal. No scleral icterus.  Neck: Neck supple.  Cardiovascular: Normal rate, regular rhythm and normal heart sounds.   Pulmonary/Chest: Effort normal and breath sounds normal.  Abdominal: Soft. Bowel sounds are normal. There is no hepatomegaly. There is no tenderness. No hernia.  Lymphadenopathy:    She has no cervical adenopathy.  Neurological: She is alert and oriented to person, place, and time.  Skin: Skin is warm and dry.    Data Reviewed Office notes Previous Colonoscopy  Assessment    History of colon polyp. Patient due for 5 years  surveillance colonoscopy.    Plan    Patient is agreeable to proceed with colonoscopy    Colonoscopy with possible biopsy/polypectomy prn: Information regarding the procedure, including its potential risks and complications (including but not limited to perforation of the bowel, which may require emergency surgery to repair, and bleeding) was verbally given to the patient. Educational information regarding lower intestinal endoscopy was given to the patient. Written instructions for how to complete the bowel prep using Miralax were provided. The importance of drinking ample fluids to avoid dehydration as a result of the prep emphasized.  HPI, Physical Exam, Assessment and Plan have been scribed under the direction and in the presence of S. G. Sankar, MD  Caryl-Lyn Kennedy, LPN I have completed the exam and reviewed the above documentation for accuracy and completeness.  I agree with the above.  [employer] has been used and any errors in dictation or transcription are unintentional.  Seeplaputhur G. Sankar, M.D., F.A.C.S.    SANKAR,SEEPLAPUTHUR G 09/25/2016, 8:27 AM  Patient has been scheduled for a colonoscopy on 10-11-16 at [facility]. Miralax prescription has been sent in to the patient's pharmacy today. She will only take her blood pressure medication the morning of procedure with a small sip of water at 6 am. Colonoscopy instructions have been reviewed with the patient. This patient is aware to call the office if they have further questions.   Michele J. Bailey, CMA    

## 2016-09-21 NOTE — Patient Instructions (Signed)
Colonoscopy, Adult  A colonoscopy is an exam to look at the large intestine. It is done to check for problems, such as:  · Lumps (tumors).  · Growths (polyps).  · Swelling (inflammation).  · Bleeding.    What happens before the procedure?  Eating and drinking   Follow instructions from your doctor about eating and drinking. These instructions may include:  · A few days before the procedure - follow a low-fiber diet.  ? Avoid nuts.  ? Avoid seeds.  ? Avoid dried fruit.  ? Avoid raw fruits.  ? Avoid vegetables.  · 1-3 days before the procedure - follow a clear liquid diet. Avoid liquids that have red or purple dye. Drink only clear liquids, such as:  ? Clear broth or bouillon.  ? Black coffee or tea.  ? Clear juice.  ? Clear soft drinks or sports drinks.  ? Gelatin dessert.  ? Popsicles.  · On the day of the procedure - do not eat or drink anything during the 2 hours before the procedure.    Bowel prep   If you were prescribed an oral bowel prep:  · Take it as told by your doctor. Starting the day before your procedure, you will need to drink a lot of liquid. The liquid will cause you to poop (have bowel movements) until your poop is almost clear or light green.  · If your skin or butt gets irritated from diarrhea, you may:  ? Wipe the area with wipes that have medicine in them, such as adult wet wipes with aloe and vitamin E.  ? Put something on your skin that soothes the area, such as petroleum jelly.  · If you throw up (vomit) while drinking the bowel prep, take a break for up to 60 minutes. Then begin the bowel prep again. If you keep throwing up and you cannot take the bowel prep without throwing up, call your doctor.    General instructions   · Ask your doctor about changing or stopping your normal medicines. This is important if you take diabetes medicines or blood thinners.  · Plan to have someone take you home from the hospital or clinic.  What happens during the procedure?  · An IV tube may be put into one  of your veins.  · You will be given medicine to help you relax (sedative).  · To reduce your risk of infection:  ? Your doctors will wash their hands.  ? Your anal area will be washed with soap.  · You will be asked to lie on your side with your knees bent.  · Your doctor will get a long, thin, flexible tube ready. The tube will have a camera and a light on the end.  · The tube will be put into your anus.  · The tube will be gently put into your large intestine.  · Air will be delivered into your large intestine to keep it open. You may feel some pressure or cramping.  · The camera will be used to take photos.  · A small tissue sample may be removed from your body to be looked at under a microscope (biopsy). If any possible problems are found, the tissue will be sent to a lab for testing.  · If small growths are found, your doctor may remove them and have them checked for cancer.  · The tube that was put into your anus will be slowly removed.  The procedure may vary among doctors   and hospitals.  What happens after the procedure?  · Your doctor will check on you often until the medicines you were given have worn off.  · Do not drive for 24 hours after the procedure.  · You may have a small amount of blood in your poop.  · You may pass gas.  · You may have mild cramps or bloating in your belly (abdomen).  · It is up to you to get the results of your procedure. Ask your doctor, or the department performing the procedure, when your results will be ready.  This information is not intended to replace advice given to you by your health care provider. Make sure you discuss any questions you have with your health care provider.  Document Released: 02/25/2010 Document Revised: 11/24/2015 Document Reviewed: 04/06/2015  Elsevier Interactive Patient Education © 2017 [employer]

## 2016-10-05 ENCOUNTER — Other Ambulatory Visit: Payer: Self-pay | Admitting: General Surgery

## 2016-10-05 DIAGNOSIS — Z86010 Personal history of colonic polyps: Secondary | ICD-10-CM

## 2016-10-11 ENCOUNTER — Ambulatory Visit
Admission: RE | Admit: 2016-10-11 | Discharge: 2016-10-11 | Disposition: A | Discharge status: Home / Self Care | Payer: Medicare Other | Source: Ambulatory Visit | Attending: General Surgery | Admitting: General Surgery

## 2016-10-11 ENCOUNTER — Ambulatory Visit: Payer: Medicare Other | Admitting: Anesthesiology

## 2016-10-11 ENCOUNTER — Encounter: Payer: Self-pay | Admitting: Anesthesiology

## 2016-10-11 ENCOUNTER — Encounter
Admission: RE | Disposition: A | Discharge status: Home / Self Care | Payer: Self-pay | Source: Ambulatory Visit | Attending: General Surgery

## 2016-10-11 DIAGNOSIS — D120 Benign neoplasm of cecum: Secondary | ICD-10-CM | POA: Insufficient documentation

## 2016-10-11 DIAGNOSIS — Z86718 Personal history of other venous thrombosis and embolism: Secondary | ICD-10-CM | POA: Diagnosis not present

## 2016-10-11 DIAGNOSIS — Z888 Allergy status to other drugs, medicaments and biological substances status: Secondary | ICD-10-CM | POA: Insufficient documentation

## 2016-10-11 DIAGNOSIS — Z8619 Personal history of other infectious and parasitic diseases: Secondary | ICD-10-CM | POA: Insufficient documentation

## 2016-10-11 DIAGNOSIS — Z882 Allergy status to sulfonamides status: Secondary | ICD-10-CM | POA: Diagnosis not present

## 2016-10-11 DIAGNOSIS — I129 Hypertensive chronic kidney disease with stage 1 through stage 4 chronic kidney disease, or unspecified chronic kidney disease: Secondary | ICD-10-CM | POA: Insufficient documentation

## 2016-10-11 DIAGNOSIS — Z7982 Long term (current) use of aspirin: Secondary | ICD-10-CM | POA: Insufficient documentation

## 2016-10-11 DIAGNOSIS — N183 Chronic kidney disease, stage 3 (moderate): Secondary | ICD-10-CM | POA: Diagnosis not present

## 2016-10-11 DIAGNOSIS — Z1211 Encounter for screening for malignant neoplasm of colon: Secondary | ICD-10-CM | POA: Diagnosis present

## 2016-10-11 DIAGNOSIS — Z79899 Other long term (current) drug therapy: Secondary | ICD-10-CM | POA: Diagnosis not present

## 2016-10-11 DIAGNOSIS — E785 Hyperlipidemia, unspecified: Secondary | ICD-10-CM | POA: Insufficient documentation

## 2016-10-11 DIAGNOSIS — D122 Benign neoplasm of ascending colon: Secondary | ICD-10-CM | POA: Insufficient documentation

## 2016-10-11 DIAGNOSIS — Z90710 Acquired absence of both cervix and uterus: Secondary | ICD-10-CM | POA: Diagnosis not present

## 2016-10-11 DIAGNOSIS — K644 Residual hemorrhoidal skin tags: Secondary | ICD-10-CM | POA: Diagnosis not present

## 2016-10-11 DIAGNOSIS — Z885 Allergy status to narcotic agent status: Secondary | ICD-10-CM | POA: Diagnosis not present

## 2016-10-11 DIAGNOSIS — Z886 Allergy status to analgesic agent status: Secondary | ICD-10-CM | POA: Insufficient documentation

## 2016-10-11 DIAGNOSIS — Z86010 Personal history of colonic polyps: Secondary | ICD-10-CM

## 2016-10-11 DIAGNOSIS — K219 Gastro-esophageal reflux disease without esophagitis: Secondary | ICD-10-CM | POA: Insufficient documentation

## 2016-10-11 DIAGNOSIS — K5730 Diverticulosis of large intestine without perforation or abscess without bleeding: Secondary | ICD-10-CM

## 2016-10-11 HISTORY — PX: COLONOSCOPY WITH PROPOFOL: SHX5780

## 2016-10-11 SURGERY — COLONOSCOPY WITH PROPOFOL
Anesthesia: General

## 2016-10-11 MED ORDER — PROPOFOL 500 MG/50ML IV EMUL
INTRAVENOUS | Status: AC
  Filled 2016-10-11: qty 50

## 2016-10-11 MED ORDER — LIDOCAINE HCL (PF) 1 % IJ SOLN
2.0000 mL | Freq: Once | INTRAMUSCULAR | Status: AC
  Administered 2016-10-11: 0.3 mL via INTRADERMAL

## 2016-10-11 MED ORDER — FENTANYL CITRATE (PF) 100 MCG/2ML IJ SOLN: 25.0000 ug | INTRAMUSCULAR | Status: DC | PRN

## 2016-10-11 MED ORDER — PROPOFOL 500 MG/50ML IV EMUL
INTRAVENOUS | Status: DC | PRN
  Administered 2016-10-11: 100 ug/kg/min via INTRAVENOUS

## 2016-10-11 MED ORDER — ONDANSETRON HCL 4 MG/2ML IJ SOLN: 4.0000 mg | Freq: Once | INTRAMUSCULAR | Status: DC | PRN

## 2016-10-11 MED ORDER — SODIUM CHLORIDE 0.9 % IV SOLN
INTRAVENOUS | Status: DC
  Administered 2016-10-11: 1000 mL via INTRAVENOUS

## 2016-10-11 MED ORDER — MIDAZOLAM HCL 2 MG/2ML IJ SOLN
INTRAMUSCULAR | Status: AC
  Filled 2016-10-11: qty 2

## 2016-10-11 MED ORDER — LIDOCAINE HCL (PF) 1 % IJ SOLN
INTRAMUSCULAR | Status: AC
  Administered 2016-10-11: 0.3 mL via INTRADERMAL
  Filled 2016-10-11: qty 2

## 2016-10-11 MED ORDER — PROPOFOL 10 MG/ML IV BOLUS
INTRAVENOUS | Status: DC | PRN
  Administered 2016-10-11: 80 mg via INTRAVENOUS
  Administered 2016-10-11: 40 mg via INTRAVENOUS

## 2016-10-11 NOTE — Anesthesia Postprocedure Evaluation (Signed)
Anesthesia Post Note  Patient: Jane Griffin  Procedure(s) Performed: Procedure(s) (LRB): COLONOSCOPY WITH PROPOFOL (N/A)  Patient location during evaluation: PACU Anesthesia Type: General Level of consciousness: awake and alert and oriented Pain management: pain level controlled Vital Signs Assessment: post-procedure vital signs reviewed and stable Respiratory status: spontaneous breathing Cardiovascular status: blood pressure returned to baseline Anesthetic complications: no     Last Vitals:  Vitals:   10/11/16 1024 10/11/16 1034  BP: 135/69 138/63  Pulse: 67 71  Resp: (!) 21 14  Temp:    SpO2: 100% 100%    Last Pain:  Vitals:   10/11/16 1004  TempSrc: Tympanic                 Harlee Eckroth

## 2016-10-11 NOTE — Anesthesia Preprocedure Evaluation (Signed)
Anesthesia Evaluation  Patient identified by MRN, date of birth, ID band Patient awake    Reviewed: Allergy & Precautions, NPO status , Patient's Chart, lab work & pertinent test results  History of Anesthesia Complications (+) PONV and history of anesthetic complications  Airway Mallampati: II  TM Distance: >3 FB Neck ROM: Full    Dental no notable dental hx.    Pulmonary neg COPD,    breath sounds clear to auscultation- rhonchi (-) wheezing      Cardiovascular Exercise Tolerance: Good hypertension, Pt. on medications (-) CAD and (-) Past MI  Rhythm:Regular Rate:Normal - Systolic murmurs and - Diastolic murmurs    Neuro/Psych Shingles Hx negative neurological ROS  negative psych ROS   GI/Hepatic Neg liver ROS, GERD  Medicated,  Endo/Other  negative endocrine ROSneg diabetes  Renal/GU Renal InsufficiencyRenal disease     Musculoskeletal negative musculoskeletal ROS (+)   Abdominal (+) - obese,   Peds  Hematology negative hematology ROS (+)   Anesthesia Other Findings Past Medical History: No date: Allergic rhinitis No date: Cervical disc disease No date: Chronic kidney disease     Comment:  STAGE 3 No date: GERD (gastroesophageal reflux disease) No date: H/O blood clots     Comment:  around her 38's No date: History of colon polyps No date: Hyperlipidemia No date: Hypertension No date: PONV (postoperative nausea and vomiting)     Comment:  nausea No date: Shingles  Reproductive/Obstetrics                             Anesthesia Physical  Anesthesia Plan  ASA: III  Anesthesia Plan: General   Post-op Pain Management:    Induction: Intravenous  PONV Risk Score and Plan:   Airway Management Planned: Nasal Cannula  Additional Equipment:   Intra-op Plan:   Post-operative Plan:   Informed Consent: I have reviewed the patients History and Physical, chart, labs and  discussed the procedure including the risks, benefits and alternatives for the proposed anesthesia with the patient or authorized representative who has indicated his/her understanding and acceptance.   Dental advisory given  Plan Discussed with: CRNA and Surgeon  Anesthesia Plan Comments:         Anesthesia Quick Evaluation

## 2016-10-11 NOTE — Interval H&P Note (Signed)
History and Physical Interval Note:  10/11/2016 9:03 AM  Jane Griffin  has presented today for surgery, with the diagnosis of HX COLON POLYPS  The various methods of treatment have been discussed with the patient and family. After consideration of risks, benefits and other options for treatment, the patient has consented to  Procedure(s): COLONOSCOPY WITH PROPOFOL (N/A) as a surgical intervention .  The patient's history has been reviewed, patient examined, no change in status, stable for surgery.  I have reviewed the patient's chart and labs.  Questions were answered to the patient's satisfaction.     SANKAR,SEEPLAPUTHUR G

## 2016-10-11 NOTE — H&P (View-Only) (Signed)
Patient ID: Jane Griffin, female   DOB: 04/30/1940, 76 y.o.   MRN: [id number]  Chief Complaint  Patient presents with  . Colonoscopy    HPI Jane Griffin is a 76 y.o. female here for evaluation for a colonoscopy. Her last one was in 2013. She does report that she has loose stool 2-3 times daily. This has been her normal for many years. She reports no other GI problems.  HPI  Past Medical History:  Diagnosis Date  . Allergic rhinitis   . Cervical disc disease   . Chronic kidney disease    STAGE 3  . GERD (gastroesophageal reflux disease)   . H/O blood clots    around her 24's  . History of colon polyps   . Hyperlipidemia   . Hypertension   . PONV (postoperative nausea and vomiting)    nausea  . Shingles     Past Surgical History:  Procedure Laterality Date  . ABDOMINAL HYSTERECTOMY    . COLONOSCOPY  2013  . ELBOW SURGERY Left   . FEMORAL HERNIA REPAIR Right 09/18/2015   Procedure: HERNIA REPAIR femoral  ADULT;  Surgeon: Christene Lye, MD;  Location: [facility] ORS;  Service: General;  Laterality: Right;  . HERNIA REPAIR    . LAPAROSCOPIC BILATERAL SALPINGO OOPHERECTOMY Bilateral 09/06/2015   Procedure: LAPAROSCOPIC BILATERAL SALPINGO OOPHORECTOMY;  Surgeon: Boykin Nearing, MD;  Location: [facility] ORS;  Service: Gynecology;  Laterality: Bilateral;  . PARTIAL HYSTERECTOMY    . TUBAL LIGATION    . UPPER GI ENDOSCOPY    . WRIST SURGERY Right     Family History  Problem Relation Age of Onset  . Clotting disorder Mother   . Heart failure Mother   . Emphysema Father   . Heart failure Father   . Breast cancer Daughter 53    Social History Social History  Substance Use Topics  . Smoking status: Never Smoker  . Smokeless tobacco: Never Used  . Alcohol use No    Allergies  Allergen Reactions  . Codeine Nausea Only  . Gabapentin     nightmares  . Lipitor [Atorvastatin]     Aches   . Pravachol [Pravastatin Sodium]     aches  . Tramadol     nausea  . Aleve  [Naproxen Sodium] Rash  . Sulfa Antibiotics Itching and Rash    Current Outpatient Prescriptions  Medication Sig Dispense Refill  . albuterol (PROVENTIL HFA;VENTOLIN HFA) 108 (90 Base) MCG/ACT inhaler Inhale 2 puffs into the lungs every 6 (six) hours as needed for wheezing or shortness of breath.    Marland Kitchen aspirin EC 81 MG tablet Take 81 mg by mouth every morning.     . Azelastine-Fluticasone (DYMISTA) 137-50 MCG/ACT SUSP Place into the nose 2 (two) times daily as needed.    . fluvastatin XL (LESCOL XL) 80 MG 24 hr tablet Take 80 mg by mouth daily.  2  . losartan (COZAAR) 100 MG tablet TAKE 1 TABLET (100 MG TOTAL) BY MOUTH ONCE DAILY.  3  . pantoprazole (PROTONIX) 40 MG tablet TAKE 1 TABLET BY MOUTH ONCE A DAY 30-60 MINUTES BEFORE FIRST MEAL OF THE DAY 90 tablet 1  . polyethylene glycol powder (GLYCOLAX/MIRALAX) powder 255 grams one bottle for colonoscopy prep 255 g 0   No current facility-administered medications for this visit.     Review of Systems Review of Systems  Constitutional: Negative.   Respiratory: Negative.   Cardiovascular: Negative.   Gastrointestinal: Negative.  Blood pressure 110/64, pulse 80, resp. rate 14, height 5\' 2"  (1.575 m), weight 124 lb (56.2 kg).  Physical Exam Physical Exam  Constitutional: She is oriented to person, place, and time. She appears well-developed and well-nourished.  Eyes: Conjunctivae are normal. No scleral icterus.  Neck: Neck supple.  Cardiovascular: Normal rate, regular rhythm and normal heart sounds.   Pulmonary/Chest: Effort normal and breath sounds normal.  Abdominal: Soft. Bowel sounds are normal. There is no hepatomegaly. There is no tenderness. No hernia.  Lymphadenopathy:    She has no cervical adenopathy.  Neurological: She is alert and oriented to person, place, and time.  Skin: Skin is warm and dry.    Data Reviewed Office notes Previous Colonoscopy  Assessment    History of colon polyp. Patient due for 5 years  surveillance colonoscopy.    Plan    Patient is agreeable to proceed with colonoscopy    Colonoscopy with possible biopsy/polypectomy prn: Information regarding the procedure, including its potential risks and complications (including but not limited to perforation of the bowel, which may require emergency surgery to repair, and bleeding) was verbally given to the patient. Educational information regarding lower intestinal endoscopy was given to the patient. Written instructions for how to complete the bowel prep using Miralax were provided. The importance of drinking ample fluids to avoid dehydration as a result of the prep emphasized.  HPI, Physical Exam, Assessment and Plan have been scribed under the direction and in the presence of Mckinley Jewel, MD  [name], LPN I have completed the exam and reviewed the above documentation for accuracy and completeness.  I agree with the above.  Haematologist has been used and any errors in dictation or transcription are unintentional.  [name]. Jamal Collin, M.D., F.A.C.S.    [name] 09/25/2016, 8:27 AM  Patient has been scheduled for a colonoscopy on 10-11-16 at [facility]. Miralax prescription has been sent in to the patient's pharmacy today. She will only take her blood pressure medication the morning of procedure with a small sip of water at 6 am. Colonoscopy instructions have been reviewed with the patient. This patient is aware to call the office if they have further questions.   Dominga Ferry, CMA

## 2016-10-11 NOTE — Op Note (Signed)
[facility] Patient Name: Jane Griffin Procedure Date: 10/11/2016 9:00 AM MRN: [id number] Account #: [id number] Date of Birth: [birth year] Admit Type: Outpatient Age: 76 Room: [facility] ENDO ROOM 1 Gender: Female Note Status: Finalized Procedure:            Colonoscopy Indications:          High risk colon cancer surveillance: Personal history                        of colonic polyps Providers:            Ahmaad Neidhardt G. Jamal Collin, MD Referring MD:         Ocie Cornfield. Ouida Sills MD, MD (Referring MD) Medicines:            Total IV Anesthesia (TIVA) Complications:        No immediate complications. Procedure:            Pre-Anesthesia Assessment:                       - Using IV propofol under the supervision of an                        anesthesiologist was determined to be medically                        necessary for this procedure based on review of the                        patient's medical history, medications, and prior                        anesthesia history.                       After obtaining informed consent, the colonoscope was                        passed under direct vision. Throughout the procedure,                        the patient's blood pressure, pulse, and oxygen                        saturations were monitored continuously. The                        Colonoscope was introduced through the anus and                        advanced to the the terminal ileum, with identification                        of the ileocecal valve. The colonoscopy was performed                        without difficulty. The patient tolerated the procedure                        well. The quality of the bowel preparation was good. Findings:      The perianal exam findings include non-thrombosed external hemorrhoids.  A few medium-mouthed diverticula were found in the sigmoid colon.      A 2 mm polyp was found in the cecum. The polyp was sessile. Biopsies        were taken with a cold forceps for histology.      A 5 mm, non-bleeding polyp was found in the ileocecal valve. The polyp       was sessile. The polyp was removed with a hot snare. Resection and       retrieval were complete.      A 10 mm polyp was found in the proximal ascending colon. The polyp was       sessile. The polyp was removed with a hot snare. Resection and retrieval       were complete.      The exam was otherwise without abnormality. Impression:           - Non-thrombosed external hemorrhoids found on perianal                        exam.                       - Diverticulosis in the sigmoid colon.                       - One 2 mm polyp in the cecum. Biopsied.                       - One 5 mm, non-bleeding polyp at the ileocecal valve,                        removed with a hot snare. Resected and retrieved.                       - One 10 mm polyp in the proximal ascending colon,                        removed with a hot snare. Resected and retrieved.                       - The examination was otherwise normal. Recommendation:       - Await pathology results.                       - Repeat colonoscopy in 5 years for surveillance. Procedure Code(s):    --- Professional ---                       [phone], Colonoscopy, flexible; with removal of tumor(s),                        polyp(s), or other lesion(s) by snare technique                       45380, 56, Colonoscopy, flexible; with biopsy, single                        or multiple Diagnosis Code(s):    --- Professional ---                       Z86.010, Personal history of colonic polyps  K64.4, Residual hemorrhoidal skin tags                       D12.0, Benign neoplasm of cecum                       D12.2, Benign neoplasm of ascending colon                       K57.30, Diverticulosis of large intestine without                        perforation or abscess without bleeding CPT copyright 2016  American Medical Association. All rights reserved. The codes documented in this report are preliminary and upon coder review may  be revised to meet current compliance requirements. Christene Lye, MD 10/11/2016 10:07:48 AM This report has been signed electronically. Number of Addenda: 0 Note Initiated On: 10/11/2016 9:00 AM Scope Withdrawal Time: 0 hours 31 minutes 5 seconds  Total Procedure Duration: 0 hours 45 minutes 35 seconds       [facility]

## 2016-10-11 NOTE — Transfer of Care (Signed)
Immediate Anesthesia Transfer of Care Note  Patient: Jane Griffin  Procedure(s) Performed: Procedure(s): COLONOSCOPY WITH PROPOFOL (N/A)  Patient Location: PACU and Endoscopy Unit  Anesthesia Type:General  Level of Consciousness: drowsy and patient cooperative  Airway & Oxygen Therapy: Patient Spontanous Breathing and Patient connected to nasal cannula oxygen  Post-op Assessment: Report given to RN and Post -op Vital signs reviewed and stable  Post vital signs: Reviewed and stable  Last Vitals:  Vitals:   10/11/16 0813 10/11/16 1004  BP: (!) 145/67 (!) 122/96  Pulse: 66 62  Resp: 18 16  Temp: (!) 36 C (!) 36.2 C  SpO2: 99% 100%    Last Pain:  Vitals:   10/11/16 1004  TempSrc: Tympanic         Complications: No apparent anesthesia complications

## 2016-10-11 NOTE — Anesthesia Post-op Follow-up Note (Signed)
Anesthesia QCDR form completed.        

## 2016-10-12 ENCOUNTER — Encounter: Payer: Self-pay | Admitting: General Surgery

## 2016-10-12 LAB — SURGICAL PATHOLOGY

## 2016-10-13 ENCOUNTER — Telehealth: Payer: Self-pay | Admitting: *Deleted

## 2016-10-13 NOTE — Telephone Encounter (Signed)
-----   Message from Christene Lye, MD sent at 10/13/2016  7:18 AM EDT ----- Inform pt, polyps were benign. Needs colonoscopy in 5 yrs

## 2016-10-13 NOTE — Telephone Encounter (Signed)
Notified patient as instructed, patient pleased. Discussed follow-up appointments, patient agrees  

## 2017-06-01 ENCOUNTER — Other Ambulatory Visit: Payer: Self-pay | Admitting: Internal Medicine

## 2017-06-01 DIAGNOSIS — Z1231 Encounter for screening mammogram for malignant neoplasm of breast: Secondary | ICD-10-CM

## 2017-06-14 DIAGNOSIS — R55 Syncope and collapse: Secondary | ICD-10-CM | POA: Insufficient documentation

## 2017-06-21 ENCOUNTER — Ambulatory Visit
Admission: RE | Admit: 2017-06-21 | Discharge: 2017-06-21 | Disposition: A | Discharge status: Home / Self Care | Payer: Medicare Other | Source: Ambulatory Visit | Attending: Internal Medicine | Admitting: Internal Medicine

## 2017-06-21 DIAGNOSIS — Z1231 Encounter for screening mammogram for malignant neoplasm of breast: Secondary | ICD-10-CM | POA: Insufficient documentation

## 2017-09-12 IMAGING — CT CT ABD-PELV W/ CM
1 of 3 series · 13 of 32 positions shown, 18 images · IV contrast (iopamidol)
Comparison: 06/16/2015

CLINICAL DATA: Ten days postop from BSO. Worsening right pelvic
pain and swelling for 4 days.

EXAM:
CT ABDOMEN AND PELVIS WITH CONTRAST
TECHNIQUE: Multidetector CT imaging of the abdomen and pelvis was performed
using the standard protocol following bolus administration of
intravenous contrast.
CONTRAST:  80mL 6UV7MZ-8PP IOPAMIDOL (6UV7MZ-8PP) INJECTION 61%

[Series 2: axial st · axial · 0.66mm/px · z∈[-1044,-644]mm · 13 of 92 slices shown, 18 images]
[im 6/92  soft-tissue]
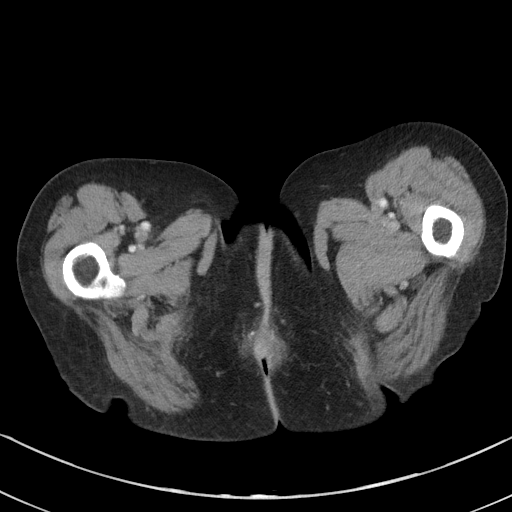
[im 6/92  bone]
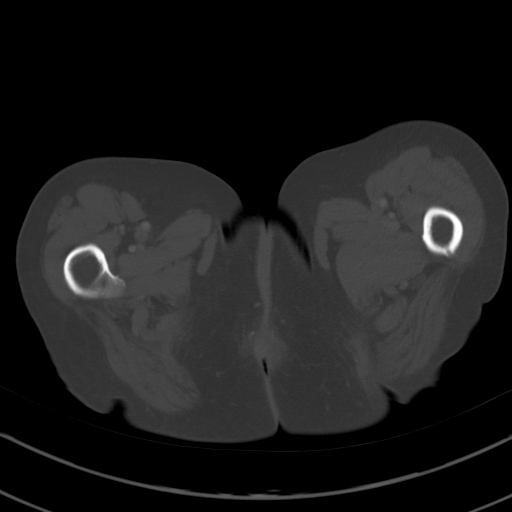
[im 16/92  soft-tissue]
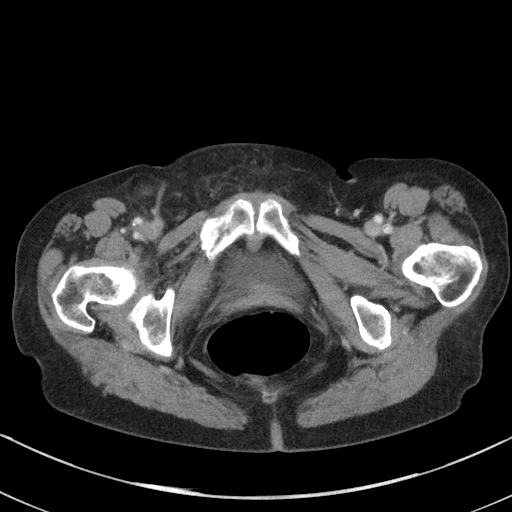
[im 21/92  soft-tissue]
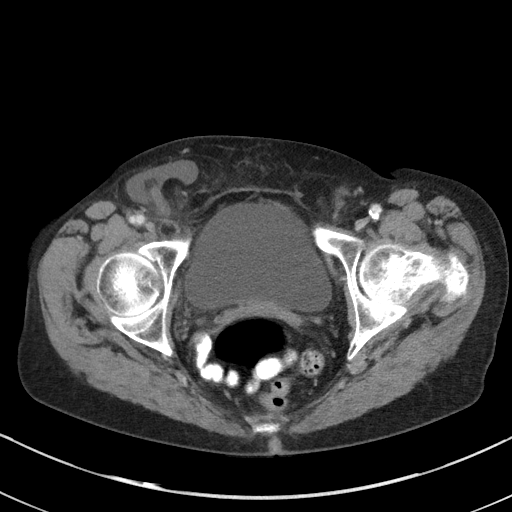
[im 26/92  soft-tissue]
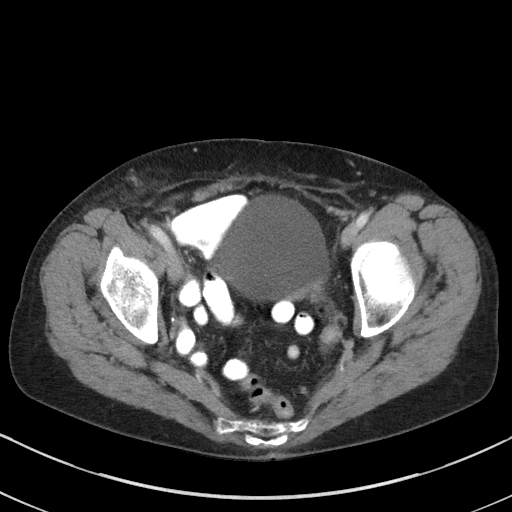
[im 36/92  soft-tissue]
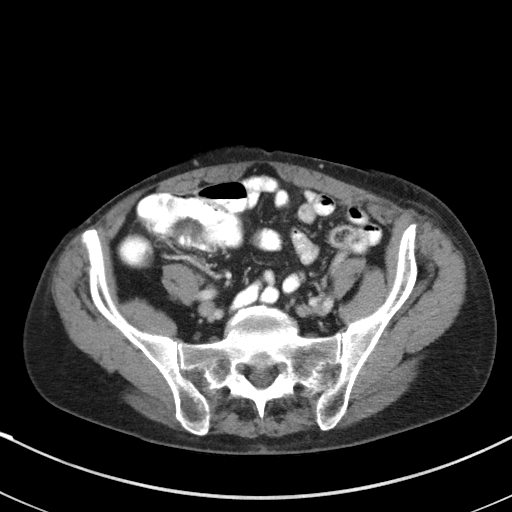
[im 41/92  soft-tissue]
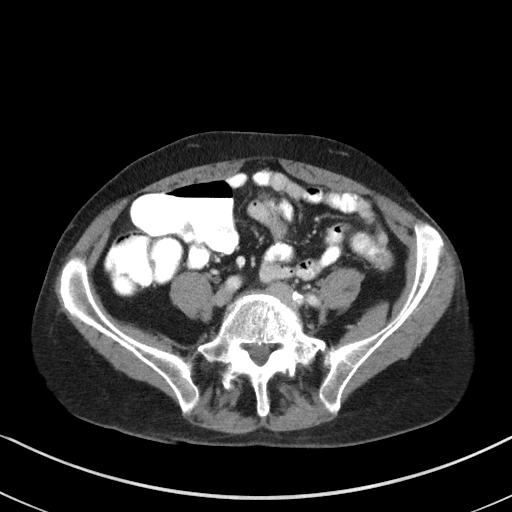
[im 51/92  soft-tissue]
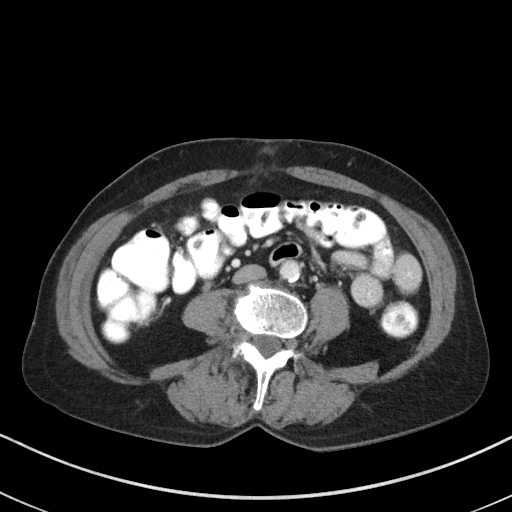
[im 56/92  soft-tissue]
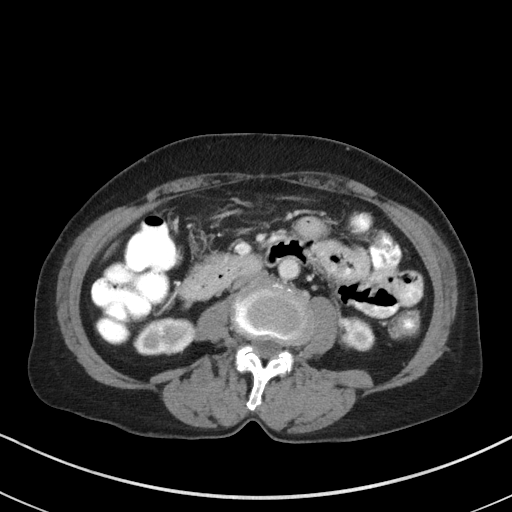
[im 66/92  soft-tissue]
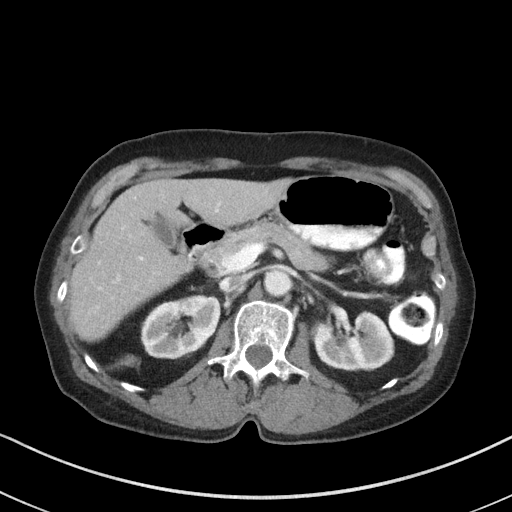
[im 66/92  bone]
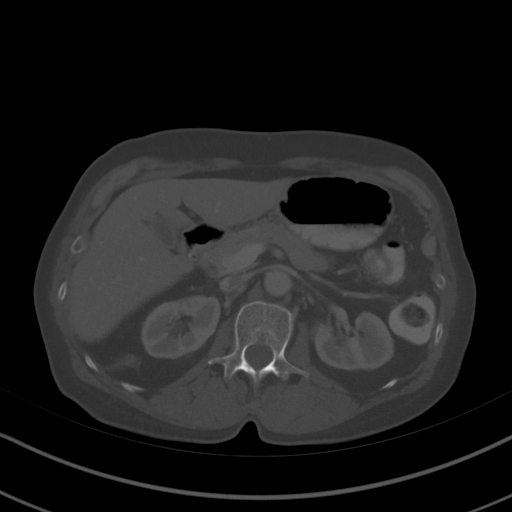
[im 71/92  soft-tissue]
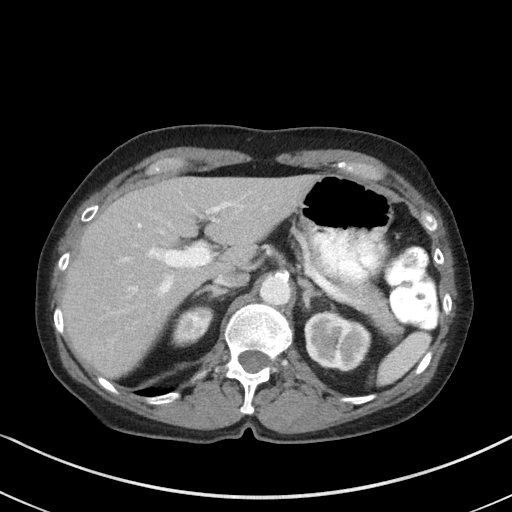
[im 71/92  lung]
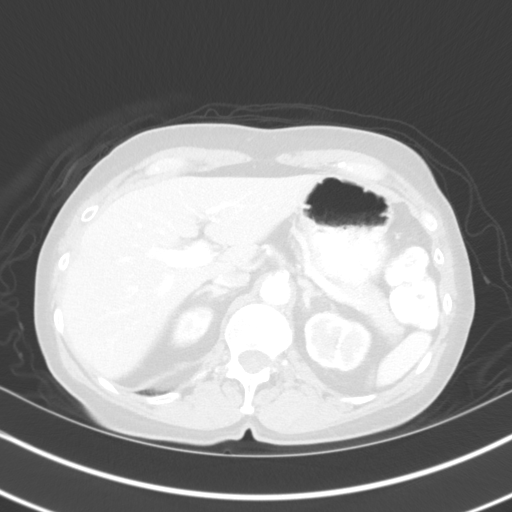
[im 76/92  soft-tissue]
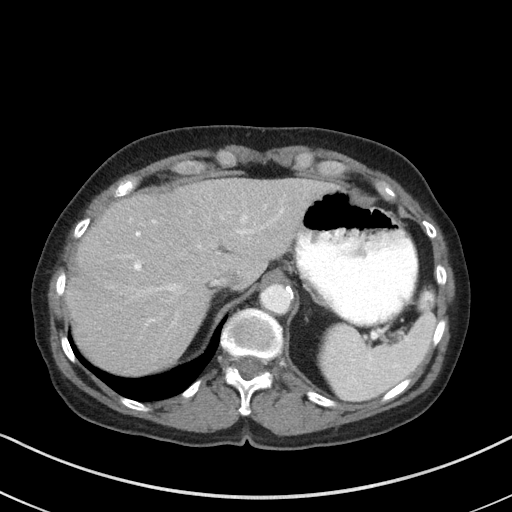
[im 76/92  lung]
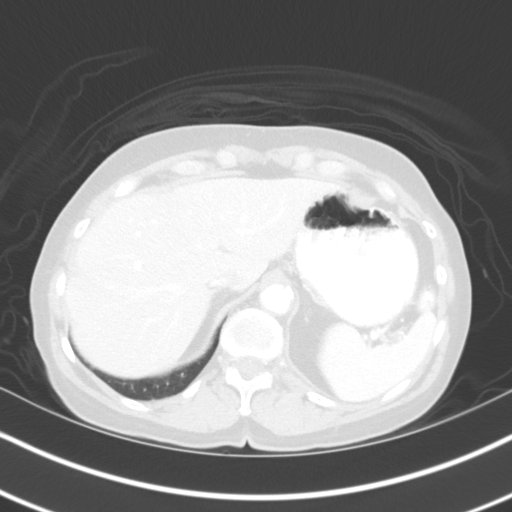
[im 81/92  lung]
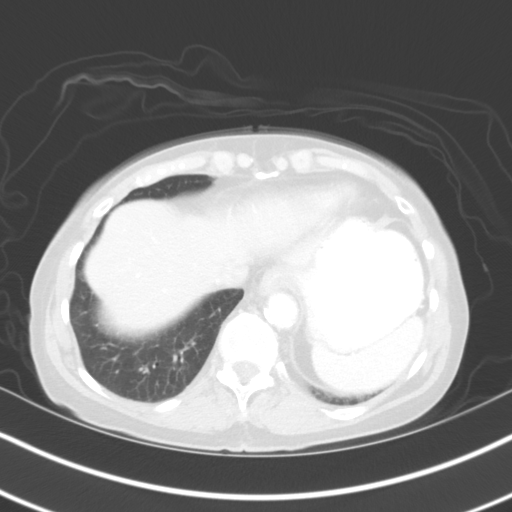
[im 86/92  soft-tissue]
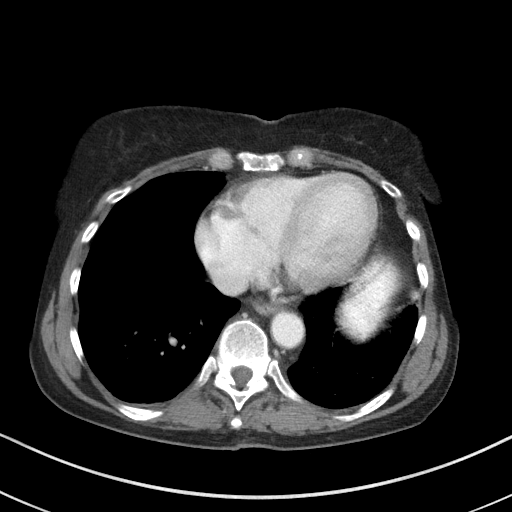
[im 86/92  lung]
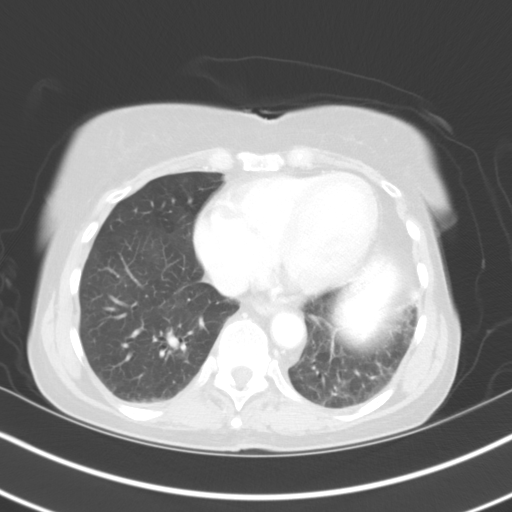

[13 of 32 positions shown; findings below may reference images not displayed]

FINDINGS: Lower chest:  No acute findings.

Hepatobiliary: No masses or other significant abnormality.
Gallbladder is unremarkable.

Pancreas: No mass, inflammatory changes, or other significant
abnormality.

Spleen: Within normal limits in size and appearance.

Adrenals/Urinary Tract: No masses identified. Tiny right upper pole
renal cyst noted. No evidence of hydronephrosis. Unopacified urinary
bladder is unremarkable in appearance.

Stomach/Bowel: No evidence of obstruction, inflammatory process, or
abnormal fluid collections.

Vascular/Lymphatic: No pathologically enlarged lymph nodes. No
evidence of abdominal aortic aneurysm. Aortic atherosclerosis noted.

Reproductive: Prior hysterectomy noted. Adnexal regions are
unremarkable in appearance. Previously seen cystic right adnexal
mass is no longer visualized. No evidence free pelvic fluid.

Other: A small left inguinal hernia containing only fat is stable. A
fat containing right inguinal hernia has increased in size, and now
contains a fluid collection measuring approximately 3 x 5 cm.
Increased soft tissue stranding is seen in adjacent fat. No evidence
herniated bowel loops.

Musculoskeletal:  No suspicious bone lesions identified.
IMPRESSION: Increased size of fat containing right inguinal hernia, which now
contains a fluid collection measuring approximately 3 x 5 cm and
increased adjacent soft tissue stranding. Although there is no
herniated bowel loops, incarceration or ischemia of herniated fat
cannot be excluded. Clinical correlation is recommended.

No other postop fluid collections or complications identified.

Aortic atherosclerosis noted.

These results will be called to the ordering clinician or
representative by the Radiologist Assistant, and communication
documented in the PACS or zVision Dashboard.

## 2018-06-27 ENCOUNTER — Other Ambulatory Visit: Payer: Self-pay | Admitting: Internal Medicine

## 2018-06-27 DIAGNOSIS — Z1231 Encounter for screening mammogram for malignant neoplasm of breast: Secondary | ICD-10-CM

## 2018-07-16 ENCOUNTER — Ambulatory Visit
Admission: RE | Admit: 2018-07-16 | Discharge: 2018-07-16 | Disposition: A | Discharge status: Home / Self Care | Payer: Medicare Other | Source: Ambulatory Visit | Attending: Internal Medicine | Admitting: Internal Medicine

## 2018-07-16 ENCOUNTER — Other Ambulatory Visit: Payer: Self-pay

## 2018-07-16 DIAGNOSIS — Z1231 Encounter for screening mammogram for malignant neoplasm of breast: Secondary | ICD-10-CM | POA: Insufficient documentation

## 2019-08-22 ENCOUNTER — Other Ambulatory Visit: Payer: Self-pay | Admitting: Internal Medicine

## 2019-08-22 DIAGNOSIS — Z1231 Encounter for screening mammogram for malignant neoplasm of breast: Secondary | ICD-10-CM

## 2019-09-09 ENCOUNTER — Other Ambulatory Visit: Payer: Self-pay

## 2019-09-09 ENCOUNTER — Ambulatory Visit
Admission: RE | Admit: 2019-09-09 | Discharge: 2019-09-09 | Disposition: A | Discharge status: Home / Self Care | Payer: Medicare PPO | Source: Ambulatory Visit | Attending: Internal Medicine | Admitting: Internal Medicine

## 2019-09-09 DIAGNOSIS — Z1231 Encounter for screening mammogram for malignant neoplasm of breast: Secondary | ICD-10-CM | POA: Diagnosis present

## 2020-08-05 ENCOUNTER — Other Ambulatory Visit: Payer: Self-pay | Admitting: Internal Medicine

## 2020-08-05 DIAGNOSIS — Z1231 Encounter for screening mammogram for malignant neoplasm of breast: Secondary | ICD-10-CM

## 2020-09-15 ENCOUNTER — Other Ambulatory Visit: Payer: Self-pay

## 2020-09-15 ENCOUNTER — Ambulatory Visit
Admission: RE | Admit: 2020-09-15 | Discharge: 2020-09-15 | Disposition: A | Discharge status: Home / Self Care | Payer: Medicare PPO | Source: Ambulatory Visit | Attending: Internal Medicine | Admitting: Internal Medicine

## 2020-09-15 DIAGNOSIS — Z1231 Encounter for screening mammogram for malignant neoplasm of breast: Secondary | ICD-10-CM | POA: Insufficient documentation

## 2020-10-10 ENCOUNTER — Ambulatory Visit
Admission: EM | Admit: 2020-10-10 | Discharge: 2020-10-10 | Disposition: A | Discharge status: Home / Self Care | Payer: Medicare PPO

## 2020-10-10 ENCOUNTER — Other Ambulatory Visit: Payer: Self-pay

## 2020-10-10 DIAGNOSIS — B349 Viral infection, unspecified: Secondary | ICD-10-CM | POA: Diagnosis not present

## 2020-10-10 MED ORDER — BENZONATATE 100 MG PO CAPS: 100.0000 mg | ORAL_CAPSULE | Freq: Three times a day (TID) | ORAL | 0 refills | Status: DC

## 2020-10-10 MED ORDER — ALBUTEROL SULFATE HFA 108 (90 BASE) MCG/ACT IN AERS: 1.0000 | INHALATION_SPRAY | Freq: Four times a day (QID) | RESPIRATORY_TRACT | 0 refills | Status: DC | PRN

## 2020-10-10 NOTE — Discharge Instructions (Signed)
Use tessalon perles and proair as prescribed  For most people this is a self-limiting process and can take anywhere from 7 - 10 days to start feeling better. A cough can last up to 3 weeks. Pay special attention to handwashing as this can help prevent the spread of the virus.   Always read the labels of cough and cold medications as they may contain some of the ingredients below.  Rest, push lots of fluids (especially water), and utilize supportive care for symptoms. You may take acetaminophen (Tylenol) every 4-6 hours and ibuprofen every 6-8 hours for muscle pain, joint pain, headaches (you may also alternate these medications). Mucinex (guaifenesin) may be taken over the counter for cough as needed can loosen phlegm. Please read the instructions and take as directed.  Sudafed (pseudophedrine) is sold behind the counter and can help reduce nasal pressure; avoid taking this if you have high blood pressure or feel jittery. Sudafed PE (phenylephrine) can be a helpful, short-term, over-the-counter alternative to limit side effects or if you have high blood pressure.  Flonase nasal spray can help alleviate congestion and sinus pressure. Many patients choose Afrin as a nasal decongestant; do not use for more than 3 days for risk of rebound (increased symptoms after stopping medication).  Saline nasal sprays or rinses can also help nasal congestion (use bottled or sterile water). Warm tea with lemon and honey can sooth sore throat and cough, as can cough drops.   Return to clinic for high fever not improving with medications, chest pain, difficulty breathing, non-stop vomiting, or coughing blood. Follow-up with your primary care provider if symptoms do not improve as expected in the next 5-7 days.

## 2020-10-10 NOTE — ED Triage Notes (Addendum)
Patient presents to Urgent Care with complaints of sore throat, cough, and right ear pressure x 3 days ago. Treating symptoms with her prescribe allergy meds. Pt has a hx of seasonal allergies.  Denies fever or SOB.

## 2020-10-10 NOTE — ED Provider Notes (Signed)
CHIEF COMPLAINT:   Chief Complaint  Patient presents with   Sore Throat   Cough     SUBJECTIVE/HPI:  HPI A very pleasant 80 y.o.Female presents today with sore throat, cough and right ear pressure that started 3 days ago.  Patient has been treating symptoms with allergy medications, but states that this does not seem to help very much.  No fever or shortness of breath.  No known COVID exposure. Patient does not report any shortness of breath, chest pain, palpitations, visual changes, weakness, tingling, headache, nausea, vomiting, diarrhea, fever, chills.   has a past medical history of Allergic rhinitis, Cervical disc disease, Chronic kidney disease, GERD (gastroesophageal reflux disease), H/O blood clots, History of colon polyps, Hyperlipidemia, Hypertension, PONV (postoperative nausea and vomiting), and Shingles.  ROS:  Review of Systems See Subjective/HPI Medications, Allergies and Problem List personally reviewed in Epic today OBJECTIVE:   Vitals:   10/10/20 0955  BP: (!) 184/81  Pulse: 78  Resp: 16  Temp: 98.1 F (36.7 C)  SpO2: 95%    Physical Exam   General: Appears well-developed and well-nourished. No acute distress.  HEENT Head: Normocephalic and atraumatic.  No frontal, maxillary sinus and nasal bridge tenderness noted to palpation. Ears: Hearing grossly intact, no drainage or visible deformity.  Nose: No nasal deviation.  Erythematous and congested nasal mucosa noted bilaterally with rhinorrhea Mouth/Throat: No stridor or tracheal deviation.  Non erythematous posterior pharynx noted with clear drainage present.  No white patchy exudate noted. Eyes: Conjunctivae and EOM are normal. No eye drainage or scleral icterus bilaterally.  Neck: Normal range of motion, neck is supple. No cervical, tonsillar or submandibular lymph nodes palpated.   Cardiovascular: Normal rate. Regular rhythm; no murmurs, gallops, or rubs.  Pulm/Chest: No respiratory distress. Breath sounds  normal bilaterally without wheezes, rhonchi, or rales.  Neurological: Alert and oriented to person, place, and time.  Skin: Skin is warm and dry.  No rashes, lesions, abrasions or bruising noted to skin.   Psychiatric: Normal mood, affect, behavior, and thought content.   Vital signs and nursing note reviewed.   Patient stable and cooperative with examination. PROCEDURES:    LABS/X-RAYS/EKG/MEDS:   No results found for any visits on 10/10/20.  MEDICAL DECISION MAKING:   Patient presents with sore throat, cough and right ear pressure that started 3 days ago.  Patient has been treating symptoms with allergy medications, but states that this does not seem to help very much.  No fever or shortness of breath.  No known COVID exposure. Patient does not report any shortness of breath, chest pain, palpitations, visual changes, weakness, tingling, headache, nausea, vomiting, diarrhea, fever, chills.  Chart review completed.  Given symptoms along with assessment findings, likely viral illness.  COVID-19 PCR obtained in clinic today, advised that we will call with any positive results.  Rx'd [employer] and a Ventolin inhaler to the patient's preferred pharmacy.  Advised fluids, Tylenol versus ibuprofen, Mucinex, Sudafed.  Return to clinic with any high fever not improving with medications, chest pain, difficulty breathing, nonstop vomiting or coughing up blood.  Patient verbalized understanding and agreed with treatment plan.  Patient stable upon discharge. ASSESSMENT/PLAN:  1. Viral illness - Novel Coronavirus, NAA ([facility]); Standing - Novel Coronavirus, NAA ([facility]) - albuterol (VENTOLIN HFA) 108 (90 Base) MCG/ACT inhaler; Inhale 1-2 puffs into the lungs every 6 (six) hours as needed (cough).  Dispense: 6.7 g; Refill: 0 - benzonatate (TESSALON) 100 MG capsule; Take 1 capsule (100 mg total) by  mouth every 8 (eight) hours.  Dispense: 21 capsule; Refill: 0 Instructions about new medications and  side effects provided.  Plan:   Discharge Instructions      Use tessalon perles and proair as prescribed  For most people this is a self-limiting process and can take anywhere from 7 - 10 days to start feeling better. A cough can last up to 3 weeks. Pay special attention to handwashing as this can help prevent the spread of the virus.   Always read the labels of cough and cold medications as they may contain some of the ingredients below.  Rest, push lots of fluids (especially water), and utilize supportive care for symptoms. You may take acetaminophen (Tylenol) every 4-6 hours and ibuprofen every 6-8 hours for muscle pain, joint pain, headaches (you may also alternate these medications). Mucinex (guaifenesin) may be taken over the counter for cough as needed can loosen phlegm. Please read the instructions and take as directed.  Sudafed (pseudophedrine) is sold behind the counter and can help reduce nasal pressure; avoid taking this if you have high blood pressure or feel jittery. Sudafed PE (phenylephrine) can be a helpful, short-term, over-the-counter alternative to limit side effects or if you have high blood pressure.  Flonase nasal spray can help alleviate congestion and sinus pressure. Many patients choose Afrin as a nasal decongestant; do not use for more than 3 days for risk of rebound (increased symptoms after stopping medication).  Saline nasal sprays or rinses can also help nasal congestion (use bottled or sterile water). Warm tea with lemon and honey can sooth sore throat and cough, as can cough drops.   Return to clinic for high fever not improving with medications, chest pain, difficulty breathing, non-stop vomiting, or coughing blood. Follow-up with your primary care provider if symptoms do not improve as expected in the next 5-7 days.          Jane Griffin, [place] 10/10/20 1134

## 2020-10-12 LAB — NOVEL CORONAVIRUS, NAA: SARS-CoV-2, NAA: NOT DETECTED

## 2020-10-12 LAB — SARS-COV-2, NAA 2 DAY TAT

## 2021-03-02 DIAGNOSIS — R7303 Prediabetes: Secondary | ICD-10-CM | POA: Insufficient documentation

## 2021-03-02 DIAGNOSIS — E538 Deficiency of other specified B group vitamins: Secondary | ICD-10-CM | POA: Insufficient documentation

## 2021-08-10 ENCOUNTER — Other Ambulatory Visit: Payer: Self-pay | Admitting: Internal Medicine

## 2021-08-10 DIAGNOSIS — Z1231 Encounter for screening mammogram for malignant neoplasm of breast: Secondary | ICD-10-CM

## 2021-09-09 ENCOUNTER — Telehealth: Payer: Self-pay

## 2021-09-09 NOTE — Telephone Encounter (Signed)
Patient is due for their 5 year follow up colonoscopy. Last one done 10/11/16 by Dr Jamal Collin. Left message to see if they would like a referral to [facility] to have this done, or if they would like to see Dr Bary Castilla who is at [facility].

## 2021-09-19 ENCOUNTER — Ambulatory Visit
Admission: RE | Admit: 2021-09-19 | Discharge: 2021-09-19 | Disposition: A | Discharge status: Home / Self Care | Payer: Medicare PPO | Source: Ambulatory Visit | Attending: Internal Medicine | Admitting: Internal Medicine

## 2021-09-19 DIAGNOSIS — Z1231 Encounter for screening mammogram for malignant neoplasm of breast: Secondary | ICD-10-CM | POA: Insufficient documentation

## 2021-09-22 ENCOUNTER — Other Ambulatory Visit: Payer: Self-pay | Admitting: Internal Medicine

## 2021-09-22 DIAGNOSIS — R928 Other abnormal and inconclusive findings on diagnostic imaging of breast: Secondary | ICD-10-CM

## 2021-10-05 ENCOUNTER — Ambulatory Visit
Admission: RE | Admit: 2021-10-05 | Discharge: 2021-10-05 | Disposition: A | Discharge status: Home / Self Care | Payer: Medicare PPO | Source: Ambulatory Visit | Attending: Internal Medicine | Admitting: Internal Medicine

## 2021-10-05 DIAGNOSIS — R928 Other abnormal and inconclusive findings on diagnostic imaging of breast: Secondary | ICD-10-CM | POA: Insufficient documentation

## 2021-10-07 ENCOUNTER — Other Ambulatory Visit: Payer: Self-pay | Admitting: Internal Medicine

## 2021-10-07 DIAGNOSIS — N63 Unspecified lump in unspecified breast: Secondary | ICD-10-CM

## 2021-10-07 DIAGNOSIS — R928 Other abnormal and inconclusive findings on diagnostic imaging of breast: Secondary | ICD-10-CM

## 2021-10-26 ENCOUNTER — Ambulatory Visit
Admission: RE | Admit: 2021-10-26 | Discharge: 2021-10-26 | Disposition: A | Discharge status: Home / Self Care | Payer: Medicare PPO | Source: Ambulatory Visit | Attending: Internal Medicine | Admitting: Internal Medicine

## 2021-10-26 DIAGNOSIS — N63 Unspecified lump in unspecified breast: Secondary | ICD-10-CM | POA: Insufficient documentation

## 2021-10-26 DIAGNOSIS — R928 Other abnormal and inconclusive findings on diagnostic imaging of breast: Secondary | ICD-10-CM | POA: Insufficient documentation

## 2021-10-26 HISTORY — PX: BREAST BIOPSY: SHX20

## 2021-10-27 ENCOUNTER — Encounter: Payer: Self-pay | Admitting: *Deleted

## 2021-10-27 DIAGNOSIS — C50919 Malignant neoplasm of unspecified site of unspecified female breast: Secondary | ICD-10-CM

## 2021-10-27 LAB — SURGICAL PATHOLOGY

## 2021-10-27 NOTE — Progress Notes (Signed)
Received referral for newly diagnosed breast cancer from [facility].  Navigation initiated.  Med Onc and Surgical consults scheduled. She will see Dr. Grayland Ormond on 9/27 at 11:15 and Dr. Bary Castilla on Monday 9/25 at 59.

## 2021-10-28 DIAGNOSIS — C50912 Malignant neoplasm of unspecified site of left female breast: Secondary | ICD-10-CM | POA: Insufficient documentation

## 2021-10-28 NOTE — Progress Notes (Signed)
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  INTERVAL HISTORY: Patient is a 81 year old female who underwent screening mammogram followed by ultrasound and biopsy revealing the above-stated malignancy.  She currently feels well and is asymptomatic.  She has no neurologic complaints.  She denies any recent fevers or illnesses.  She has a good appetite and denies weight loss.  She has no chest pain, shortness of breath, cough, or hemoptysis.  She denies any nausea, vomiting, constipation, or diarrhea.  She has no urinary complaints.  Patient has baseline offers no specific complaints today.  REVIEW OF SYSTEMS:   Review of Systems  Constitutional: Negative.  Negative for fever, malaise/fatigue and weight loss.  Respiratory: Negative.  Negative for cough and shortness of breath.   Cardiovascular: Negative.  Negative for chest pain and leg swelling.  Gastrointestinal: Negative.  Negative for abdominal pain.  Genitourinary: Negative.  Negative for dysuria.  Musculoskeletal: Negative.  Negative for back pain.  Skin: Negative.  Negative for rash.  Neurological: Negative.  Negative for dizziness, focal weakness, weakness and headaches.  Psychiatric/Behavioral: Negative.  The patient is not nervous/anxious.     As per HPI. Otherwise, a complete review of systems is negative.  PAST MEDICAL HISTORY: Past Medical History:  Diagnosis Date   Allergic rhinitis    Cervical disc disease    Chronic kidney disease    STAGE 3   GERD (gastroesophageal reflux  disease)    H/O blood clots    around her 46's   History of colon polyps    Hyperlipidemia    Hypertension    PONV (postoperative nausea and vomiting)    nausea   Shingles     PAST SURGICAL HISTORY: Past Surgical History:  Procedure Laterality Date   ABDOMINAL HYSTERECTOMY     BREAST BIOPSY Left 10/26/2021   [place] Core Bx ribbon clip path pending   COLONOSCOPY  2013   COLONOSCOPY WITH PROPOFOL N/A 10/11/2016   Procedure: COLONOSCOPY WITH PROPOFOL;  Surgeon: Christene Lye, MD;  Location: [facility] END[facility];  Service: Endoscopy;  Laterality: N/A;   ELBOW SURGERY Left    FEMORAL HERNIA REPAIR Right 09/18/2015   Procedure: HERNIA REPAIR femoral  ADULT;  Surgeon: Christene Lye, MD;  Location: [facility] ORS;  Service: General;  Laterality: Right;   HERNIA REPAIR     LAPAROSCOPIC BILATERAL SALPINGO OOPHERECTOMY Bilateral 09/06/2015   Procedure: LAPAROSCOPIC BILATERAL SALPINGO OOPHORECTOMY;  Surgeon: Boykin Nearing, MD;  Location: [facility] ORS;  Service: Gynecology;  Laterality: Bilateral;   PARTIAL HYSTERECTOMY     TUBAL LIGATION     UPPER GI ENDOSCOPY     WRIST SURGERY Right     FAMILY HISTORY: Family History  Problem Relation Age of Onset   Clotting disorder Mother    Heart failure Mother    Emphysema Father    Heart failure Father    Cancer Daughter        breast cancer   Breast cancer Daughter 58    ADVANCED DIRECTIVES (Y/N):  N  HEALTH MAINTENANCE: Social History   Tobacco Use   Smoking status: Never  Smokeless tobacco: Never  Substance Use Topics   Alcohol use: No   Drug use: No     Colonoscopy:  PAP:  Bone density:  Lipid panel:  Allergies  Allergen Reactions   Codeine Nausea Only   Gabapentin     nightmares   Lipitor [Atorvastatin]     Aches    Pravachol [Pravastatin Sodium]     aches   Tramadol     nausea   Aleve [Naproxen Sodium] Rash   Sulfa Antibiotics Itching and Rash   Sulfasalazine Itching and Rash    Current Outpatient  Medications  Medication Sig Dispense Refill   fluvastatin XL (LESCOL XL) 80 MG 24 hr tablet Take 80 mg by mouth daily.  2   olmesartan-hydrochlorothiazide (BENICAR HCT) 40-12.5 MG tablet Take 1 tablet by mouth daily.     pantoprazole (PROTONIX) 40 MG tablet TAKE 1 TABLET BY MOUTH ONCE A DAY 30-60 MINUTES BEFORE FIRST MEAL OF THE DAY 90 tablet 1   No current facility-administered medications for this visit.    OBJECTIVE: Vitals:   11/02/21 1131  BP: 137/76  Pulse: 69  Resp: 16  Temp: 97.6 F (36.4 C)  SpO2: 98%     Body mass index is 21.4 kg/m.    ECOG FS:0 - Asymptomatic  General: Well-developed, well-nourished, no acute distress. Eyes: Pink conjunctiva, anicteric sclera. HEENT: Normocephalic, moist mucous membranes. Breast: No palpable masses.  Exam recently performed by another provider. Lungs: No audible wheezing or coughing. Heart: Regular rate and rhythm. Abdomen: Soft, nontender, no obvious distention. Musculoskeletal: No edema, cyanosis, or clubbing. Neuro: Alert, answering all questions appropriately. Cranial nerves grossly intact. Skin: No rashes or petechiae noted. Psych: Normal affect. Lymphatics: No cervical, calvicular, axillary or inguinal LAD.   LAB RESULTS:  Lab Results  Component Value Date   NA 137 09/18/2015   K 3.9 09/18/2015   CL 102 09/18/2015   CO2 25 09/18/2015   GLUCOSE 94 09/18/2015   BUN 19 09/18/2015   CREATININE 1.26 (H) 09/18/2015   CALCIUM 9.3 09/18/2015   PROT 7.9 09/18/2015   ALBUMIN 4.0 09/18/2015   AST 19 09/18/2015   ALT 12 (L) 09/18/2015   ALKPHOS 80 09/18/2015   BILITOT 1.1 09/18/2015   GFRNONAA 41 (L) 09/18/2015   GFRAA 48 (L) 09/18/2015    Lab Results  Component Value Date   WBC 11.7 (H) 09/18/2015   NEUTROABS 6.1 10/10/2012   HGB 12.5 09/18/2015   HCT 36.5 09/18/2015   MCV 86.5 09/18/2015   PLT 255 09/18/2015     STUDIES: MR BREAST BILATERAL W WO CONTRAST INC CAD  Result Date: 11/04/2021 CLINICAL DATA:   Staging exam. Newly diagnosed left breast invasive mammary carcinoma with lobular features. EXAM: BILATERAL BREAST MRI WITH AND WITHOUT CONTRAST TECHNIQUE: Multiplanar, multisequence MR images of both breasts were obtained prior to and following the intravenous administration of 5 ml of Vueway Three-dimensional MR images were rendered by post-processing of the original MR data on an independent workstation. The three-dimensional MR images were interpreted, and findings are reported in the following complete MRI report for this study. Three dimensional images were evaluated at the independent interpreting workstation using the DynaCAD thin client. COMPARISON:  None available. FINDINGS: Breast composition: c. Heterogeneous fibroglandular tissue. Background parenchymal enhancement: Mild Right breast: No mass or abnormal enhancement. Left breast: There is susceptibility artifact at the site of recent biopsy in the upper outer far posterior left breast. At the biopsy site there is a small irregular enhancing mass  measuring 0.5 x 0.4 x 0.4 cm (series 10, image 60). No additional suspicious mass or non mass enhancement in the left breast. Lymph nodes: No abnormal appearing lymph nodes. Ancillary findings:  None. IMPRESSION: 1. Biopsy-proven malignancy in the upper outer far posterior left breast measuring 0.5 cm. 2. No additional suspicious mass or non mass enhancement in the left breast. 3. No MRI evidence of malignancy in the right breast. 4. No axillary adenopathy. RECOMMENDATION: Treatment plan for known left breast cancer. BI-RADS CATEGORY  6: Known biopsy-proven malignancy. Electronically Signed   By: Audie Pinto M.D.   On: 11/04/2021 09:31  [place] LT BREAST BX W LOC DEV 1ST LESION IMG BX SPEC US GUIDE  Addendum Date: 10/28/2021   ADDENDUM REPORT: 10/28/2021 12:59 ADDENDUM: Pathology revealed BREAST, LEFT AT 1:30 O'CLOCK, 10 CM FROM NIPPLE; RIBBON CLIP; ULTRASOUND-GUIDED CORE NEEDLE BIOPSY: GRADE 2 INVASIVE  MAMMARY CARCINOMA, WITH PLEOMORPHIC LOBULAR FEATURES. This was found to be concordant by Dr. Beryle Flock. Pathology results were discussed with the patient by telephone. The patient reported doing well after the biopsy with tenderness at the site. Post biopsy instructions and care were reviewed and questions were answered. The patient was encouraged to call [facility] for any additional concerns. A surgical and medical oncologist referral will be arranged by Casper Harrison RN, Oncology Nurse Navigator of [facility]. Pathology results reported by Stacie Acres RN on 10/28/2021. Electronically Signed   By: Beryle Flock M.D.   On: 10/28/2021 12:59   Result Date: 10/28/2021 CLINICAL DATA:  81 year old female presenting for ultrasound-guided biopsy of left breast mass EXAM: ULTRASOUND GUIDED LEFT BREAST CORE NEEDLE BIOPSY COMPARISON:  Previous exam(s). PROCEDURE: I met with the patient and we discussed the procedure of ultrasound-guided biopsy, including benefits and alternatives. We discussed the high likelihood of a successful procedure. We discussed the risks of the procedure, including infection, bleeding, tissue injury, clip migration, and inadequate sampling. Informed written consent was given. The usual time-out protocol was performed immediately prior to the procedure. Lesion quadrant: Upper outer quadrant Using sterile technique and 1% Lidocaine as local anesthetic, under direct ultrasound visualization, a 14 gauge spring-loaded device was used to perform biopsy of a left breast mass in the 1:30 position using an inferior approach. At the conclusion of the procedure ribbon shaped tissue marker clip was deployed into the biopsy cavity. Follow up 2 view mammogram was performed and dictated separately. IMPRESSION: Ultrasound guided biopsy of the left breast mass in the 1:30 position. No apparent complications.  Electronically Signed: By: Beryle Flock M.D. On: 10/26/2021 08:51   MM CLIP PLACEMENT LEFT  Result Date: 10/26/2021 CLINICAL DATA:  Postprocedure mammogram EXAM: 3D DIAGNOSTIC LEFT MAMMOGRAM POST ULTRASOUND BIOPSY COMPARISON:  Previous exam(s). FINDINGS: 3D Mammographic images were obtained following ultrasound guided biopsy of a left breast mass in the 1:30 position. The biopsy marking clip is in expected position at the site of biopsy. IMPRESSION: Appropriate positioning of the ribbon shaped biopsy marking clip at the site of biopsy in the left breast 1:30 position. Final Assessment: Post Procedure Mammograms for Marker Placement Electronically Signed   By: Beryle Flock M.D.   On: 10/26/2021 08:56  MM DIAG BREAST TOMO UNI LEFT  Result Date: 10/05/2021 CLINICAL DATA:  81 year old female recalled from screening mammogram dated 09/19/2021 for possible left breast distortion. EXAM: DIGITAL DIAGNOSTIC UNILATERAL LEFT MAMMOGRAM WITH TOMOSYNTHESIS; ULTRASOUND LEFT BREAST LIMITED TECHNIQUE: Left digital diagnostic mammography  and breast tomosynthesis was performed.; Targeted ultrasound examination of the left breast was performed. COMPARISON:  Previous exam(s). ACR Breast Density Category c: The breast tissue is heterogeneously dense, which may obscure small masses. FINDINGS: There is a persistent spiculated mass with associated distortion in the far posterior upper left breast. It localizes laterally on tomosynthesis. Further evaluation with ultrasound was performed. Targeted ultrasound is performed, showing an irregular, hypoechoic mass with associated vascularity and posterior shadowing at the 1:30 position 10 cm from the nipple. It measures 4 x 4 x 4 mm. This corresponds with the mammographic finding. Evaluation of the left axilla demonstrates no suspicious lymphadenopathy. IMPRESSION: 1. Suspicious left breast mass corresponding with the screening mammographic findings. Recommend ultrasound-guided  biopsy. 2. No suspicious left axillary lymphadenopathy. RECOMMENDATION: Ultrasound-guided biopsy of the left breast. I have discussed the findings and recommendations with the patient. If applicable, a reminder letter will be sent to the patient regarding the next appointment. BI-RADS CATEGORY  4: Suspicious. Electronically Signed   By: Kristopher Oppenheim M.D.   On: 10/05/2021 13:55  US BREAST LTD UNI LEFT INC AXILLA  Result Date: 10/05/2021 CLINICAL DATA:  80 year old female recalled from screening mammogram dated 09/19/2021 for possible left breast distortion. EXAM: DIGITAL DIAGNOSTIC UNILATERAL LEFT MAMMOGRAM WITH TOMOSYNTHESIS; ULTRASOUND LEFT BREAST LIMITED TECHNIQUE: Left digital diagnostic mammography and breast tomosynthesis was performed.; Targeted ultrasound examination of the left breast was performed. COMPARISON:  Previous exam(s). ACR Breast Density Category c: The breast tissue is heterogeneously dense, which may obscure small masses. FINDINGS: There is a persistent spiculated mass with associated distortion in the far posterior upper left breast. It localizes laterally on tomosynthesis. Further evaluation with ultrasound was performed. Targeted ultrasound is performed, showing an irregular, hypoechoic mass with associated vascularity and posterior shadowing at the 1:30 position 10 cm from the nipple. It measures 4 x 4 x 4 mm. This corresponds with the mammographic finding. Evaluation of the left axilla demonstrates no suspicious lymphadenopathy. IMPRESSION: 1. Suspicious left breast mass corresponding with the screening mammographic findings. Recommend ultrasound-guided biopsy. 2. No suspicious left axillary lymphadenopathy. RECOMMENDATION: Ultrasound-guided biopsy of the left breast. I have discussed the findings and recommendations with the patient. If applicable, a reminder letter will be sent to the patient regarding the next appointment. BI-RADS CATEGORY  4: Suspicious. Electronically Signed    By: Kristopher Oppenheim M.D.   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  MRI of the breast completed on November 04, 2021 did not reveal any additional malignancy.  Given the small size of the mass, she is considered a T1 a therefore Oncotype testing is not necessary.  Patient has an appointment with surgery next week.  If she undergoes lumpectomy, she will require adjuvant XRT.  At the conclusion of her radiation treatments she will require letrozole for total of 5 years.  Return to clinic 2 weeks after her surgery for further evaluation as well as consultation with radiation oncology.  I spent a total of 60 minutes reviewing chart data, face-to-face evaluation with the patient, counseling and coordination of care as detailed above.   Patient expressed understanding and was in agreement with this plan. She also understands that She can call clinic at any time with any questions, concerns,  or complaints.    Cancer Staging  Invasive ductal carcinoma of left breast [facility]) Staging form: Breast, AJCC 8th Edition - Clinical stage from 11/04/2021: Stage IA (cT1a, cN0, cM0, G2, ER+, PR+, HER2-) - Signed by Lloyd Huger, MD on 11/04/2021 Stage prefix: Initial diagnosis Histologic grading system: 3 grade system  Lloyd Huger, MD   11/04/2021 9:52 AM

## 2021-10-31 ENCOUNTER — Other Ambulatory Visit: Payer: Self-pay | Admitting: General Surgery

## 2021-10-31 DIAGNOSIS — C50912 Malignant neoplasm of unspecified site of left female breast: Secondary | ICD-10-CM

## 2021-11-01 ENCOUNTER — Other Ambulatory Visit: Payer: Self-pay | Admitting: General Surgery

## 2021-11-01 NOTE — Progress Notes (Signed)
Progress Notes - documented in this encounter Robbert Langlinais, Geronimo Boot, MD - 10/31/2021 11:00 AM EDT Formatting of this note is different from the original. Subjective:   Patient ID: Jane Griffin is a 81 y.o. female.  HPI  The following portions of the patient's history were reviewed and updated as appropriate.  This an established patient is here today for: office visit. The patient has been referred today by Dr. Ouida Sills for evaluation of a newly diagnosed left breast cancer. She had an ultrasound guided left breast core biopsy done on 10-26-21. Patient reports she had not noticed any breast changes prior to her recent mammogram and that she had been getting annual exams.   The patient reports her bra size is 36 B.  Patient has a daughter that was diagnosed with breast cancer at age 63.   Chief Complaint  Patient presents with  Treatment Plan Discussion    BP 128/66  Pulse 85  Temp 36.9 C (98.4 F)  Ht 154.9 cm (5' 1" )  Wt 52.6 kg (116 lb)  SpO2 97%  BMI 21.92 kg/m   Past Medical History:  Diagnosis Date  Allergic rhinitis  Asthma 02/07/2008  in records and suggested by pulmonary consult 2010  Cervical disc disease  Chronic back pain  Chronic cough  Chronic kidney disease  GERD (gastroesophageal reflux disease)  History of blood clots  in her 20's  History of colon polyps  HTN (hypertension)  Hyperlipidemia  Phlebitis  PONV (postoperative nausea and vomiting)  Shingles    Past Surgical History:  Procedure Laterality Date  HYSTERECTOMY 1983  for dysmenorrhea. Ovaries remain  posterior colporrhaphy with enterocele ligation 2007  UPPER GASTROINTESTINAL ENDOSCOPY 2010  normal  Laparoscopic BSO and LOA 08/19/2015  Repair of incarcerated right femoral hernia 09/18/2015  Dr Jamal Collin  COLONOSCOPY 10/11/2016  ultrasound guided breast biopsy Left 10/26/2021  COLONOSCOPY 01/25/2011 Dr Ouida Sills  normal, in past adenomatous polyps  ELBOW SURGERY  left  Hernia  surgery repair  LAPAROSCOPIC TUBAL LIGATION  Right wrist hardware placed  UPPER GASTROINTESTINAL ENDOSCOPY [facility] 2004  cough and reflux good correlation by history from recods    OB History   Gravida  2  Para  2  Term  2  Preterm   AB   Living  2    SAB   IAB   Ectopic   Molar   Multiple   Live Births  2    Obstetric Comments  Age at first period 13 Age of first pregnancy 68      Social History   Socioeconomic History  Marital status: Widowed  Tobacco Use  Smoking status: Never  Smokeless tobacco: Never  Vaping Use  Vaping Use: Never used  Substance and Sexual Activity  Alcohol use: No  Alcohol/week: 0.0 standard drinks  Drug use: No  Sexual activity: Not Currently    Allergies  Allergen Reactions  Advil [Ibuprofen] Rash  Codeine Nausea  Gabapentin Other (See Comments)  nightmares  Lescol [Fluvastatin] Unknown  Lipitor [Atorvastatin] Muscle Pain  Pravachol [Pravastatin] Muscle Pain  Sulfa (Sulfonamide Antibiotics) Nausea  Tramadol Nausea  Aleve [Naproxen Sodium] Rash  Sulfasalazine Itching and Rash   Current Outpatient Medications  Medication Sig Dispense Refill  fluvastatin XL (LESCOL XL) 80 mg XL tablet TAKE 1 TABLET BY MOUTH EVERY DAY 90 tablet 3  loratadine (CLARITIN) 10 mg tablet TAKE 1 TABLET BY MOUTH EVERY DAY 90 tablet 3  montelukast (SINGULAIR) 10 mg tablet TAKE 1 TABLET BY MOUTH EVERY DAY AT NIGHT 90  tablet 3  olmesartan-hydroCHLOROthiazide (BENICAR HCT) 40-12.5 mg tablet TAKE 1 TABLET BY MOUTH EVERY DAY 90 tablet 3  pantoprazole (PROTONIX) 40 MG DR tablet *NEED OFFICE VISIT* TAKE 1 TABLET EVERY DAY 90 tablet 1  aspirin 81 MG EC tablet Take 81 mg by mouth once daily. (Patient not taking: Reported on 10/31/2021)  predniSONE (DELTASONE) 5 MG tablet TAKE 1 TABLET BY MOUTH EVERY DAY (Patient not taking: Reported on 10/31/2021) 30 tablet 0   Current Facility-Administered Medications  Medication Dose Route Frequency Provider  Last Rate Last Admin  cyanocobalamin (VITAMIN B12) injection 1,000 mcg 1,000 mcg Intramuscular Q30 Days Harrold Donath, MD 1,000 mcg at 10/14/21 1327   Family History  Problem Relation Age of Onset  Heart failure Mother  Pulmonary embolism Mother  High blood pressure (Hypertension) Mother  Diabetes Mother  Heart failure Father  Myocardial Infarction (Heart attack) Father  High blood pressure (Hypertension) Father  Emphysema Father  Breast cancer Daughter 73   Labs and Radiology:   October 26, 2021 pathology:  A. BREAST, LEFT AT 1:30 O'CLOCK, 10 CM FROM NIPPLE; ULTRASOUND-GUIDED  CORE NEEDLE BIOPSY:  - INVASIVE MAMMARY CARCINOMA, WITH PLEOMORPHIC LOBULAR FEATURES CASE SUMMARY: BREAST BIOMARKER TESTS  Estrogen Receptor (ER) Status: POSITIVE  Percentage of cells with nuclear positivity: Greater than 90%  Average intensity of staining: Strong   Progesterone Receptor (PgR) Status: POSITIVE  Percentage of cells with nuclear positivity: 11-20%  Average intensity of staining: Moderate   HER2 (by immunohistochemistry): NEGATIVE (Score 1+)  Ki-67: Not performed   Imaging review:  September 09, 2019 through December 26, 2021 imaging studies were independently reviewed. New density in the upper outer quadrant of the left breast 10 cm from the nipple. 4 mm nodular area, biopsy results above.  October 31, 2021 ultra sound:  This exam was done to determine if preoperative wire localization would be required. There is a small 0.4 x 0.41 x 0.57 cm density adjacent to the pectoralis fascia in the 130 o'clock position of the left breast, 10 cm from the nipple. Some distortion from recent biopsy. BI-RADS-6.  Bone density exam May 20, 2020:  REFERRING MD: Ouida Sills TECHNICIAN: Johnnye Sima HISTORY: FOLLOW UP BONE DENSITY. 81 YEAR OLD WHITE FEMALE. S/P RIGHT RIBS, RIGHT WRIST, AND LEFT ELBOW FRACTURES.   SITE DATE BMD g/cm T-SCORE Z-SCORE g/cm CHANGE % CHANGE  STATISTICALLY SIGNIFICANT?  LUMBAR SPINE L1- L4 05-06-13 0.783 -2.4   05-20-20 0.726 -2.9 -0.057 -7.2% YES  HIP L.FEM.NECK 05-06-13 0.593 -2.3   05-20-20 0.522 -2.9 -0.071 -12.0% YES   L.TOTAL HIP 05-06-13 0.681 -2.1   05-20-20 0.607 -2.7 -0.074 -10.9% YES  FOREARM L/R 33%     INTERPRETATION: Osteoporosis. Interval decrease in bone mineral density from 05-06-2013.     FRACTURE RISK ASSESSMENT (FRAX) _10____ 10 year risk of hip fracture. __27___ 10 year risk of any major fracture.   TREATMENT:  The National Osteoporosis Foundation (2014) Treatment Guidelines for: Consider FDA-approved medical therapies in postmenopausal women and men aged 75 years and older, based on the following: A hip or vertebral (clinical or morphometric) fracture T-score ? -2.5 at the femoral neck or spine after appropriate evaluation to exclude secondary causes  Low bone mass (T-score between -1.0 and -2.5 at the femoral neck or spine) and a 10-year probability of a hip fracture ? 3% or a 10-year probability of a major osteoporosis-related fracture ? 20% based on the UA-adapted WHO algorithm Clinicians judgment and/or patient preferences may indicate treatment for people  with 10-year fracture probabilities above or below these levels BMD should be monitored two years after initiating therapy and at two-year intervals thereafter.  _______________________________________ Mee Hives, MD   Review of Systems  Constitutional: Negative for chills and fever.  Respiratory: Negative for cough.    Objective:  Physical Exam Exam conducted with a chaperone present.  Constitutional:  Appearance: Normal appearance.  Cardiovascular:  Rate and Rhythm: Normal rate and regular rhythm.  Pulses: Normal pulses.  Heart sounds: Normal heart sounds.  Pulmonary:  Effort: Pulmonary effort is normal.  Breath sounds: Normal breath sounds.  Chest:  Breasts: Right: Normal.  Left: Normal.  Musculoskeletal:   Cervical back: Neck supple.  Lymphadenopathy:  Upper Body:  Right upper body: No supraclavicular or axillary adenopathy.  Left upper body: No supraclavicular or axillary adenopathy.  Skin: General: Skin is warm and dry.  Neurological:  Mental Status: She is alert and oriented to person, place, and time.  Psychiatric:  Mood and Affect: Mood normal.  Behavior: Behavior normal.    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  Osteoporosis. Progressive change from 20 15-20 22.  Plan:   The patient's granddaughter is getting married in October and she is quite concerned about being able to fully enjoy this event. We reviewed the options for management with mastectomy and breast conservation being similar for long-term results. Based on the small tumor size, favorable histology and receptor status and her age sentinel node biopsy would not be mandated.  The patient reports that she would like to proceed with breast conservation.  She is scheduled to meet with medical oncology on Wednesday, September 27. Based on the information available I do not see that there will be a role for adjuvant chemotherapy but certainly indications for estrogen blockade. If an aromatase inhibitor is planned, she will need bone mineral support.  This note is partially prepared by Ledell Noss, CMA acting as a scribe in the presence of Dr. Hervey Ard, MD.   The documentation recorded by the scribe accurately reflects the service I personally performed and the decisions made by me.   Robert Bellow, MD FACS   Electronically signed by Mayer Masker, MD at 11/01/2021 8:09 AM EDT

## 2021-11-02 ENCOUNTER — Inpatient Hospital Stay: Payer: Medicare PPO

## 2021-11-02 ENCOUNTER — Inpatient Hospital Stay: Payer: Medicare PPO | Attending: Oncology | Admitting: Oncology

## 2021-11-02 ENCOUNTER — Encounter: Payer: Self-pay | Admitting: *Deleted

## 2021-11-02 ENCOUNTER — Encounter: Payer: Self-pay | Admitting: Oncology

## 2021-11-02 DIAGNOSIS — Z170 Estrogen receptor positive status [ER+]: Secondary | ICD-10-CM | POA: Insufficient documentation

## 2021-11-02 DIAGNOSIS — C50412 Malignant neoplasm of upper-outer quadrant of left female breast: Secondary | ICD-10-CM | POA: Diagnosis present

## 2021-11-02 DIAGNOSIS — Z79899 Other long term (current) drug therapy: Secondary | ICD-10-CM | POA: Diagnosis not present

## 2021-11-02 DIAGNOSIS — C50912 Malignant neoplasm of unspecified site of left female breast: Secondary | ICD-10-CM

## 2021-11-02 NOTE — Progress Notes (Signed)
Accompanied patient to initial medical oncology appointment.   Reviewed Breast Cancer treatment handbook.   Care plan summary given to patient.   Reviewed outreach programs and cancer center services.  

## 2021-11-04 ENCOUNTER — Ambulatory Visit
Admission: RE | Admit: 2021-11-04 | Discharge: 2021-11-04 | Disposition: A | Discharge status: Home / Self Care | Payer: Medicare PPO | Source: Ambulatory Visit | Attending: General Surgery | Admitting: General Surgery

## 2021-11-04 ENCOUNTER — Other Ambulatory Visit: Payer: Self-pay

## 2021-11-04 ENCOUNTER — Encounter
Admission: RE | Admit: 2021-11-04 | Discharge: 2021-11-04 | Disposition: A | Discharge status: Home / Self Care | Payer: Medicare PPO | Source: Ambulatory Visit | Attending: General Surgery | Admitting: General Surgery

## 2021-11-04 DIAGNOSIS — Z01812 Encounter for preprocedural laboratory examination: Secondary | ICD-10-CM | POA: Insufficient documentation

## 2021-11-04 DIAGNOSIS — C50912 Malignant neoplasm of unspecified site of left female breast: Secondary | ICD-10-CM | POA: Insufficient documentation

## 2021-11-04 HISTORY — DX: Unspecified osteoarthritis, unspecified site: M19.90

## 2021-11-04 HISTORY — DX: Malignant (primary) neoplasm, unspecified: C80.1

## 2021-11-04 MED ORDER — GADOPICLENOL 0.5 MMOL/ML IV SOLN
5.0000 mL | Freq: Once | INTRAVENOUS | Status: AC | PRN
  Administered 2021-11-04: 5 mL via INTRAVENOUS

## 2021-11-04 NOTE — Patient Instructions (Addendum)
Your procedure is scheduled on: 11/11/21 - Friday Report to the Registration Desk on the 1st floor of the [facility]. To find out your arrival time, please call [phone] between 1PM - 3PM on: 11/10/21 - Thursday If your arrival time is 6:00 am, do not arrive prior to that time as the [facility] entrance doors do not open until 6:00 am.  REMEMBER: Instructions that are not followed completely may result in serious medical risk, up to and including death; or upon the discretion of your surgeon and anesthesiologist your surgery may need to be rescheduled.  Do not eat food after midnight the night before surgery.  No gum chewing, lozengers or hard candies.  You may however, drink CLEAR liquids up to 2 hours before you are scheduled to arrive for your surgery. Do not drink anything within 2 hours of your scheduled arrival time.  Clear liquids include: - water  - apple juice without pulp - gatorade (not RED colors) - black coffee or tea (Do NOT add milk or creamers to the coffee or tea) Do NOT drink anything that is not on this list.  TAKE THESE MEDICATIONS THE MORNING OF SURGERY WITH A SIP OF WATER:  - pantoprazole (PROTONIX) 40 MG tablet - fluvastatin XL (LESCOL)  One week prior to surgery: Stop Anti-inflammatories (NSAIDS) such as Advil, Aleve, Ibuprofen, Motrin, Naproxen, Naprosyn and Aspirin based products such as Excedrin, Goodys Powder, BC Powder.  Stop ANY OVER THE COUNTER supplements until after surgery.  You may however, continue to take Tylenol if needed for pain up until the day of surgery.  No Alcohol for 24 hours before or after surgery.  No Smoking including e-cigarettes for 24 hours prior to surgery.  No chewable tobacco products for at least 6 hours prior to surgery.  No nicotine patches on the day of surgery.  Do not use any "recreational" drugs for at least a week prior to your surgery.  Please be advised that the combination of cocaine and anesthesia may  have negative outcomes, up to and including death. If you test positive for cocaine, your surgery will be cancelled.  On the morning of surgery brush your teeth with toothpaste and water, you may rinse your mouth with mouthwash if you wish. Do not swallow any toothpaste or mouthwash.  Use CHG Soap or wipes as directed on instruction sheet.  Do not wear jewelry, make-up, hairpins, clips or nail polish.  Do not wear lotions, powders, or perfumes.   Do not shave body from the neck down 48 hours prior to surgery just in case you cut yourself which could leave a site for infection.  Also, freshly shaved skin may become irritated if using the CHG soap.  Contact lenses, hearing aids and dentures may not be worn into surgery.  Do not bring valuables to the hospital. [facility] is not responsible for any missing/lost belongings or valuables.   Notify your doctor if there is any change in your medical condition (cold, fever, infection).  Wear comfortable clothing (specific to your surgery type) to the hospital.  After surgery, you can help prevent lung complications by doing breathing exercises.  Take deep breaths and cough every 1-2 hours. Your doctor may order a device called an Incentive Spirometer to help you take deep breaths. When coughing or sneezing, hold a pillow firmly against your incision with both hands. This is called "splinting." Doing this helps protect your incision. It also decreases belly discomfort.  If you are being admitted  to the hospital overnight, leave your suitcase in the car. After surgery it may be brought to your room.  If you are being discharged the day of surgery, you will not be allowed to drive home. You will need a responsible adult (18 years or older) to drive you home and stay with you that night.   If you are taking public transportation, you will need to have a responsible adult (18 years or older) with you. Please confirm with your physician that it  is acceptable to use public transportation.   Please call the [facility] at [phone] if you have any questions about these instructions.  Surgery Visitation Policy:  Patients undergoing a surgery or procedure may have two family members or support persons with them as long as the person is not COVID-19 positive or experiencing its symptoms.   Inpatient Visitation:    Visiting hours are 7 a.m. to 8 p.m. Up to four visitors are allowed at one time in a patient room, including children. The visitors may rotate out with other people during the day. One designated support person (adult) may remain overnight.

## 2021-11-07 ENCOUNTER — Encounter: Payer: Self-pay | Admitting: Urgent Care

## 2021-11-07 ENCOUNTER — Encounter
Admission: RE | Admit: 2021-11-07 | Discharge: 2021-11-07 | Disposition: A | Discharge status: Home / Self Care | Payer: Medicare PPO | Source: Ambulatory Visit | Attending: General Surgery | Admitting: General Surgery

## 2021-11-07 DIAGNOSIS — I491 Atrial premature depolarization: Secondary | ICD-10-CM | POA: Insufficient documentation

## 2021-11-07 DIAGNOSIS — Z01818 Encounter for other preprocedural examination: Secondary | ICD-10-CM | POA: Insufficient documentation

## 2021-11-07 DIAGNOSIS — Z01812 Encounter for preprocedural laboratory examination: Secondary | ICD-10-CM

## 2021-11-07 DIAGNOSIS — I4510 Unspecified right bundle-branch block: Secondary | ICD-10-CM | POA: Insufficient documentation

## 2021-11-07 LAB — BASIC METABOLIC PANEL
Anion gap: 9 (ref 5–15)
BUN: 26 mg/dL — ABNORMAL HIGH (ref 8–23)
CO2: 20 mmol/L — ABNORMAL LOW (ref 22–32)
Calcium: 9.1 mg/dL (ref 8.9–10.3)
Chloride: 111 mmol/L (ref 98–111)
Creatinine, Ser: 1.63 mg/dL — ABNORMAL HIGH (ref 0.44–1.00)
GFR, Estimated: 32 mL/min — ABNORMAL LOW (ref >60)
Glucose, Bld: 102 mg/dL — ABNORMAL HIGH (ref 70–99)
Potassium: 4 mmol/L (ref 3.5–5.1)
Sodium: 140 mmol/L (ref 135–145)

## 2021-11-07 LAB — CBC
HCT: 32.7 % — ABNORMAL LOW (ref 36.0–46.0)
Hemoglobin: 10.4 g/dL — ABNORMAL LOW (ref 12.0–15.0)
MCH: 28.6 pg (ref 26.0–34.0)
MCHC: 31.8 g/dL (ref 30.0–36.0)
MCV: 89.8 fL (ref 80.0–100.0)
Platelets: 261 10*3/uL (ref 150–400)
RBC: 3.64 MIL/uL — ABNORMAL LOW (ref 3.87–5.11)
RDW: 13 % (ref 11.5–15.5)
WBC: 9 10*3/uL (ref 4.0–10.5)
nRBC: 0 % (ref 0.0–0.2)

## 2021-11-10 ENCOUNTER — Other Ambulatory Visit: Payer: Self-pay | Admitting: General Surgery

## 2021-11-10 DIAGNOSIS — R928 Other abnormal and inconclusive findings on diagnostic imaging of breast: Secondary | ICD-10-CM

## 2021-11-11 ENCOUNTER — Other Ambulatory Visit: Payer: Self-pay

## 2021-11-11 ENCOUNTER — Encounter
Admission: RE | Disposition: A | Discharge status: Home / Self Care | Payer: Self-pay | Source: Home / Self Care | Attending: General Surgery

## 2021-11-11 ENCOUNTER — Ambulatory Visit
Admission: RE | Admit: 2021-11-11 | Discharge: 2021-11-11 | Disposition: A | Discharge status: Home / Self Care | Payer: Medicare PPO | Attending: General Surgery | Admitting: General Surgery

## 2021-11-11 ENCOUNTER — Encounter: Payer: Self-pay | Admitting: General Surgery

## 2021-11-11 ENCOUNTER — Ambulatory Visit
Admission: RE | Admit: 2021-11-11 | Discharge: 2021-11-11 | Disposition: A | Discharge status: Home / Self Care | Payer: Medicare PPO | Source: Ambulatory Visit | Attending: General Surgery | Admitting: General Surgery

## 2021-11-11 ENCOUNTER — Ambulatory Visit: Payer: Medicare PPO | Admitting: Anesthesiology

## 2021-11-11 DIAGNOSIS — K219 Gastro-esophageal reflux disease without esophagitis: Secondary | ICD-10-CM | POA: Insufficient documentation

## 2021-11-11 DIAGNOSIS — E785 Hyperlipidemia, unspecified: Secondary | ICD-10-CM | POA: Insufficient documentation

## 2021-11-11 DIAGNOSIS — C50412 Malignant neoplasm of upper-outer quadrant of left female breast: Secondary | ICD-10-CM | POA: Diagnosis present

## 2021-11-11 DIAGNOSIS — Z170 Estrogen receptor positive status [ER+]: Secondary | ICD-10-CM | POA: Diagnosis not present

## 2021-11-11 DIAGNOSIS — I129 Hypertensive chronic kidney disease with stage 1 through stage 4 chronic kidney disease, or unspecified chronic kidney disease: Secondary | ICD-10-CM | POA: Insufficient documentation

## 2021-11-11 DIAGNOSIS — N183 Chronic kidney disease, stage 3 unspecified: Secondary | ICD-10-CM | POA: Diagnosis not present

## 2021-11-11 DIAGNOSIS — R928 Other abnormal and inconclusive findings on diagnostic imaging of breast: Secondary | ICD-10-CM

## 2021-11-11 HISTORY — PX: BREAST LUMPECTOMY: SHX2

## 2021-11-11 SURGERY — BREAST LUMPECTOMY
Anesthesia: General | Laterality: Left

## 2021-11-11 MED ORDER — EPHEDRINE SULFATE (PRESSORS) 50 MG/ML IJ SOLN
INTRAMUSCULAR | Status: DC | PRN
  Administered 2021-11-11 (×2): 10 mg via INTRAVENOUS

## 2021-11-11 MED ORDER — CHLORHEXIDINE GLUCONATE 0.12 % MT SOLN: 15.0000 mL | Freq: Once | OROMUCOSAL | Status: AC

## 2021-11-11 MED ORDER — EPHEDRINE 5 MG/ML INJ
INTRAVENOUS | Status: AC
  Filled 2021-11-11: qty 5

## 2021-11-11 MED ORDER — DEXAMETHASONE SODIUM PHOSPHATE 10 MG/ML IJ SOLN
INTRAMUSCULAR | Status: AC
  Filled 2021-11-11: qty 1

## 2021-11-11 MED ORDER — ACETAMINOPHEN 10 MG/ML IV SOLN
INTRAVENOUS | Status: DC | PRN
  Administered 2021-11-11: 1000 mg via INTRAVENOUS

## 2021-11-11 MED ORDER — CHLORHEXIDINE GLUCONATE 0.12 % MT SOLN
OROMUCOSAL | Status: AC
  Administered 2021-11-11: 15 mL via OROMUCOSAL
  Filled 2021-11-11: qty 15

## 2021-11-11 MED ORDER — FENTANYL CITRATE (PF) 100 MCG/2ML IJ SOLN
INTRAMUSCULAR | Status: AC
  Filled 2021-11-11: qty 2

## 2021-11-11 MED ORDER — CHLORHEXIDINE GLUCONATE CLOTH 2 % EX PADS
6.0000 | MEDICATED_PAD | Freq: Once | CUTANEOUS | Status: AC
  Administered 2021-11-11: 6 via TOPICAL

## 2021-11-11 MED ORDER — HYDROCODONE-ACETAMINOPHEN 5-325 MG PO TABS: 1.0000 | ORAL_TABLET | ORAL | 0 refills | Status: DC | PRN

## 2021-11-11 MED ORDER — ORAL CARE MOUTH RINSE: 15.0000 mL | Freq: Once | OROMUCOSAL | Status: AC

## 2021-11-11 MED ORDER — DEXAMETHASONE SODIUM PHOSPHATE 10 MG/ML IJ SOLN
INTRAMUSCULAR | Status: DC | PRN
  Administered 2021-11-11: 10 mg via INTRAVENOUS

## 2021-11-11 MED ORDER — ONDANSETRON HCL 4 MG/2ML IJ SOLN
INTRAMUSCULAR | Status: DC | PRN
  Administered 2021-11-11: 4 mg via INTRAVENOUS

## 2021-11-11 MED ORDER — LIDOCAINE HCL (PF) 2 % IJ SOLN
INTRAMUSCULAR | Status: AC
  Filled 2021-11-11: qty 5

## 2021-11-11 MED ORDER — LIDOCAINE HCL (CARDIAC) PF 100 MG/5ML IV SOSY
PREFILLED_SYRINGE | INTRAVENOUS | Status: DC | PRN
  Administered 2021-11-11: 60 mg via INTRAVENOUS

## 2021-11-11 MED ORDER — ACETAMINOPHEN 10 MG/ML IV SOLN
INTRAVENOUS | Status: AC
  Filled 2021-11-11: qty 100

## 2021-11-11 MED ORDER — PROPOFOL 10 MG/ML IV BOLUS
INTRAVENOUS | Status: DC | PRN
  Administered 2021-11-11: 100 mg via INTRAVENOUS

## 2021-11-11 MED ORDER — BUPIVACAINE-EPINEPHRINE (PF) 0.5% -1:200000 IJ SOLN
INTRAMUSCULAR | Status: AC
  Filled 2021-11-11: qty 30

## 2021-11-11 MED ORDER — BUPIVACAINE-EPINEPHRINE (PF) 0.5% -1:200000 IJ SOLN
INTRAMUSCULAR | Status: DC | PRN
  Administered 2021-11-11: 30 mL

## 2021-11-11 MED ORDER — PROPOFOL 10 MG/ML IV BOLUS
INTRAVENOUS | Status: AC
  Filled 2021-11-11: qty 20

## 2021-11-11 MED ORDER — LACTATED RINGERS IV SOLN: INTRAVENOUS | Status: DC

## 2021-11-11 MED ORDER — FENTANYL CITRATE (PF) 100 MCG/2ML IJ SOLN
INTRAMUSCULAR | Status: DC | PRN
  Administered 2021-11-11 (×2): 25 ug via INTRAVENOUS

## 2021-11-11 MED ORDER — ONDANSETRON HCL 4 MG/2ML IJ SOLN
INTRAMUSCULAR | Status: AC
  Filled 2021-11-11: qty 2

## 2021-11-11 SURGICAL SUPPLY — 55 items
APL PRP STRL LF DISP 70% ISPRP (MISCELLANEOUS) ×1
BINDER BREAST LRG (GAUZE/BANDAGES/DRESSINGS) IMPLANT
BLADE BOVIE TIP EXT 4 (BLADE) IMPLANT
BLADE PHOTON ILLUMINATED (MISCELLANEOUS) IMPLANT
BLADE SURG 15 STRL SS SAFETY (BLADE) ×2 IMPLANT
BULB RESERV EVAC DRAIN JP 100C (MISCELLANEOUS) IMPLANT
CHLORAPREP W/TINT 26 (MISCELLANEOUS) ×1 IMPLANT
CNTNR SPEC 2.5X3XGRAD LEK (MISCELLANEOUS)
CONT SPEC 4OZ STER OR WHT (MISCELLANEOUS)
CONT SPEC 4OZ STRL OR WHT (MISCELLANEOUS)
CONTAINER SPEC 2.5X3XGRAD LEK (MISCELLANEOUS) IMPLANT
COVER PROBE FLX POLY STRL (MISCELLANEOUS) ×1 IMPLANT
DEVICE DUBIN SPECIMEN MAMMOGRA (MISCELLANEOUS) ×1 IMPLANT
DRAIN CHANNEL JP 15F RND 16 (MISCELLANEOUS) IMPLANT
DRAPE LAPAROTOMY TRNSV 106X77 (MISCELLANEOUS) ×1 IMPLANT
DRSG GAUZE FLUFF 36X18 (GAUZE/BANDAGES/DRESSINGS) ×1 IMPLANT
DRSG TELFA 3X8 NADH STRL (GAUZE/BANDAGES/DRESSINGS) ×1 IMPLANT
ELECT CAUTERY BLADE 6.4 (BLADE) ×1 IMPLANT
ELECT CAUTERY BLADE TIP 2.5 (TIP)
ELECT REM PT RETURN 9FT ADLT (ELECTROSURGICAL) ×1
ELECTRODE CAUTERY BLDE TIP 2.5 (TIP) IMPLANT
ELECTRODE REM PT RTRN 9FT ADLT (ELECTROSURGICAL) ×1 IMPLANT
GAUZE 4X4 16PLY ~~LOC~~+RFID DBL (SPONGE) ×1 IMPLANT
GLOVE BIO SURGEON STRL SZ7.5 (GLOVE) ×1 IMPLANT
GLOVE SURG UNDER LTX SZ8 (GLOVE) ×1 IMPLANT
GOWN STRL REUS W/ TWL LRG LVL3 (GOWN DISPOSABLE) ×2 IMPLANT
GOWN STRL REUS W/TWL LRG LVL3 (GOWN DISPOSABLE) ×2
KIT TURNOVER KIT A (KITS) ×1 IMPLANT
LABEL OR SOLS (LABEL) ×1 IMPLANT
MANIFOLD NEPTUNE II (INSTRUMENTS) ×1 IMPLANT
MARGIN MAP 10MM (MISCELLANEOUS) IMPLANT
NDL HYPO 25X1 1.5 SAFETY (NEEDLE) ×2 IMPLANT
NEEDLE HYPO 22GX1.5 SAFETY (NEEDLE) ×1 IMPLANT
NEEDLE HYPO 25X1 1.5 SAFETY (NEEDLE) ×2 IMPLANT
PACK BASIN MINOR ARMC (MISCELLANEOUS) ×1 IMPLANT
SHEARS FOC LG CVD HARMONIC 17C (MISCELLANEOUS) IMPLANT
SHEARS HARMONIC 9CM CVD (BLADE) IMPLANT
STRIP CLOSURE SKIN 1/2X4 (GAUZE/BANDAGES/DRESSINGS) ×1 IMPLANT
SUT ETHILON 3-0 FS-10 30 BLK (SUTURE)
SUT SILK 2 0 (SUTURE) ×1
SUT SILK 2-0 18XBRD TIE 12 (SUTURE) ×1 IMPLANT
SUT VIC AB 2-0 CT1 27 (SUTURE) ×2
SUT VIC AB 2-0 CT1 TAPERPNT 27 (SUTURE) ×2 IMPLANT
SUT VIC AB 3-0 54X BRD REEL (SUTURE) IMPLANT
SUT VIC AB 3-0 BRD 54 (SUTURE)
SUT VIC AB 4-0 FS2 27 (SUTURE) ×1 IMPLANT
SUTURE EHLN 3-0 FS-10 30 BLK (SUTURE) IMPLANT
SWABSTK COMLB BENZOIN TINCTURE (MISCELLANEOUS) ×1 IMPLANT
SYR 10ML LL (SYRINGE) ×1 IMPLANT
SYR BULB IRRIG 60ML STRL (SYRINGE) ×1 IMPLANT
TAPE TRANSPORE STRL 2 31045 (GAUZE/BANDAGES/DRESSINGS) IMPLANT
TRAP FLUID SMOKE EVACUATOR (MISCELLANEOUS) ×1 IMPLANT
TRAP NEPTUNE SPECIMEN COLLECT (MISCELLANEOUS) ×1 IMPLANT
WATER STERILE IRR 1000ML POUR (IV SOLUTION) ×1 IMPLANT
WATER STERILE IRR 500ML POUR (IV SOLUTION) ×1 IMPLANT

## 2021-11-11 NOTE — Anesthesia Preprocedure Evaluation (Signed)
Anesthesia Evaluation  Patient identified by MRN, date of birth, ID band Patient awake    Reviewed: Allergy & Precautions, NPO status , Patient's Chart, lab work & pertinent test results  History of Anesthesia Complications (+) PONV and history of anesthetic complications  Airway Mallampati: II  TM Distance: >3 FB Neck ROM: full    Dental  (+) Teeth Intact   Pulmonary neg pulmonary ROS,    Pulmonary exam normal breath sounds clear to auscultation       Cardiovascular Exercise Tolerance: Good hypertension, Pt. on medications negative cardio ROS Normal cardiovascular exam Rhythm:Regular     Neuro/Psych negative neurological ROS  negative psych ROS   GI/Hepatic negative GI ROS, Neg liver ROS, GERD  Medicated,  Endo/Other  negative endocrine ROS  Renal/GU CRFRenal diseasenegative Renal ROS  negative genitourinary   Musculoskeletal   Abdominal Normal abdominal exam  (+)   Peds negative pediatric ROS (+)  Hematology negative hematology ROS (+)   Anesthesia Other Findings Past Medical History: No date: Allergic rhinitis No date: Arthritis No date: Cancer ([place]) No date: Cervical disc disease No date: Chronic kidney disease     Comment:  STAGE 3 No date: GERD (gastroesophageal reflux disease) No date: H/O blood clots     Comment:  around her 63's No date: History of colon polyps No date: Hyperlipidemia No date: Hypertension No date: PONV (postoperative nausea and vomiting)     Comment:  nausea No date: Shingles  Past Surgical History: No date: ABDOMINAL HYSTERECTOMY 10/26/2021: BREAST BIOPSY; Left     Comment:  [place] Core Bx ribbon clip path pending 2013: COLONOSCOPY 10/11/2016: COLONOSCOPY WITH PROPOFOL; N/A     Comment:  Procedure: COLONOSCOPY WITH PROPOFOL;  Surgeon: Christene Lye, MD;  Location: [facility] ENDOSCOPY;  Service:               Endoscopy;  Laterality: N/A; No date: ELBOW  SURGERY; Left No date: EYE SURGERY     Comment:  both eyes cataract removed 09/18/2015: FEMORAL HERNIA REPAIR; Right     Comment:  Procedure: HERNIA REPAIR femoral  ADULT;  Surgeon:               Christene Lye, MD;  Location: [facility] ORS;  Service:              General;  Laterality: Right; No date: HERNIA REPAIR 09/06/2015: LAPAROSCOPIC BILATERAL SALPINGO OOPHERECTOMY; Bilateral     Comment:  Procedure: LAPAROSCOPIC BILATERAL SALPINGO OOPHORECTOMY;              Surgeon: Boykin Nearing, MD;  Location: [facility] ORS;               Service: Gynecology;  Laterality: Bilateral; No date: PARTIAL HYSTERECTOMY No date: TUBAL LIGATION No date: UPPER GI ENDOSCOPY No date: WRIST SURGERY; Right  BMI    Body Mass Index: 21.40 kg/m      Reproductive/Obstetrics negative OB ROS                             Anesthesia Physical Anesthesia Plan  ASA: 3  Anesthesia Plan: General   Post-op Pain Management:    Induction: Intravenous  PONV Risk Score and Plan: 1 and Ondansetron and Dexamethasone  Airway Management Planned: LMA  Additional Equipment:   Intra-op Plan:   Post-operative Plan: Extubation in OR  Informed Consent: I have  reviewed the patients History and Physical, chart, labs and discussed the procedure including the risks, benefits and alternatives for the proposed anesthesia with the patient or authorized representative who has indicated his/her understanding and acceptance.     Dental Advisory Given  Plan Discussed with: CRNA and Surgeon  Anesthesia Plan Comments:         Anesthesia Quick Evaluation

## 2021-11-11 NOTE — Anesthesia Procedure Notes (Signed)
Procedure Name: LMA Insertion Date/Time: 11/11/2021 8:41 AM  Performed by: Doreen Salvage, CRNAPre-anesthesia Checklist: Patient identified, Patient being monitored, Timeout performed, Emergency Drugs available and Suction available Patient Re-evaluated:Patient Re-evaluated prior to induction Oxygen Delivery Method: Circle system utilized Preoxygenation: Pre-oxygenation with 100% oxygen Induction Type: IV induction Ventilation: Mask ventilation without difficulty LMA: LMA inserted LMA Size: 3.0 Tube type: Oral Number of attempts: 1 Placement Confirmation: positive ETCO2 and breath sounds checked- equal and bilateral Tube secured with: Tape Dental Injury: Teeth and Oropharynx as per pre-operative assessment

## 2021-11-11 NOTE — Discharge Instructions (Signed)
AMBULATORY SURGERY  ?DISCHARGE INSTRUCTIONS ? ? ?The drugs that you were given will stay in your system until tomorrow so for the next 24 hours you should not: ? ?Drive an automobile ?Make any legal decisions ?Drink any alcoholic beverage ? ? ?You may resume regular meals tomorrow.  Today it is better to start with liquids and gradually work up to solid foods. ? ?You may eat anything you prefer, but it is better to start with liquids, then soup and crackers, and gradually work up to solid foods. ? ? ?Please notify your doctor immediately if you have any unusual bleeding, trouble breathing, redness and pain at the surgery site, drainage, fever, or pain not relieved by medication. ? ? ? ?Additional Instructions: ? ? ? ?Please contact your physician with any problems or Same Day Surgery at [phone], Monday through Friday 6 am to 4 pm, or [facility] Main number at [phone].  ?

## 2021-11-11 NOTE — Anesthesia Postprocedure Evaluation (Signed)
Anesthesia Post Note  Patient: Jane Griffin  Procedure(s) Performed: BREAST LUMPECTOMY (Left)  Patient location during evaluation: PACU Anesthesia Type: General Level of consciousness: awake and oriented Pain management: satisfactory to patient Vital Signs Assessment: post-procedure vital signs reviewed and stable Respiratory status: spontaneous breathing and nonlabored ventilation Cardiovascular status: stable Anesthetic complications: no   No notable events documented.   Last Vitals:  Vitals:   11/11/21 1025 11/11/21 1029  BP:    Pulse: 83 84  Resp:  16  Temp:  (!) 36.1 C  SpO2: 94%     Last Pain:  Vitals:   11/11/21 1029  TempSrc: Temporal  PainSc: 0-No pain                 VAN STAVEREN,Torell Minder

## 2021-11-11 NOTE — H&P (Signed)
JOVANNI ECKHART [id number] [birth year]     HPI: Healthy 81 y/o woman with small left breast cancer. For wide excision.   Medications Prior to Admission  Medication Sig Dispense Refill Last Dose   fluvastatin XL (LESCOL XL) 80 MG 24 hr tablet Take 80 mg by mouth daily.  2 11/11/2021 at 0600   olmesartan-hydrochlorothiazide (BENICAR HCT) 40-12.5 MG tablet Take 1 tablet by mouth daily.   11/10/2021   pantoprazole (PROTONIX) 40 MG tablet TAKE 1 TABLET BY MOUTH ONCE A DAY 30-60 MINUTES BEFORE FIRST MEAL OF THE DAY 90 tablet 1 11/11/2021 at 0600   acetaminophen (TYLENOL) 500 MG tablet Take 500 mg by mouth every 6 (six) hours as needed for mild pain.   11/07/2021   Cyanocobalamin (VITAMIN B-12 IJ) Inject as directed every 30 (thirty) days.   10/08/2021   Allergies  Allergen Reactions   Codeine Nausea Only   Gabapentin     nightmares   Lipitor [Atorvastatin]     Aches    Pravachol [Pravastatin Sodium]     aches   Tramadol     nausea   Aleve [Naproxen Sodium] Rash   Sulfa Antibiotics Itching and Rash   Sulfasalazine Itching and Rash   Past Medical History:  Diagnosis Date   Allergic rhinitis    Arthritis    Cancer ([facility])    Cervical disc disease    Chronic kidney disease    STAGE 3   GERD (gastroesophageal reflux disease)    H/O blood clots    around her 20's   History of colon polyps    Hyperlipidemia    Hypertension    PONV (postoperative nausea and vomiting)    nausea   Shingles    Past Surgical History:  Procedure Laterality Date   ABDOMINAL HYSTERECTOMY     BREAST BIOPSY Left 10/26/2021   [place] Core Bx ribbon clip path pending   COLONOSCOPY  2013   COLONOSCOPY WITH PROPOFOL N/A 10/11/2016   Procedure: COLONOSCOPY WITH PROPOFOL;  Surgeon: Christene Lye, MD;  Location: [facility] ENDOSCOPY;  Service: Endoscopy;  Laterality: N/A;   ELBOW SURGERY Left    EYE SURGERY     both eyes cataract removed   FEMORAL HERNIA REPAIR Right 09/18/2015   Procedure: HERNIA REPAIR femoral   ADULT;  Surgeon: Christene Lye, MD;  Location: [facility] ORS;  Service: General;  Laterality: Right;   HERNIA REPAIR     LAPAROSCOPIC BILATERAL SALPINGO OOPHERECTOMY Bilateral 09/06/2015   Procedure: LAPAROSCOPIC BILATERAL SALPINGO OOPHORECTOMY;  Surgeon: Boykin Nearing, MD;  Location: [facility] ORS;  Service: Gynecology;  Laterality: Bilateral;   PARTIAL HYSTERECTOMY     TUBAL LIGATION     UPPER GI ENDOSCOPY     WRIST SURGERY Right    Social History   Socioeconomic History   Marital status: Widowed    Spouse name: Not on file   Number of children: Not on file   Years of education: Not on file   Highest education level: Not on file  Occupational History    Comment: retired  Tobacco Use   Smoking status: Never   Smokeless tobacco: Never  Substance and Sexual Activity   Alcohol use: No   Drug use: No   Sexual activity: Not on file  Other Topics Concern   Not on file  Social History Narrative   Lives alone   Social Determinants of Health   Financial Resource Strain: Not on file  Food Insecurity: Not on file  Transportation Needs: Not  on file  Physical Activity: Not on file  Stress: Not on file  Social Connections: Not on file  Intimate Partner Violence: Not on file   Social History   Social History Narrative   Lives alone     ROS: Negative.     PE: HEENT: Negative. Lungs: Clear. Cardio: RR. Assessment/Plan:  Proceed with planned wide local excision left breast cancer.   Forest Gleason Keegan Ducey 11/11/2021

## 2021-11-11 NOTE — Transfer of Care (Signed)
Immediate Anesthesia Transfer of Care Note  Patient: Jane Griffin  Procedure(s) Performed: Procedure(s): BREAST LUMPECTOMY (Left)  Patient Location: PACU  Anesthesia Type:General  Level of Consciousness: sedated  Airway & Oxygen Therapy: Patient Spontanous Breathing and Patient connected to face mask oxygen  Post-op Assessment: Report given to RN and Post -op Vital signs reviewed and stable  Post vital signs: Reviewed and stable  Last Vitals:  Vitals:   11/11/21 0943 11/11/21 0945  BP: (!) 140/59   Pulse: 98 98  Resp: (!) 23 (!) 24  Temp: (!) 36 C   SpO2: 68% 93%    Complications: No apparent anesthesia complications

## 2021-11-11 NOTE — Op Note (Addendum)
Preoperative diagnosis: 5 mm cancer upper outer quadrant of left breast.  Postoperative diagnosis: Same.  Operative procedure: Wide excision of the upper outer quadrant of the left breast.  Operating surgeon: Hervey Ard, MD.  Anesthesia: General by LMA, Marcaine 0.5% with 1: 200,000 units of epinephrine, 30 cc.  Estimated blood loss: Less than 5 cc.  Clinical note: This 81 year old woman had a ultrasound-guided biopsy of peripherally located lesion in the upper outer quadrant of the left breast at the 130 o'clock position.  Biopsy showed invasive mammary carcinoma.  Patient desired breast conservation.  In office ultrasound confirmed the location of the area above the level of the pectoralis fascia.  She was brought to the operating for wide excision.  SCD stockings for DVT prevention.  Antibiotics were not indicated.  Operative note: Patient underwent general anesthesia and tolerated this well.  A small roll was placed behind the left shoulder and the breast and chest cleansed with ChloraPrep and draped.  Ultrasound was brought into the field and the area thought to represent the prior biopsy site was identified and marked.  Local anesthesia was infiltrated.  A curvilinear incision was made from the 12-30 o'clock position and the skin incised sharply.  Subcutaneous fat was elevated and then a block of tissue 3 x 3 x 1 cm in diameter was excised and specimen radiograph failed to show the suspected clip.  Additional tissue was taken inferiorly superiorly and laterally without clip identification.  At this point was elected to abandon the procedure.  Hemostasis was electrocautery.  The deep tissue was approximated with interrupted 2-0 Vicryl figure-of-eight sutures.  The skin was closed with a running 4-0 Vicryl subcuticular suture.  Benzoin and Steri-Strips followed by Telfa and fluff gauze and a compressive wrap were applied.  The tissue from the 1-2 30 o'clock position beginning at about 8 cm  extending up into the axillary envelope was removed, again without clip identification.   The patient and her son were informed that the biopsy site/ clip was not identified in spite of visualizing the area prior to surgery with ultrasound.  They are aware she will likely require a wire localization and re-excision in the future.

## 2021-11-12 ENCOUNTER — Encounter: Payer: Self-pay | Admitting: General Surgery

## 2021-11-16 ENCOUNTER — Other Ambulatory Visit: Payer: Self-pay | Admitting: Pathology

## 2021-11-16 LAB — SURGICAL PATHOLOGY

## 2021-11-17 ENCOUNTER — Other Ambulatory Visit: Payer: Self-pay | Admitting: General Surgery

## 2021-11-17 DIAGNOSIS — C50912 Malignant neoplasm of unspecified site of left female breast: Secondary | ICD-10-CM

## 2021-11-17 NOTE — Progress Notes (Signed)
q 

## 2021-11-23 ENCOUNTER — Inpatient Hospital Stay: Payer: Medicare PPO | Attending: Oncology | Admitting: Hospice and Palliative Medicine

## 2021-11-23 DIAGNOSIS — C50912 Malignant neoplasm of unspecified site of left female breast: Secondary | ICD-10-CM

## 2021-11-23 NOTE — Progress Notes (Signed)
Multidisciplinary Oncology Council Documentation  Jane Griffin was presented by our [facility] on 11/23/2021, which included representatives from:  Palliative Care Dietitian  Physical/Occupational Therapist Nurse Navigator Genetics Speech Therapist Social work Survivorship RN Financial Navigator Research RN   Jane Griffin currently presents with history of breast cancer  We reviewed previous medical and familial history, history of present illness, and recent lab results along with all available histopathologic and imaging studies. The [place] considered available treatment options and made the following recommendations/referrals:  Rehab screening, SW  The MOC is a meeting of clinicians from various specialty areas who evaluate and discuss patients for whom a multidisciplinary approach is being considered. Final determinations in the plan of care are those of the provider(s).   Today's extended care, comprehensive team conference, Jane Griffin was not present for the discussion and was not examined.

## 2021-11-25 ENCOUNTER — Telehealth: Payer: Self-pay | Admitting: *Deleted

## 2021-11-25 NOTE — Telephone Encounter (Signed)
Patient contacted regarding MOC referral to OT screen with maureen. Supportive care services introduced to patient. Pt is s/p lumpectomy.  Patient agreeable to apt. Scheduled for 11/1 at 930 am. Pt thanked me for calling her.

## 2021-11-29 ENCOUNTER — Ambulatory Visit: Payer: Medicare PPO | Admitting: Oncology

## 2021-11-29 ENCOUNTER — Institutional Professional Consult (permissible substitution): Payer: Medicare PPO | Admitting: Radiation Oncology

## 2021-11-29 ENCOUNTER — Inpatient Hospital Stay: Payer: Medicare PPO | Admitting: Licensed Clinical Social Worker

## 2021-11-29 DIAGNOSIS — C50912 Malignant neoplasm of unspecified site of left female breast: Secondary | ICD-10-CM

## 2021-11-29 NOTE — Progress Notes (Signed)
[place] Work  Initial Assessment   Jane Griffin is a 81 y.o. year old female contacted by phone. Clinical Social Work was referred by medical provider for assessment of psychosocial needs.   SDOH (Social Determinants of Health) assessments performed: Yes SDOH Interventions    Flowsheet Row Clinical Support from 11/29/2021 in [place] [facility] [place] Interventions   Food Insecurity Interventions Intervention Not Indicated  Housing Interventions Intervention Not Indicated  Transportation Interventions Patient Resources (Friends/Family), Intervention Not Indicated  Utilities Interventions Intervention Not Indicated  Alcohol Usage Interventions Intervention Not Indicated (Score <7)  Depression Interventions/Treatment  Counseling  Financial Strain Interventions Intervention Not Indicated  Physical Activity Interventions Intervention Not Indicated  Stress Interventions Intervention Not Indicated  Social Connections Interventions Intervention Not Indicated       SDOH Screenings   Food Insecurity: No Food Insecurity (11/29/2021)  Housing: Low Risk  (11/29/2021)  Transportation Needs: No Transportation Needs (11/29/2021)  Utilities: Not At Risk (11/29/2021)  Alcohol Screen: Low Risk  (11/29/2021)  Depression (PHQ2-9): Low Risk  (11/29/2021)  Financial Resource Strain: Medium Risk (11/29/2021)  Physical Activity: Insufficiently Active (11/29/2021)  Social Connections: Moderately Integrated (11/29/2021)  Stress: Stress Concern Present (11/29/2021)  Tobacco Use: Low Risk  (11/12/2021)     Distress Screen completed: No    11/02/2021   11:40 AM  ONCBCN DISTRESS SCREENING  Screening Type Initial Screening  Distress experienced in past week (1-10) 1      Family/Social Information:  Housing Arrangement: Patient lives alone, Monica Becton (Son)  [phone] main contact Family members/support persons in your life? Family and Medical  Staff Transportation concerns: no  Employment: Retired  .  Income source: Paediatric nurse concerns: No Type of concern: None Food access concerns: no Religious or spiritual practice: Not known Services Currently in place:  [employer] PPO  Coping/ Adjustment to diagnosis: Patient understands treatment plan and what happens next? yes Concerns about diagnosis and/or treatment: How will I care for myself and Quality of life Patient reported stressors: Anxiety/ nervousness, Adjusting to my illness, and Isolation/ feeling alone Hopes and/or priorities: N/A Patient enjoys  N/A Current coping skills/ strengths: Average or above average intelligence , Capable of independent living , Communication skills , Scientist, research (life sciences) , Motivation for treatment/growth , and Physical Health     SUMMARY: Current SDOH Barriers:  Limited social support  Clinical Social Work Clinical Goal(s):  No clinical social work goals at this time  Interventions: Discussed common feeling and emotions when being diagnosed with cancer, and the importance of support during treatment Informed patient of the support team roles and support services at [facility] Provided [place] contact information and encouraged patient to call with any questions or concerns Provided patient in formation on CSW role in patient care and information on calendar events and groups.  CSW will email information to [email or web address]  Follow Up Plan: Patient will contact CSW with any support or resource needs Patient verbalizes understanding of plan: Yes    Safiyah Cisney, LCSW

## 2021-12-07 ENCOUNTER — Ambulatory Visit: Payer: Medicare PPO | Admitting: Occupational Therapy

## 2021-12-21 ENCOUNTER — Ambulatory Visit
Admission: RE | Admit: 2021-12-21 | Discharge: 2021-12-21 | Disposition: A | Discharge status: Home / Self Care | Payer: Medicare PPO | Source: Ambulatory Visit | Attending: General Surgery | Admitting: General Surgery

## 2021-12-21 DIAGNOSIS — C50912 Malignant neoplasm of unspecified site of left female breast: Secondary | ICD-10-CM

## 2021-12-21 DIAGNOSIS — R922 Inconclusive mammogram: Secondary | ICD-10-CM | POA: Insufficient documentation

## 2021-12-21 DIAGNOSIS — Z1239 Encounter for other screening for malignant neoplasm of breast: Secondary | ICD-10-CM | POA: Diagnosis not present

## 2022-01-02 ENCOUNTER — Encounter: Payer: Self-pay | Admitting: Radiation Oncology

## 2022-01-02 ENCOUNTER — Ambulatory Visit
Admission: RE | Admit: 2022-01-02 | Discharge: 2022-01-02 | Disposition: A | Discharge status: Home / Self Care | Payer: Medicare PPO | Source: Ambulatory Visit | Attending: Radiation Oncology | Admitting: Radiation Oncology

## 2022-01-02 VITALS — BP 134/72 | HR 89 | Temp 97.4°F | Resp 12 | Ht 62.0 in | Wt 114.7 lb

## 2022-01-02 DIAGNOSIS — Z803 Family history of malignant neoplasm of breast: Secondary | ICD-10-CM | POA: Diagnosis not present

## 2022-01-02 DIAGNOSIS — K219 Gastro-esophageal reflux disease without esophagitis: Secondary | ICD-10-CM | POA: Insufficient documentation

## 2022-01-02 DIAGNOSIS — Z86010 Personal history of colonic polyps: Secondary | ICD-10-CM | POA: Diagnosis not present

## 2022-01-02 DIAGNOSIS — C50412 Malignant neoplasm of upper-outer quadrant of left female breast: Secondary | ICD-10-CM | POA: Insufficient documentation

## 2022-01-02 DIAGNOSIS — E785 Hyperlipidemia, unspecified: Secondary | ICD-10-CM | POA: Insufficient documentation

## 2022-01-02 DIAGNOSIS — N189 Chronic kidney disease, unspecified: Secondary | ICD-10-CM | POA: Insufficient documentation

## 2022-01-02 DIAGNOSIS — Z510 Encounter for antineoplastic radiation therapy: Secondary | ICD-10-CM | POA: Diagnosis not present

## 2022-01-02 DIAGNOSIS — I129 Hypertensive chronic kidney disease with stage 1 through stage 4 chronic kidney disease, or unspecified chronic kidney disease: Secondary | ICD-10-CM | POA: Insufficient documentation

## 2022-01-02 DIAGNOSIS — Z79899 Other long term (current) drug therapy: Secondary | ICD-10-CM | POA: Diagnosis not present

## 2022-01-02 NOTE — Consult Note (Signed)
NEW PATIENT EVALUATION  Name: Jane Griffin  MRN: [id number]  Date:   01/02/2022     DOB: 05/07/1940   This 81 y.o. 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   PREVIOUS INVESTIGATIONS:  Mammogram and ultrasound reviewed Clinical notes reviewed Pathology reports reviewed  HPI: Patient is a 81 year old female who presents with an abnormal mammogram of her left breast.  There was a suspicious lesion at the 1:30 position 10 cm from the nipple measuring 4 x 4 x 4 mm.  Axilla demonstrate no suspicious lymphadenopathy.  She underwent biopsy positive for invasive lobular carcinoma.  MRI of her breast showed 0.5 cm mass in the left breast no additional suspicious mass in the left breast and no axillary adenopathy was identified.  Right breast was unremarkable.  She underwent a wide local excision by Dr. Tollie Pizza for a 5 mm overall grade 2 invasive lobular carcinoma with margins clear 8.3 mm.  No regional lymph nodes were submitted.  Tumor was ER/PR positive HER2/neu not overexpressed.  There was some the discrepancy of a ribbon clip that was not excised although on repeat ultrasound it was unclear whether this was had migrated.  Although I recommendation for surgical excision was made I have reviewed the case with Dr. Tollie Pizza and do not feel that is indicated.  She is now referred to ration collagen for opinion.  She is doing well specifically denies breast tenderness cough or bone pain.  PLANNED TREATMENT REGIMEN: Left whole breast radiation hypofractionated  PAST MEDICAL HISTORY:  has a past  medical history of Allergic rhinitis, Arthritis, Cancer ([place]), Cervical disc disease, Chronic kidney disease, GERD (gastroesophageal reflux disease), H/O blood clots, History of colon polyps, Hyperlipidemia, Hypertension, PONV (postoperative nausea and vomiting), and Shingles.    PAST SURGICAL HISTORY:  Past Surgical History:  Procedure Laterality Date   ABDOMINAL HYSTERECTOMY     BREAST BIOPSY Left 10/26/2021   [place] Core Bx ribbon clip positive   BREAST LUMPECTOMY Left 11/11/2021   Procedure: BREAST LUMPECTOMY;  Surgeon: Robert Bellow, MD;  Location: [facility] ORS;  Service: General;  Laterality: Left;   BREAST LUMPECTOMY Left 11/11/2021   invasive lobular ca/neg margins/closest margin .3 mm   COLONOSCOPY  2013   COLONOSCOPY WITH PROPOFOL N/A 10/11/2016   Procedure: COLONOSCOPY WITH PROPOFOL;  Surgeon: Christene Lye, MD;  Location: [facility] ENDOSCOPY;  Service: Endoscopy;  Laterality: N/A;   ELBOW SURGERY Left    EYE SURGERY     both eyes cataract removed   FEMORAL HERNIA REPAIR Right 09/18/2015   Procedure: HERNIA REPAIR femoral  ADULT;  Surgeon: Christene Lye, MD;  Location: [facility] ORS;  Service: General;  Laterality: Right;   HERNIA REPAIR     LAPAROSCOPIC BILATERAL SALPINGO OOPHERECTOMY Bilateral 09/06/2015   Procedure: LAPAROSCOPIC BILATERAL SALPINGO OOPHORECTOMY;  Surgeon: Boykin Nearing, MD;  Location: [facility] ORS;  Service: Gynecology;  Laterality: Bilateral;   PARTIAL HYSTERECTOMY     TUBAL LIGATION     UPPER GI ENDOSCOPY     WRIST SURGERY Right     FAMILY HISTORY: family history includes Breast cancer (age of onset: 62) in her daughter;  Cancer in her daughter; Clotting disorder in her mother; Emphysema in her father; Heart failure in her father and mother.  SOCIAL HISTORY:  reports that she has never smoked. She has never used smokeless tobacco. She reports that she does not drink alcohol and does not use drugs.  ALLERGIES: Codeine, Gabapentin, Lipitor  [atorvastatin], Pravachol [pravastatin sodium], Tramadol, Aleve [naproxen sodium], Sulfa antibiotics, and Sulfasalazine  MEDICATIONS:  Current Outpatient Medications  Medication Sig Dispense Refill   acetaminophen (TYLENOL) 500 MG tablet Take 500 mg by mouth every 6 (six) hours as needed for mild pain.     Cyanocobalamin (VITAMIN B-12 IJ) Inject as directed every 30 (thirty) days.     fluvastatin XL (LESCOL XL) 80 MG 24 hr tablet Take 80 mg by mouth daily.  2   olmesartan-hydrochlorothiazide (BENICAR HCT) 40-12.5 MG tablet Take 1 tablet by mouth daily.     pantoprazole (PROTONIX) 40 MG tablet TAKE 1 TABLET BY MOUTH ONCE A DAY 30-60 MINUTES BEFORE FIRST MEAL OF THE DAY 90 tablet 1   No current facility-administered medications for this encounter.    ECOG PERFORMANCE STATUS:  0 - Asymptomatic  REVIEW OF SYSTEMS: Patient denies any weight loss, fatigue, weakness, fever, chills or night sweats. Patient denies any loss of vision, blurred vision. Patient denies any ringing  of the ears or hearing loss. No irregular heartbeat. Patient denies heart murmur or history of fainting. Patient denies any chest pain or pain radiating to her upper extremities. Patient denies any shortness of breath, difficulty breathing at night, cough or hemoptysis. Patient denies any swelling in the lower legs. Patient denies any nausea vomiting, vomiting of blood, or coffee ground material in the vomitus. Patient denies any stomach pain. Patient states has had normal bowel movements no significant constipation or diarrhea. Patient denies any dysuria, hematuria or significant nocturia. Patient denies any problems walking, swelling in the joints or loss of balance. Patient denies any skin changes, loss of hair or loss of weight. Patient denies any excessive worrying or anxiety or significant depression. Patient denies any problems with insomnia. Patient denies excessive thirst, polyuria, polydipsia. Patient denies any swollen  glands, patient denies easy bruising or easy bleeding. Patient denies any recent infections, allergies or URI. Patient "s visual fields have not changed significantly in recent time.   PHYSICAL EXAM: BP 134/72 (BP Location: Right Arm, Patient Position: Sitting, Cuff Size: Small)   Pulse 89   Temp (!) 97.4 F (36.3 C) (Tympanic)   Resp 12   Ht _0  (1.575 m) Comment: Stated Ht  Wt 114 lb 11.2 oz (52 kg)   BMI 20.98 kg/m  Patient status post wide local excision of the left breast.  Incision is well-healed.  No dominant masses noted in either breast.  No axillary or supraclavicular adenopathy is identified.  Well-developed well-nourished patient in NAD. HEENT reveals PERLA, EOMI, discs not visualized.  Oral cavity is clear. No oral mucosal lesions are identified. Neck is clear without evidence of cervical or supraclavicular adenopathy. Lungs are clear to A&P. Cardiac examination is essentially unremarkable with regular rate and rhythm without murmur rub or thrill. Abdomen is benign with no organomegaly or masses noted. Motor sensory and DTR levels are equal and symmetric in the upper and lower extremities. Cranial nerves II through XII are grossly intact. Proprioception is intact. No peripheral adenopathy or edema is identified. No motor or sensory levels are noted. Crude visual fields are within normal range.  LABORATORY DATA: Pathology reports reviewed compatible with above-stated  findings    RADIOLOGY RESULTS: Mammogram ultrasound and MRI scans reviewed compatible with above-stated findings   IMPRESSION: Stage Ia invasive lobular carcinoma the left breast status post wide local excision in 81 year old female  PLAN: At this time I have recommended whole breast radiation hypofractionated course of treatment over 3 weeks.  Would also boost her scar another 1000 centigrade based on the close margin.  Risks and benefits of treatment including skin reaction fatigue alteration of blood counts  possible inclusion of superficial lung all were reviewed in detail with the patient.  She seems to comprehend my treatment plan well.  I have personally 7 ordered CT simulation for later this week.  I would like to take this opportunity to thank you for allowing me to participate in the care of your patient.Noreene Filbert, MD

## 2022-01-04 ENCOUNTER — Encounter: Payer: Self-pay | Admitting: Oncology

## 2022-01-04 ENCOUNTER — Inpatient Hospital Stay: Payer: Medicare PPO | Attending: Oncology | Admitting: Oncology

## 2022-01-04 VITALS — BP 145/78 | HR 77 | Temp 97.8°F | Resp 16 | Ht 62.0 in | Wt 114.0 lb

## 2022-01-04 DIAGNOSIS — E785 Hyperlipidemia, unspecified: Secondary | ICD-10-CM | POA: Insufficient documentation

## 2022-01-04 DIAGNOSIS — K219 Gastro-esophageal reflux disease without esophagitis: Secondary | ICD-10-CM | POA: Diagnosis not present

## 2022-01-04 DIAGNOSIS — C50412 Malignant neoplasm of upper-outer quadrant of left female breast: Secondary | ICD-10-CM | POA: Insufficient documentation

## 2022-01-04 DIAGNOSIS — C50912 Malignant neoplasm of unspecified site of left female breast: Secondary | ICD-10-CM

## 2022-01-04 DIAGNOSIS — Z170 Estrogen receptor positive status [ER+]: Secondary | ICD-10-CM | POA: Diagnosis not present

## 2022-01-04 DIAGNOSIS — I129 Hypertensive chronic kidney disease with stage 1 through stage 4 chronic kidney disease, or unspecified chronic kidney disease: Secondary | ICD-10-CM | POA: Diagnosis not present

## 2022-01-04 DIAGNOSIS — Z803 Family history of malignant neoplasm of breast: Secondary | ICD-10-CM | POA: Diagnosis not present

## 2022-01-04 DIAGNOSIS — N183 Chronic kidney disease, stage 3 unspecified: Secondary | ICD-10-CM | POA: Diagnosis not present

## 2022-01-04 DIAGNOSIS — Z86010 Personal history of colonic polyps: Secondary | ICD-10-CM | POA: Insufficient documentation

## 2022-01-04 DIAGNOSIS — Z79899 Other long term (current) drug therapy: Secondary | ICD-10-CM | POA: Diagnosis not present

## 2022-01-04 NOTE — Progress Notes (Signed)
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  INTERVAL HISTORY: Patient returns to clinic today for further evaluation and discussion of her pathology results.  She underwent lumpectomy on September 11, 2021 and is now fully healed from her surgery.  Postop was complicated by retained clip in her breast.  She will initiate XRT next week.  She currently feels well and is asymptomatic. She has no neurologic complaints.  She denies any recent fevers or illnesses.  She has a good appetite and denies weight loss.  She has no chest pain, shortness of breath, cough, or hemoptysis.  She denies any nausea, vomiting, constipation, or diarrhea.  She has no urinary complaints.  Patient offers no specific complaints today.  REVIEW OF SYSTEMS:   Review of Systems  Constitutional: Negative.  Negative for fever, malaise/fatigue and weight loss.  Respiratory: Negative.  Negative for cough and shortness of breath.   Cardiovascular: Negative.  Negative for chest pain and leg swelling.  Gastrointestinal: Negative.  Negative for abdominal pain.  Genitourinary: Negative.  Negative for dysuria.  Musculoskeletal: Negative.  Negative for back pain.  Skin: Negative.  Negative for rash.  Neurological: Negative.  Negative for dizziness, focal weakness, weakness and headaches.  Psychiatric/Behavioral: Negative.  The patient is not nervous/anxious.     As per HPI. Otherwise, a complete review of systems is negative.  PAST MEDICAL HISTORY: Past Medical History:  Diagnosis Date    Allergic rhinitis    Arthritis    Cancer ([place])    Cervical disc disease    Chronic kidney disease    STAGE 3   GERD (gastroesophageal reflux disease)    H/O blood clots    around her 20's   History of colon polyps    Hyperlipidemia    Hypertension    PONV (postoperative nausea and vomiting)    nausea   Shingles     PAST SURGICAL HISTORY: Past Surgical History:  Procedure Laterality Date   ABDOMINAL HYSTERECTOMY     BREAST BIOPSY Left 10/26/2021   [place] Core Bx ribbon clip positive   BREAST LUMPECTOMY Left 11/11/2021   Procedure: BREAST LUMPECTOMY;  Surgeon: Robert Bellow, MD;  Location: [facility];  Service: General;  Laterality: Left;   BREAST LUMPECTOMY Left 11/11/2021   invasive lobular ca/neg margins/closest margin .3 mm   COLONOSCOPY  2013   COLONOSCOPY WITH PROPOFOL N/A 10/11/2016   Procedure: COLONOSCOPY WITH PROPOFOL;  Surgeon: Christene Lye, MD;  Location: [facility];  Service: Endoscopy;  Laterality: N/A;   ELBOW SURGERY Left    EYE SURGERY     both eyes cataract removed   FEMORAL HERNIA REPAIR Right 09/18/2015   Procedure: HERNIA REPAIR femoral  ADULT;  Surgeon: Christene Lye, MD;  Location: [facility];  Service: General;  Laterality: Right;   HERNIA REPAIR     LAPAROSCOPIC BILATERAL SALPINGO OOPHERECTOMY Bilateral 09/06/2015   Procedure: LAPAROSCOPIC BILATERAL SALPINGO OOPHORECTOMY;  Surgeon: Boykin Nearing, MD;  Location: [facility];  Service: Gynecology;  Laterality: Bilateral;   PARTIAL HYSTERECTOMY     TUBAL LIGATION     UPPER GI  ENDOSCOPY     WRIST SURGERY Right     FAMILY HISTORY: Family History  Problem Relation Age of Onset   Clotting disorder Mother    Heart failure Mother    Emphysema Father    Heart failure Father    Cancer Daughter        breast cancer   Breast cancer Daughter 72    ADVANCED DIRECTIVES (Y/N):  N  HEALTH MAINTENANCE: Social History   Tobacco Use   Smoking status: Never   Smokeless tobacco:  Never  Substance Use Topics   Alcohol use: No   Drug use: No     Colonoscopy:  PAP:  Bone density:  Lipid panel:  Allergies  Allergen Reactions   Codeine Nausea Only   Gabapentin     nightmares   Lipitor [Atorvastatin]     Aches    Pravachol [Pravastatin Sodium]     aches   Tramadol     nausea   Aleve [Naproxen Sodium] Rash   Sulfa Antibiotics Itching and Rash   Sulfasalazine Itching and Rash    Current Outpatient Medications  Medication Sig Dispense Refill   acetaminophen (TYLENOL) 500 MG tablet Take 500 mg by mouth every 6 (six) hours as needed for mild pain.     Cyanocobalamin (VITAMIN B-12 IJ) Inject as directed every 30 (thirty) days.     fluvastatin XL (LESCOL XL) 80 MG 24 hr tablet Take 80 mg by mouth daily.  2   olmesartan-hydrochlorothiazide (BENICAR HCT) 40-12.5 MG tablet Take 1 tablet by mouth daily.     pantoprazole (PROTONIX) 40 MG tablet TAKE 1 TABLET BY MOUTH ONCE A DAY 30-60 MINUTES BEFORE FIRST MEAL OF THE DAY 90 tablet 1   No current facility-administered medications for this visit.    OBJECTIVE: Vitals:   01/04/22 1100  BP: (!) 145/78  Pulse: 77  Resp: 16  Temp: 97.8 F (36.6 C)  SpO2: 99%     Body mass index is 20.85 kg/m.    ECOG FS:0 - Asymptomatic  General: Well-developed, well-nourished, no acute distress. Eyes: Pink conjunctiva, anicteric sclera. HEENT: Normocephalic, moist mucous membranes. Breast: Exam deferred today. Lungs: No audible wheezing or coughing. Heart: Regular rate and rhythm. Abdomen: Soft, nontender, no obvious distention. Musculoskeletal: No edema, cyanosis, or clubbing. Neuro: Alert, answering all questions appropriately. Cranial nerves grossly intact. Skin: No rashes or petechiae noted. Psych: Normal affect.  LAB RESULTS:  Lab Results  Component Value Date   NA 140 11/07/2021   K 4.0 11/07/2021   CL 111 11/07/2021   CO2 20 (L) 11/07/2021   GLUCOSE 102 (H) 11/07/2021   BUN 26 (H) 11/07/2021    CREATININE 1.63 (H) 11/07/2021   CALCIUM 9.1 11/07/2021   PROT 7.9 09/18/2015   ALBUMIN 4.0 09/18/2015   AST 19 09/18/2015   ALT 12 (L) 09/18/2015   ALKPHOS 80 09/18/2015   BILITOT 1.1 09/18/2015   GFRNONAA 32 (L) 11/07/2021   GFRAA 48 (L) 09/18/2015    Lab Results  Component Value Date   WBC 9.0 11/07/2021   NEUTROABS 6.1 10/10/2012   HGB 10.4 (L) 11/07/2021   HCT 32.7 (L) 11/07/2021   MCV 89.8 11/07/2021   PLT 261 11/07/2021     STUDIES: MM DIAG BREAST TOMO UNI LEFT  Result Date: 12/21/2021 CLINICAL DATA:  Patient with biopsy-proven LEFT breast invasive lobular carcinoma. Patient is status post wide excision without preoperative localization of the clip. Clip was not identified in the specimen. Surgical pathology demonstrated a 5  mm focus of invasive lobular carcinoma with biopsy site change noted. EXAM: DIGITAL DIAGNOSTIC UNILATERAL LEFT MAMMOGRAM WITH TOMOSYNTHESIS; ULTRASOUND LEFT BREAST LIMITED TECHNIQUE: Left digital diagnostic mammography and breast tomosynthesis was performed.; Targeted ultrasound examination of the left breast was performed. COMPARISON:  Previous exam(s). ACR Breast Density Category c: The breast tissue is heterogeneously dense, which may obscure small masses. FINDINGS: There is new architectural distortion and density noted throughout the LEFT upper outer breast at posterior depth. RIBBON clip is noted in the LEFT upper outer breast posterior depth on MLO view only. It is located in the vicinity of previously described irregular mass with associated architectural distortion by landmark orientation. There is new postsurgical change located immediately anterior to this clip. No new suspicious findings are noted in the LEFT breast On physical exam, there is a well-healed surgical incision in the LEFT upper outer breast. Targeted ultrasound was performed of the LEFT upper outer breast. Postsurgical changes are noted. Previously described irregular mass is not  definitively visualized sonographically. There is a linear echogenic focus which is favored to reflect the RIBBON clip noted in the LEFT axilla immediately adjacent to the pectoral muscle. There is mild hypoechogenicity adjacent to this. IMPRESSION: 1. RIBBON clip remains within the LEFT upper outer breast at far posterior depth. It is unclear whether it was displaced from malignancy site at time of biopsy/surgery or whether it is within a second site of malignancy (felt less likely). Given interval postsurgical changes, it is now in the low LEFT axilla. A possible sonographic correlate for the RIBBON clip is identified if attempt at preoperative localization is desired. RECOMMENDATION: Recommend surgical excision of the RIBBON clip to definitively exclude second residual malignancy. If surgical excision is not pursued, recommend close attention on follow-up bilateral diagnostic mammogram (with RIGHT and LEFT ultrasound if deemed necessary), recommend in August 2024. I have discussed the findings and recommendations with the patient. If applicable, a reminder letter will be sent to the patient regarding the next appointment. BI-RADS CATEGORY  6: Known biopsy-proven malignancy. Electronically Signed   By: Valentino Saxon M.D.   On: 12/21/2021 14:00  US BREAST LTD UNI LEFT INC AXILLA  Result Date: 12/21/2021 CLINICAL DATA:  Patient with biopsy-proven LEFT breast invasive lobular carcinoma. Patient is status post wide excision without preoperative localization of the clip. Clip was not identified in the specimen. Surgical pathology demonstrated a 5 mm focus of invasive lobular carcinoma with biopsy site change noted. EXAM: DIGITAL DIAGNOSTIC UNILATERAL LEFT MAMMOGRAM WITH TOMOSYNTHESIS; ULTRASOUND LEFT BREAST LIMITED TECHNIQUE: Left digital diagnostic mammography and breast tomosynthesis was performed.; Targeted ultrasound examination of the left breast was performed. COMPARISON:  Previous exam(s). ACR Breast  Density Category c: The breast tissue is heterogeneously dense, which may obscure small masses. FINDINGS: There is new architectural distortion and density noted throughout the LEFT upper outer breast at posterior depth. RIBBON clip is noted in the LEFT upper outer breast posterior depth on MLO view only. It is located in the vicinity of previously described irregular mass with associated architectural distortion by landmark orientation. There is new postsurgical change located immediately anterior to this clip. No new suspicious findings are noted in the LEFT breast On physical exam, there is a well-healed surgical incision in the LEFT upper outer breast. Targeted ultrasound was performed of the LEFT upper outer breast. Postsurgical changes are noted. Previously described irregular mass is not definitively visualized sonographically. There is a linear echogenic focus which is favored to reflect the RIBBON clip noted in  the LEFT axilla immediately adjacent to the pectoral muscle. There is mild hypoechogenicity adjacent to this. IMPRESSION: 1. RIBBON clip remains within the LEFT upper outer breast at far posterior depth. It is unclear whether it was displaced from malignancy site at time of biopsy/surgery or whether it is within a second site of malignancy (felt less likely). Given interval postsurgical changes, it is now in the low LEFT axilla. A possible sonographic correlate for the RIBBON clip is identified if attempt at preoperative localization is desired. RECOMMENDATION: Recommend surgical excision of the RIBBON clip to definitively exclude second residual malignancy. If surgical excision is not pursued, recommend close attention on follow-up bilateral diagnostic mammogram (with RIGHT and LEFT ultrasound if deemed necessary), recommend in August 2024. I have discussed the findings and recommendations with the patient. If applicable, a reminder letter will be sent to the patient regarding the next appointment.  BI-RADS CATEGORY  6: Known biopsy-proven malignancy. Electronically Signed   By: Valentino Saxon M.D.   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  MRI of the breast completed on November 04, 2021 did not reveal any additional malignancy.  Given the small size of the mass, she is considered a T1a therefore Oncotype testing nor chemotherapy is necessary.  Patient will benefit from an aromatase inhibitor for 5 years.  She will initiate XRT in the next week.  Return to clinic in approximately 6 to 8 weeks at the conclusion of XRT for further evaluation and initiation of letrozole.  Bone health: We will get baseline bone mineral density in the next 1 to 2 weeks.  I spent a total of 20 minutes reviewing chart data, face-to-face evaluation with the patient, counseling and coordination of care as detailed above.   Patient expressed understanding and was in agreement with this plan. She also understands that She can call clinic at any time with any questions, concerns, or complaints.    Cancer Staging  Invasive ductal carcinoma of left breast [facility]) Staging form: Breast, AJCC 8th Edition - Clinical stage from 11/04/2021: Stage IA (cT1a, cN0, cM0, G2, ER+, PR+, HER2-) - Signed by Lloyd Huger, MD on 11/04/2021 Stage prefix: Initial diagnosis Histologic grading system: 3 grade system  Lloyd Huger, MD   01/04/2022 3:09 PM

## 2022-01-05 ENCOUNTER — Encounter: Payer: Self-pay | Admitting: *Deleted

## 2022-01-05 ENCOUNTER — Ambulatory Visit
Admission: RE | Admit: 2022-01-05 | Discharge: 2022-01-05 | Disposition: A | Discharge status: Home / Self Care | Payer: Medicare PPO | Source: Ambulatory Visit | Attending: Radiation Oncology | Admitting: Radiation Oncology

## 2022-01-05 DIAGNOSIS — Z510 Encounter for antineoplastic radiation therapy: Secondary | ICD-10-CM | POA: Diagnosis not present

## 2022-01-06 ENCOUNTER — Ambulatory Visit
Admission: RE | Admit: 2022-01-06 | Discharge: 2022-01-06 | Disposition: A | Discharge status: Home / Self Care | Payer: Medicare PPO | Source: Ambulatory Visit | Attending: Oncology | Admitting: Oncology

## 2022-01-06 DIAGNOSIS — Z780 Asymptomatic menopausal state: Secondary | ICD-10-CM | POA: Diagnosis not present

## 2022-01-06 DIAGNOSIS — Z853 Personal history of malignant neoplasm of breast: Secondary | ICD-10-CM | POA: Diagnosis not present

## 2022-01-06 DIAGNOSIS — Z13820 Encounter for screening for osteoporosis: Secondary | ICD-10-CM | POA: Insufficient documentation

## 2022-01-06 DIAGNOSIS — M810 Age-related osteoporosis without current pathological fracture: Secondary | ICD-10-CM | POA: Insufficient documentation

## 2022-01-06 DIAGNOSIS — C50912 Malignant neoplasm of unspecified site of left female breast: Secondary | ICD-10-CM | POA: Insufficient documentation

## 2022-01-09 ENCOUNTER — Telehealth: Payer: Self-pay | Admitting: *Deleted

## 2022-01-09 DIAGNOSIS — Z86010 Personal history of colonic polyps: Secondary | ICD-10-CM | POA: Diagnosis not present

## 2022-01-09 DIAGNOSIS — E785 Hyperlipidemia, unspecified: Secondary | ICD-10-CM | POA: Diagnosis not present

## 2022-01-09 DIAGNOSIS — Z510 Encounter for antineoplastic radiation therapy: Secondary | ICD-10-CM | POA: Insufficient documentation

## 2022-01-09 DIAGNOSIS — C50412 Malignant neoplasm of upper-outer quadrant of left female breast: Secondary | ICD-10-CM | POA: Diagnosis not present

## 2022-01-09 DIAGNOSIS — Z170 Estrogen receptor positive status [ER+]: Secondary | ICD-10-CM | POA: Insufficient documentation

## 2022-01-09 DIAGNOSIS — I129 Hypertensive chronic kidney disease with stage 1 through stage 4 chronic kidney disease, or unspecified chronic kidney disease: Secondary | ICD-10-CM | POA: Diagnosis not present

## 2022-01-09 DIAGNOSIS — Z803 Family history of malignant neoplasm of breast: Secondary | ICD-10-CM | POA: Diagnosis not present

## 2022-01-09 DIAGNOSIS — N189 Chronic kidney disease, unspecified: Secondary | ICD-10-CM | POA: Diagnosis not present

## 2022-01-09 DIAGNOSIS — Z79899 Other long term (current) drug therapy: Secondary | ICD-10-CM | POA: Insufficient documentation

## 2022-01-09 DIAGNOSIS — K219 Gastro-esophageal reflux disease without esophagitis: Secondary | ICD-10-CM | POA: Diagnosis not present

## 2022-01-09 MED ORDER — ALENDRONATE SODIUM 70 MG PO TABS: 70.0000 mg | ORAL_TABLET | ORAL | 3 refills | Status: DC

## 2022-01-09 NOTE — Telephone Encounter (Signed)
Called pt to inform her of her osteopenia from the BMD. Instructed her to get OTC calcium + Vit D take 1 tablet twice a day. Also MD will be sending prescription for Fosamax into her pharmacy. Educated to drink full 8 oz of water and to take it standing and to walk around for a few minutes to assure the pill has gone down all the way. This is once a week. Pt agrees to plan.

## 2022-01-12 ENCOUNTER — Other Ambulatory Visit: Payer: Self-pay | Admitting: *Deleted

## 2022-01-12 DIAGNOSIS — C50412 Malignant neoplasm of upper-outer quadrant of left female breast: Secondary | ICD-10-CM

## 2022-01-16 ENCOUNTER — Ambulatory Visit: Admission: RE | Admit: 2022-01-16 | Payer: Medicare PPO | Source: Ambulatory Visit

## 2022-01-16 DIAGNOSIS — Z510 Encounter for antineoplastic radiation therapy: Secondary | ICD-10-CM | POA: Diagnosis not present

## 2022-01-17 ENCOUNTER — Ambulatory Visit
Admission: RE | Admit: 2022-01-17 | Discharge: 2022-01-17 | Disposition: A | Discharge status: Home / Self Care | Payer: Medicare PPO | Source: Ambulatory Visit | Attending: Radiation Oncology | Admitting: Radiation Oncology

## 2022-01-17 ENCOUNTER — Other Ambulatory Visit: Payer: Self-pay

## 2022-01-17 DIAGNOSIS — Z510 Encounter for antineoplastic radiation therapy: Secondary | ICD-10-CM | POA: Diagnosis not present

## 2022-01-17 LAB — RAD ONC ARIA SESSION SUMMARY
Course Elapsed Days: 0
Plan Fractions Treated to Date: 1
Plan Prescribed Dose Per Fraction: 2.66 Gy
Plan Total Fractions Prescribed: 16
Plan Total Prescribed Dose: 42.56 Gy
Reference Point Dosage Given to Date: 2.66 Gy
Reference Point Session Dosage Given: 2.66 Gy
Session Number: 1

## 2022-01-18 ENCOUNTER — Ambulatory Visit
Admission: RE | Admit: 2022-01-18 | Discharge: 2022-01-18 | Disposition: A | Discharge status: Home / Self Care | Payer: Medicare PPO | Source: Ambulatory Visit | Attending: Radiation Oncology | Admitting: Radiation Oncology

## 2022-01-18 ENCOUNTER — Other Ambulatory Visit: Payer: Self-pay

## 2022-01-18 DIAGNOSIS — Z510 Encounter for antineoplastic radiation therapy: Secondary | ICD-10-CM | POA: Diagnosis not present

## 2022-01-18 LAB — RAD ONC ARIA SESSION SUMMARY
Course Elapsed Days: 1
Plan Fractions Treated to Date: 2
Plan Prescribed Dose Per Fraction: 2.66 Gy
Plan Total Fractions Prescribed: 16
Plan Total Prescribed Dose: 42.56 Gy
Reference Point Dosage Given to Date: 5.32 Gy
Reference Point Session Dosage Given: 2.66 Gy
Session Number: 2

## 2022-01-19 ENCOUNTER — Ambulatory Visit
Admission: RE | Admit: 2022-01-19 | Discharge: 2022-01-19 | Disposition: A | Discharge status: Home / Self Care | Payer: Medicare PPO | Source: Ambulatory Visit | Attending: Radiation Oncology | Admitting: Radiation Oncology

## 2022-01-19 ENCOUNTER — Other Ambulatory Visit: Payer: Self-pay

## 2022-01-19 DIAGNOSIS — Z510 Encounter for antineoplastic radiation therapy: Secondary | ICD-10-CM | POA: Diagnosis not present

## 2022-01-19 LAB — RAD ONC ARIA SESSION SUMMARY
Course Elapsed Days: 2
Plan Fractions Treated to Date: 3
Plan Prescribed Dose Per Fraction: 2.66 Gy
Plan Total Fractions Prescribed: 16
Plan Total Prescribed Dose: 42.56 Gy
Reference Point Dosage Given to Date: 7.98 Gy
Reference Point Session Dosage Given: 2.66 Gy
Session Number: 3

## 2022-01-20 ENCOUNTER — Other Ambulatory Visit: Payer: Self-pay

## 2022-01-20 ENCOUNTER — Ambulatory Visit
Admission: RE | Admit: 2022-01-20 | Discharge: 2022-01-20 | Disposition: A | Discharge status: Home / Self Care | Payer: Medicare PPO | Source: Ambulatory Visit | Attending: Radiation Oncology | Admitting: Radiation Oncology

## 2022-01-20 DIAGNOSIS — Z510 Encounter for antineoplastic radiation therapy: Secondary | ICD-10-CM | POA: Diagnosis not present

## 2022-01-20 LAB — RAD ONC ARIA SESSION SUMMARY
Course Elapsed Days: 3
Plan Fractions Treated to Date: 4
Plan Prescribed Dose Per Fraction: 2.66 Gy
Plan Total Fractions Prescribed: 16
Plan Total Prescribed Dose: 42.56 Gy
Reference Point Dosage Given to Date: 10.64 Gy
Reference Point Session Dosage Given: 2.66 Gy
Session Number: 4

## 2022-01-23 ENCOUNTER — Ambulatory Visit
Admission: RE | Admit: 2022-01-23 | Discharge: 2022-01-23 | Disposition: A | Discharge status: Home / Self Care | Payer: Medicare PPO | Source: Ambulatory Visit | Attending: Radiation Oncology | Admitting: Radiation Oncology

## 2022-01-23 ENCOUNTER — Inpatient Hospital Stay: Payer: Medicare PPO

## 2022-01-23 ENCOUNTER — Other Ambulatory Visit: Payer: Self-pay

## 2022-01-23 DIAGNOSIS — C50412 Malignant neoplasm of upper-outer quadrant of left female breast: Secondary | ICD-10-CM | POA: Insufficient documentation

## 2022-01-23 DIAGNOSIS — Z170 Estrogen receptor positive status [ER+]: Secondary | ICD-10-CM | POA: Insufficient documentation

## 2022-01-23 DIAGNOSIS — Z510 Encounter for antineoplastic radiation therapy: Secondary | ICD-10-CM | POA: Diagnosis not present

## 2022-01-23 LAB — CBC
HCT: 33.8 % — ABNORMAL LOW (ref 36.0–46.0)
Hemoglobin: 11 g/dL — ABNORMAL LOW (ref 12.0–15.0)
MCH: 29.3 pg (ref 26.0–34.0)
MCHC: 32.5 g/dL (ref 30.0–36.0)
MCV: 90.1 fL (ref 80.0–100.0)
Platelets: 241 10*3/uL (ref 150–400)
RBC: 3.75 MIL/uL — ABNORMAL LOW (ref 3.87–5.11)
RDW: 13.3 % (ref 11.5–15.5)
WBC: 8.7 10*3/uL (ref 4.0–10.5)
nRBC: 0 % (ref 0.0–0.2)

## 2022-01-23 LAB — RAD ONC ARIA SESSION SUMMARY
Course Elapsed Days: 6
Plan Fractions Treated to Date: 5
Plan Prescribed Dose Per Fraction: 2.66 Gy
Plan Total Fractions Prescribed: 16
Plan Total Prescribed Dose: 42.56 Gy
Reference Point Dosage Given to Date: 13.3 Gy
Reference Point Session Dosage Given: 2.66 Gy
Session Number: 5

## 2022-01-24 ENCOUNTER — Other Ambulatory Visit: Payer: Self-pay

## 2022-01-24 ENCOUNTER — Ambulatory Visit
Admission: RE | Admit: 2022-01-24 | Discharge: 2022-01-24 | Disposition: A | Discharge status: Home / Self Care | Payer: Medicare PPO | Source: Ambulatory Visit | Attending: Radiation Oncology | Admitting: Radiation Oncology

## 2022-01-24 DIAGNOSIS — Z510 Encounter for antineoplastic radiation therapy: Secondary | ICD-10-CM | POA: Diagnosis not present

## 2022-01-24 LAB — RAD ONC ARIA SESSION SUMMARY
Course Elapsed Days: 7
Plan Fractions Treated to Date: 6
Plan Prescribed Dose Per Fraction: 2.66 Gy
Plan Total Fractions Prescribed: 16
Plan Total Prescribed Dose: 42.56 Gy
Reference Point Dosage Given to Date: 15.96 Gy
Reference Point Session Dosage Given: 2.66 Gy
Session Number: 6

## 2022-01-25 ENCOUNTER — Other Ambulatory Visit: Payer: Self-pay

## 2022-01-25 ENCOUNTER — Ambulatory Visit
Admission: RE | Admit: 2022-01-25 | Discharge: 2022-01-25 | Disposition: A | Discharge status: Home / Self Care | Payer: Medicare PPO | Source: Ambulatory Visit | Attending: Radiation Oncology | Admitting: Radiation Oncology

## 2022-01-25 DIAGNOSIS — Z510 Encounter for antineoplastic radiation therapy: Secondary | ICD-10-CM | POA: Diagnosis not present

## 2022-01-25 LAB — RAD ONC ARIA SESSION SUMMARY
Course Elapsed Days: 8
Plan Fractions Treated to Date: 7
Plan Prescribed Dose Per Fraction: 2.66 Gy
Plan Total Fractions Prescribed: 16
Plan Total Prescribed Dose: 42.56 Gy
Reference Point Dosage Given to Date: 18.62 Gy
Reference Point Session Dosage Given: 2.66 Gy
Session Number: 7

## 2022-01-26 ENCOUNTER — Ambulatory Visit
Admission: RE | Admit: 2022-01-26 | Discharge: 2022-01-26 | Disposition: A | Discharge status: Home / Self Care | Payer: Medicare PPO | Source: Ambulatory Visit | Attending: Radiation Oncology | Admitting: Radiation Oncology

## 2022-01-26 ENCOUNTER — Other Ambulatory Visit: Payer: Self-pay

## 2022-01-26 DIAGNOSIS — Z510 Encounter for antineoplastic radiation therapy: Secondary | ICD-10-CM | POA: Diagnosis not present

## 2022-01-26 LAB — RAD ONC ARIA SESSION SUMMARY
Course Elapsed Days: 9
Plan Fractions Treated to Date: 8
Plan Prescribed Dose Per Fraction: 2.66 Gy
Plan Total Fractions Prescribed: 16
Plan Total Prescribed Dose: 42.56 Gy
Reference Point Dosage Given to Date: 21.28 Gy
Reference Point Session Dosage Given: 2.66 Gy
Session Number: 8

## 2022-01-27 ENCOUNTER — Other Ambulatory Visit: Payer: Self-pay

## 2022-01-27 ENCOUNTER — Ambulatory Visit
Admission: RE | Admit: 2022-01-27 | Discharge: 2022-01-27 | Disposition: A | Discharge status: Home / Self Care | Payer: Medicare PPO | Source: Ambulatory Visit | Attending: Radiation Oncology | Admitting: Radiation Oncology

## 2022-01-27 DIAGNOSIS — Z510 Encounter for antineoplastic radiation therapy: Secondary | ICD-10-CM | POA: Diagnosis not present

## 2022-01-27 LAB — RAD ONC ARIA SESSION SUMMARY
Course Elapsed Days: 10
Plan Fractions Treated to Date: 9
Plan Prescribed Dose Per Fraction: 2.66 Gy
Plan Total Fractions Prescribed: 16
Plan Total Prescribed Dose: 42.56 Gy
Reference Point Dosage Given to Date: 23.94 Gy
Reference Point Session Dosage Given: 2.66 Gy
Session Number: 9

## 2022-01-31 ENCOUNTER — Ambulatory Visit
Admission: RE | Admit: 2022-01-31 | Discharge: 2022-01-31 | Disposition: A | Discharge status: Home / Self Care | Payer: Medicare PPO | Source: Ambulatory Visit | Attending: Radiation Oncology | Admitting: Radiation Oncology

## 2022-01-31 ENCOUNTER — Other Ambulatory Visit: Payer: Self-pay

## 2022-01-31 DIAGNOSIS — Z510 Encounter for antineoplastic radiation therapy: Secondary | ICD-10-CM | POA: Diagnosis not present

## 2022-01-31 LAB — RAD ONC ARIA SESSION SUMMARY
Course Elapsed Days: 14
Plan Fractions Treated to Date: 10
Plan Prescribed Dose Per Fraction: 2.66 Gy
Plan Total Fractions Prescribed: 16
Plan Total Prescribed Dose: 42.56 Gy
Reference Point Dosage Given to Date: 26.6 Gy
Reference Point Session Dosage Given: 2.66 Gy
Session Number: 10

## 2022-02-01 ENCOUNTER — Other Ambulatory Visit: Payer: Self-pay

## 2022-02-01 ENCOUNTER — Ambulatory Visit
Admission: RE | Admit: 2022-02-01 | Discharge: 2022-02-01 | Disposition: A | Discharge status: Home / Self Care | Payer: Medicare PPO | Source: Ambulatory Visit | Attending: Radiation Oncology | Admitting: Radiation Oncology

## 2022-02-01 DIAGNOSIS — Z510 Encounter for antineoplastic radiation therapy: Secondary | ICD-10-CM | POA: Diagnosis not present

## 2022-02-01 LAB — RAD ONC ARIA SESSION SUMMARY
Course Elapsed Days: 15
Plan Fractions Treated to Date: 11
Plan Prescribed Dose Per Fraction: 2.66 Gy
Plan Total Fractions Prescribed: 16
Plan Total Prescribed Dose: 42.56 Gy
Reference Point Dosage Given to Date: 29.26 Gy
Reference Point Session Dosage Given: 2.66 Gy
Session Number: 11

## 2022-02-02 ENCOUNTER — Ambulatory Visit: Payer: Medicare PPO | Admitting: Oncology

## 2022-02-02 ENCOUNTER — Other Ambulatory Visit: Payer: Self-pay

## 2022-02-02 ENCOUNTER — Ambulatory Visit
Admission: RE | Admit: 2022-02-02 | Discharge: 2022-02-02 | Disposition: A | Discharge status: Home / Self Care | Payer: Medicare PPO | Source: Ambulatory Visit | Attending: Radiation Oncology | Admitting: Radiation Oncology

## 2022-02-02 DIAGNOSIS — Z510 Encounter for antineoplastic radiation therapy: Secondary | ICD-10-CM | POA: Diagnosis not present

## 2022-02-02 LAB — RAD ONC ARIA SESSION SUMMARY
Course Elapsed Days: 16
Plan Fractions Treated to Date: 12
Plan Prescribed Dose Per Fraction: 2.66 Gy
Plan Total Fractions Prescribed: 16
Plan Total Prescribed Dose: 42.56 Gy
Reference Point Dosage Given to Date: 31.92 Gy
Reference Point Session Dosage Given: 2.66 Gy
Session Number: 12

## 2022-02-03 ENCOUNTER — Other Ambulatory Visit: Payer: Self-pay

## 2022-02-03 ENCOUNTER — Ambulatory Visit
Admission: RE | Admit: 2022-02-03 | Discharge: 2022-02-03 | Disposition: A | Discharge status: Home / Self Care | Payer: Medicare PPO | Source: Ambulatory Visit | Attending: Radiation Oncology | Admitting: Radiation Oncology

## 2022-02-03 DIAGNOSIS — Z510 Encounter for antineoplastic radiation therapy: Secondary | ICD-10-CM | POA: Diagnosis not present

## 2022-02-03 LAB — RAD ONC ARIA SESSION SUMMARY
Course Elapsed Days: 17
Plan Fractions Treated to Date: 13
Plan Prescribed Dose Per Fraction: 2.66 Gy
Plan Total Fractions Prescribed: 16
Plan Total Prescribed Dose: 42.56 Gy
Reference Point Dosage Given to Date: 34.58 Gy
Reference Point Session Dosage Given: 2.66 Gy
Session Number: 13

## 2022-02-07 ENCOUNTER — Ambulatory Visit
Admission: RE | Admit: 2022-02-07 | Discharge: 2022-02-07 | Disposition: A | Discharge status: Home / Self Care | Payer: Medicare PPO | Source: Ambulatory Visit | Attending: Radiation Oncology | Admitting: Radiation Oncology

## 2022-02-07 ENCOUNTER — Other Ambulatory Visit: Payer: Self-pay

## 2022-02-07 DIAGNOSIS — Z79899 Other long term (current) drug therapy: Secondary | ICD-10-CM | POA: Insufficient documentation

## 2022-02-07 DIAGNOSIS — C50412 Malignant neoplasm of upper-outer quadrant of left female breast: Secondary | ICD-10-CM | POA: Diagnosis not present

## 2022-02-07 DIAGNOSIS — Z803 Family history of malignant neoplasm of breast: Secondary | ICD-10-CM | POA: Diagnosis not present

## 2022-02-07 DIAGNOSIS — Z86010 Personal history of colonic polyps: Secondary | ICD-10-CM | POA: Diagnosis not present

## 2022-02-07 DIAGNOSIS — E785 Hyperlipidemia, unspecified: Secondary | ICD-10-CM | POA: Diagnosis not present

## 2022-02-07 DIAGNOSIS — Z510 Encounter for antineoplastic radiation therapy: Secondary | ICD-10-CM | POA: Diagnosis present

## 2022-02-07 DIAGNOSIS — Z170 Estrogen receptor positive status [ER+]: Secondary | ICD-10-CM | POA: Diagnosis not present

## 2022-02-07 DIAGNOSIS — K219 Gastro-esophageal reflux disease without esophagitis: Secondary | ICD-10-CM | POA: Insufficient documentation

## 2022-02-07 DIAGNOSIS — I129 Hypertensive chronic kidney disease with stage 1 through stage 4 chronic kidney disease, or unspecified chronic kidney disease: Secondary | ICD-10-CM | POA: Insufficient documentation

## 2022-02-07 DIAGNOSIS — N189 Chronic kidney disease, unspecified: Secondary | ICD-10-CM | POA: Diagnosis not present

## 2022-02-07 LAB — RAD ONC ARIA SESSION SUMMARY
Course Elapsed Days: 21
Plan Fractions Treated to Date: 14
Plan Prescribed Dose Per Fraction: 2.66 Gy
Plan Total Fractions Prescribed: 16
Plan Total Prescribed Dose: 42.56 Gy
Reference Point Dosage Given to Date: 37.24 Gy
Reference Point Session Dosage Given: 2.66 Gy
Session Number: 14

## 2022-02-08 ENCOUNTER — Ambulatory Visit
Admission: RE | Admit: 2022-02-08 | Discharge: 2022-02-08 | Disposition: A | Discharge status: Home / Self Care | Payer: Medicare PPO | Source: Ambulatory Visit | Attending: Radiation Oncology | Admitting: Radiation Oncology

## 2022-02-08 ENCOUNTER — Other Ambulatory Visit: Payer: Self-pay

## 2022-02-08 DIAGNOSIS — Z510 Encounter for antineoplastic radiation therapy: Secondary | ICD-10-CM | POA: Diagnosis not present

## 2022-02-08 LAB — RAD ONC ARIA SESSION SUMMARY
Course Elapsed Days: 22
Plan Fractions Treated to Date: 15
Plan Prescribed Dose Per Fraction: 2.66 Gy
Plan Total Fractions Prescribed: 16
Plan Total Prescribed Dose: 42.56 Gy
Reference Point Dosage Given to Date: 39.9 Gy
Reference Point Session Dosage Given: 2.66 Gy
Session Number: 15

## 2022-02-09 ENCOUNTER — Other Ambulatory Visit: Payer: Self-pay

## 2022-02-09 ENCOUNTER — Ambulatory Visit
Admission: RE | Admit: 2022-02-09 | Discharge: 2022-02-09 | Disposition: A | Discharge status: Home / Self Care | Payer: Medicare PPO | Source: Ambulatory Visit | Attending: Radiation Oncology | Admitting: Radiation Oncology

## 2022-02-09 ENCOUNTER — Ambulatory Visit: Payer: Medicare PPO

## 2022-02-09 DIAGNOSIS — Z510 Encounter for antineoplastic radiation therapy: Secondary | ICD-10-CM | POA: Diagnosis not present

## 2022-02-09 LAB — RAD ONC ARIA SESSION SUMMARY
Course Elapsed Days: 23
Plan Fractions Treated to Date: 16
Plan Prescribed Dose Per Fraction: 2.66 Gy
Plan Total Fractions Prescribed: 16
Plan Total Prescribed Dose: 42.56 Gy
Reference Point Dosage Given to Date: 42.56 Gy
Reference Point Session Dosage Given: 2.66 Gy
Session Number: 16

## 2022-02-10 ENCOUNTER — Ambulatory Visit
Admission: RE | Admit: 2022-02-10 | Discharge: 2022-02-10 | Disposition: A | Discharge status: Home / Self Care | Payer: Medicare PPO | Source: Ambulatory Visit | Attending: Radiation Oncology | Admitting: Radiation Oncology

## 2022-02-10 ENCOUNTER — Other Ambulatory Visit: Payer: Self-pay

## 2022-02-10 DIAGNOSIS — Z510 Encounter for antineoplastic radiation therapy: Secondary | ICD-10-CM | POA: Diagnosis not present

## 2022-02-10 LAB — RAD ONC ARIA SESSION SUMMARY
Course Elapsed Days: 24
Plan Fractions Treated to Date: 1
Plan Name: 912
Plan Prescribed Dose Per Fraction: 2 Gy
Plan Total Fractions Prescribed: 5
Plan Total Prescribed Dose: 10 Gy
Reference Point Dosage Given to Date: 2 Gy
Reference Point Session Dosage Given: 2 Gy
Session Number: 17

## 2022-02-13 ENCOUNTER — Ambulatory Visit
Admission: RE | Admit: 2022-02-13 | Discharge: 2022-02-13 | Disposition: A | Discharge status: Home / Self Care | Payer: Medicare PPO | Source: Ambulatory Visit | Attending: Radiation Oncology | Admitting: Radiation Oncology

## 2022-02-13 ENCOUNTER — Other Ambulatory Visit: Payer: Self-pay

## 2022-02-13 DIAGNOSIS — Z510 Encounter for antineoplastic radiation therapy: Secondary | ICD-10-CM | POA: Diagnosis not present

## 2022-02-13 LAB — RAD ONC ARIA SESSION SUMMARY
Course Elapsed Days: 27
Plan Fractions Treated to Date: 2
Plan Name: 912
Plan Prescribed Dose Per Fraction: 2 Gy
Plan Total Fractions Prescribed: 5
Plan Total Prescribed Dose: 10 Gy
Reference Point Dosage Given to Date: 4 Gy
Reference Point Session Dosage Given: 2 Gy
Session Number: 18

## 2022-02-14 ENCOUNTER — Ambulatory Visit
Admission: RE | Admit: 2022-02-14 | Discharge: 2022-02-14 | Disposition: A | Discharge status: Home / Self Care | Payer: Medicare PPO | Source: Ambulatory Visit | Attending: Radiation Oncology | Admitting: Radiation Oncology

## 2022-02-14 ENCOUNTER — Other Ambulatory Visit: Payer: Self-pay

## 2022-02-14 DIAGNOSIS — Z510 Encounter for antineoplastic radiation therapy: Secondary | ICD-10-CM | POA: Diagnosis not present

## 2022-02-14 LAB — RAD ONC ARIA SESSION SUMMARY
Course Elapsed Days: 28
Plan Fractions Treated to Date: 3
Plan Name: 912
Plan Prescribed Dose Per Fraction: 2 Gy
Plan Total Fractions Prescribed: 5
Plan Total Prescribed Dose: 10 Gy
Reference Point Dosage Given to Date: 6 Gy
Reference Point Session Dosage Given: 2 Gy
Session Number: 19

## 2022-02-15 ENCOUNTER — Ambulatory Visit
Admission: RE | Admit: 2022-02-15 | Discharge: 2022-02-15 | Disposition: A | Discharge status: Home / Self Care | Payer: Medicare PPO | Source: Ambulatory Visit | Attending: Radiation Oncology | Admitting: Radiation Oncology

## 2022-02-15 ENCOUNTER — Other Ambulatory Visit: Payer: Self-pay

## 2022-02-15 DIAGNOSIS — Z510 Encounter for antineoplastic radiation therapy: Secondary | ICD-10-CM | POA: Diagnosis not present

## 2022-02-15 LAB — RAD ONC ARIA SESSION SUMMARY
Course Elapsed Days: 29
Plan Fractions Treated to Date: 4
Plan Name: 912
Plan Prescribed Dose Per Fraction: 2 Gy
Plan Total Fractions Prescribed: 5
Plan Total Prescribed Dose: 10 Gy
Reference Point Dosage Given to Date: 8 Gy
Reference Point Session Dosage Given: 2 Gy
Session Number: 20

## 2022-02-16 ENCOUNTER — Ambulatory Visit
Admission: RE | Admit: 2022-02-16 | Discharge: 2022-02-16 | Disposition: A | Discharge status: Home / Self Care | Payer: Medicare PPO | Source: Ambulatory Visit | Attending: Radiation Oncology | Admitting: Radiation Oncology

## 2022-02-16 ENCOUNTER — Inpatient Hospital Stay: Payer: Medicare PPO | Attending: Oncology | Admitting: Oncology

## 2022-02-16 ENCOUNTER — Encounter: Payer: Self-pay | Admitting: *Deleted

## 2022-02-16 ENCOUNTER — Other Ambulatory Visit: Payer: Self-pay

## 2022-02-16 VITALS — BP 143/73 | HR 74 | Temp 97.6°F | Resp 18 | Wt 114.4 lb

## 2022-02-16 DIAGNOSIS — Z79810 Long term (current) use of selective estrogen receptor modulators (SERMs): Secondary | ICD-10-CM | POA: Insufficient documentation

## 2022-02-16 DIAGNOSIS — Z923 Personal history of irradiation: Secondary | ICD-10-CM | POA: Insufficient documentation

## 2022-02-16 DIAGNOSIS — Z510 Encounter for antineoplastic radiation therapy: Secondary | ICD-10-CM | POA: Insufficient documentation

## 2022-02-16 DIAGNOSIS — M810 Age-related osteoporosis without current pathological fracture: Secondary | ICD-10-CM | POA: Diagnosis not present

## 2022-02-16 DIAGNOSIS — C50412 Malignant neoplasm of upper-outer quadrant of left female breast: Secondary | ICD-10-CM | POA: Diagnosis present

## 2022-02-16 DIAGNOSIS — I129 Hypertensive chronic kidney disease with stage 1 through stage 4 chronic kidney disease, or unspecified chronic kidney disease: Secondary | ICD-10-CM | POA: Insufficient documentation

## 2022-02-16 DIAGNOSIS — Z170 Estrogen receptor positive status [ER+]: Secondary | ICD-10-CM | POA: Diagnosis not present

## 2022-02-16 DIAGNOSIS — C50912 Malignant neoplasm of unspecified site of left female breast: Secondary | ICD-10-CM

## 2022-02-16 DIAGNOSIS — N189 Chronic kidney disease, unspecified: Secondary | ICD-10-CM | POA: Diagnosis not present

## 2022-02-16 DIAGNOSIS — Z79899 Other long term (current) drug therapy: Secondary | ICD-10-CM | POA: Insufficient documentation

## 2022-02-16 LAB — RAD ONC ARIA SESSION SUMMARY
Course Elapsed Days: 30
Plan Fractions Treated to Date: 5
Plan Name: 912
Plan Prescribed Dose Per Fraction: 2 Gy
Plan Total Fractions Prescribed: 5
Plan Total Prescribed Dose: 10 Gy
Reference Point Dosage Given to Date: 10 Gy
Reference Point Session Dosage Given: 2 Gy
Session Number: 21

## 2022-02-16 MED ORDER — TAMOXIFEN CITRATE 20 MG PO TABS: 20.0000 mg | ORAL_TABLET | Freq: Every day | ORAL | 1 refills | Status: DC
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
## 2022-02-16 NOTE — Progress Notes (Signed)
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  INTERVAL HISTORY: Patient returns to clinic today for further evaluation at the end of her XRT to initiate tamoxifen.  She was recently started on Fosamax for osteoporosis and is tolerating her treatment well.  She has some mild erythema and tenderness of her left breast, but otherwise feels well. She has no neurologic complaints.  She denies any recent fevers or illnesses.  She has a good appetite and denies weight loss.  She has no chest pain, shortness of breath, cough, or hemoptysis.  She denies any nausea, vomiting, constipation, or diarrhea.  She has no urinary complaints.  Patient offers no further specific complaints today.  REVIEW OF SYSTEMS:   Review of Systems  Constitutional: Negative.  Negative for fever, malaise/fatigue and weight loss.  Respiratory: Negative.  Negative for cough and shortness of breath.   Cardiovascular: Negative.  Negative for chest pain and leg swelling.  Gastrointestinal: Negative.  Negative for abdominal pain.  Genitourinary: Negative.  Negative for dysuria.  Musculoskeletal: Negative.  Negative for back pain.  Skin: Negative.  Negative for rash.  Neurological: Negative.  Negative for dizziness, focal weakness, weakness and headaches.  Psychiatric/Behavioral: Negative.  The patient is not nervous/anxious.     As per HPI. Otherwise, a complete review of systems is negative.  PAST MEDICAL HISTORY: Past Medical History:  Diagnosis Date   Allergic rhinitis    Arthritis     Cancer ([place])    Cervical disc disease    Chronic kidney disease    STAGE 3   GERD (gastroesophageal reflux disease)    H/O blood clots    around her 20's   History of colon polyps    Hyperlipidemia    Hypertension    PONV (postoperative nausea and vomiting)    nausea   Shingles     PAST SURGICAL HISTORY: Past Surgical History:  Procedure Laterality Date   ABDOMINAL HYSTERECTOMY     BREAST BIOPSY Left 10/26/2021   [place] Core Bx ribbon clip positive   BREAST LUMPECTOMY Left 11/11/2021   Procedure: BREAST LUMPECTOMY;  Surgeon: Robert Bellow, MD;  Location: [facility];  Service: General;  Laterality: Left;   BREAST LUMPECTOMY Left 11/11/2021   invasive lobular ca/neg margins/closest margin .3 mm   COLONOSCOPY  2013   COLONOSCOPY WITH PROPOFOL N/A 10/11/2016   Procedure: COLONOSCOPY WITH PROPOFOL;  Surgeon: Christene Lye, MD;  Location: [facility];  Service: Endoscopy;  Laterality: N/A;   ELBOW SURGERY Left    EYE SURGERY     both eyes cataract removed   FEMORAL HERNIA REPAIR Right 09/18/2015   Procedure: HERNIA REPAIR femoral  ADULT;  Surgeon: Christene Lye, MD;  Location: [facility];  Service: General;  Laterality: Right;   HERNIA REPAIR     LAPAROSCOPIC BILATERAL SALPINGO OOPHERECTOMY Bilateral 09/06/2015   Procedure: LAPAROSCOPIC BILATERAL SALPINGO OOPHORECTOMY;  Surgeon: Boykin Nearing, MD;  Location: [facility];  Service: Gynecology;  Laterality: Bilateral;   PARTIAL HYSTERECTOMY     TUBAL LIGATION     UPPER GI ENDOSCOPY     WRIST  SURGERY Right     FAMILY HISTORY: Family History  Problem Relation Age of Onset   Clotting disorder Mother    Heart failure Mother    Emphysema Father    Heart failure Father    Cancer Daughter        breast cancer   Breast cancer Daughter 71    ADVANCED DIRECTIVES (Y/N):  N  HEALTH MAINTENANCE: Social History   Tobacco Use   Smoking status: Never   Smokeless tobacco: Never  Substance Use Topics    Alcohol use: No   Drug use: No     Colonoscopy:  PAP:  Bone density:  Lipid panel:  Allergies  Allergen Reactions   Codeine Nausea Only   Gabapentin     nightmares   Lipitor [Atorvastatin]     Aches    Pravachol [Pravastatin Sodium]     aches   Tramadol     nausea   Aleve [Naproxen Sodium] Rash   Sulfa Antibiotics Itching and Rash   Sulfasalazine Itching and Rash    Current Outpatient Medications  Medication Sig Dispense Refill   acetaminophen (TYLENOL) 500 MG tablet Take 500 mg by mouth every 6 (six) hours as needed for mild pain.     alendronate (FOSAMAX) 70 MG tablet Take 1 tablet (70 mg total) by mouth once a week. Take with a full glass of water on an empty stomach. 12 tablet 3   Cyanocobalamin (VITAMIN B-12 IJ) Inject as directed every 30 (thirty) days.     fluvastatin XL (LESCOL XL) 80 MG 24 hr tablet Take 80 mg by mouth daily.  2   olmesartan-hydrochlorothiazide (BENICAR HCT) 40-12.5 MG tablet Take 1 tablet by mouth daily.     pantoprazole (PROTONIX) 40 MG tablet TAKE 1 TABLET BY MOUTH ONCE A DAY 30-60 MINUTES BEFORE FIRST MEAL OF THE DAY 90 tablet 1   No current facility-administered medications for this visit.    OBJECTIVE: Vitals:   02/16/22 1029  BP: (!) 143/73  Pulse: 74  Resp: 18  Temp: 97.6 F (36.4 C)  SpO2: 99%     Body mass index is 20.92 kg/m.    ECOG FS:0 - Asymptomatic  General: Well-developed, well-nourished, no acute distress. Eyes: Pink conjunctiva, anicteric sclera. HEENT: Normocephalic, moist mucous membranes. Breast: Left breast erythema. Lungs: No audible wheezing or coughing. Heart: Regular rate and rhythm. Abdomen: Soft, nontender, no obvious distention. Musculoskeletal: No edema, cyanosis, or clubbing. Neuro: Alert, answering all questions appropriately. Cranial nerves grossly intact. Skin: No rashes or petechiae noted. Psych: Normal affect.   LAB RESULTS:  Lab Results  Component Value Date   NA 140 11/07/2021   K 4.0  11/07/2021   CL 111 11/07/2021   CO2 20 (L) 11/07/2021   GLUCOSE 102 (H) 11/07/2021   BUN 26 (H) 11/07/2021   CREATININE 1.63 (H) 11/07/2021   CALCIUM 9.1 11/07/2021   PROT 7.9 09/18/2015   ALBUMIN 4.0 09/18/2015   AST 19 09/18/2015   ALT 12 (L) 09/18/2015   ALKPHOS 80 09/18/2015   BILITOT 1.1 09/18/2015   GFRNONAA 32 (L) 11/07/2021   GFRAA 48 (L) 09/18/2015    Lab Results  Component Value Date   WBC 8.7 01/23/2022   NEUTROABS 6.1 10/10/2012   HGB 11.0 (L) 01/23/2022   HCT 33.8 (L) 01/23/2022   MCV 90.1 01/23/2022   PLT 241 01/23/2022     STUDIES: No results found.  ASSESSMENT: Stage Ia ER/PR positive, HER2 negative invasive carcinoma of the upper outer quadrant  of the left breast.  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  MRI of the breast completed on November 04, 2021 did not reveal any additional malignancy.  Given the small size of the mass, she is considered a T1a therefore Oncotype testing nor chemotherapy was necessary.  Patient has now completed XRT.  Given her osteoporosis, patient was prescribed tamoxifen which she will take for a total of 5 years completing treatment in January 2029.  No further intervention is needed.  Return to clinic in 3 months for routine evaluation. Osteoporosis: Bone mineral density completed on January 06, 2022 revealed a T-score of -3.5.  Patient was initiated on Fosamax and instructed to continue taking calcium and vitamin D supplementation.  Tamoxifen as above.  Repeat bone mineral density in December 2024. Hypertension: Patient's blood pressure is moderately elevated today.  Continue monitoring and treatment per primary care.   Patient expressed understanding and was in agreement with this plan. She also understands that She can call clinic at any time with any questions, concerns, or complaints.    Cancer  Staging  Invasive ductal carcinoma of left breast [facility]) Staging form: Breast, AJCC 8th Edition - Clinical stage from 11/04/2021: Stage IA (cT1a, cN0, cM0, G2, ER+, PR+, HER2-) - Signed by Lloyd Huger, MD on 11/04/2021 Stage prefix: Initial diagnosis Histologic grading system: 3 grade system  Lloyd Huger, MD   02/16/2022 11:16 AM

## 2022-02-20 ENCOUNTER — Ambulatory Visit: Payer: Medicare PPO | Admitting: Oncology

## 2022-03-27 ENCOUNTER — Encounter: Payer: Self-pay | Admitting: Radiation Oncology

## 2022-03-27 ENCOUNTER — Ambulatory Visit
Admission: RE | Admit: 2022-03-27 | Discharge: 2022-03-27 | Disposition: A | Discharge status: Home / Self Care | Payer: Medicare PPO | Source: Ambulatory Visit | Attending: Radiation Oncology | Admitting: Radiation Oncology

## 2022-03-27 VITALS — BP 150/72 | HR 70 | Temp 97.2°F | Resp 16 | Ht 62.0 in | Wt 115.4 lb

## 2022-03-27 DIAGNOSIS — C50912 Malignant neoplasm of unspecified site of left female breast: Secondary | ICD-10-CM

## 2022-03-27 NOTE — Progress Notes (Signed)
Radiation Oncology Follow up Note  Name: Jane Griffin   Date:   03/27/2022 MRN:  [id number] DOB: [birth year]    This 82 y.o. 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  Seen today in follow-up she is doing well she has a small keratosis of her left breast which has gotten somewhat more hyperpigmented she has been scratching it I have assured her this is benign and to "leave it alone.  She otherwise specifically denies breast tenderness cough or bone pain..  She is currently on tamoxifen tolerating it well without side effect.  COMPLICATIONS OF TREATMENT: none  FOLLOW UP COMPLIANCE: keeps appointments   PHYSICAL EXAM:  BP (!) 150/72   Pulse 70   Temp (!) 97.2 F (36.2 C)   Resp 16   Ht 5' 2"$  (1.575 m)   Wt 115 lb 6.4 oz (52.3 kg)   BMI 21.11 kg/m  Lungs are clear to A&P cardiac examination essentially unremarkable with regular rate and rhythm. No dominant mass or nodularity is noted in either breast in 2 positions examined. Incision is well-healed. No axillary or supraclavicular adenopathy is appreciated. Cosmetic result is excellent.  Well-developed well-nourished patient in NAD. HEENT reveals PERLA, EOMI, discs not visualized.  Oral cavity is clear. No oral mucosal lesions are identified. Neck is clear without evidence of cervical or supraclavicular adenopathy. Lungs are clear to A&P. Cardiac examination is essentially unremarkable with regular rate and rhythm without murmur rub or thrill. Abdomen is benign with no organomegaly or masses noted. Motor sensory and DTR levels are equal and symmetric in the upper and lower extremities. Cranial nerves II  through XII are grossly intact. Proprioception is intact. No peripheral adenopathy or edema is identified. No motor or sensory levels are noted. Crude visual fields are within normal range.  RADIOLOGY RESULTS: No current films for review  PLAN: Present time patient is doing well very low side effect profile from her previous whole breast radiation have asked to see her back in 6 months for follow-up.  She is seeing her dermatologist next month of asked her to show the cysts pigmented lesion on her breast to the dermatologist at that time.  Otherwise patient is to call with any concerns.  She continues on tamoxifen without side effect.  I would like to take this opportunity to thank you for allowing me to participate in the care of your patient.Noreene Filbert, MD

## 2022-05-18 ENCOUNTER — Inpatient Hospital Stay: Payer: Medicare PPO | Attending: Oncology | Admitting: Oncology

## 2022-05-18 ENCOUNTER — Encounter: Payer: Self-pay | Admitting: Oncology

## 2022-05-18 VITALS — BP 136/64 | HR 70 | Temp 97.1°F | Resp 16 | Ht 62.0 in | Wt 113.6 lb

## 2022-05-18 DIAGNOSIS — C50412 Malignant neoplasm of upper-outer quadrant of left female breast: Secondary | ICD-10-CM | POA: Insufficient documentation

## 2022-05-18 DIAGNOSIS — Z923 Personal history of irradiation: Secondary | ICD-10-CM | POA: Diagnosis not present

## 2022-05-18 DIAGNOSIS — C50912 Malignant neoplasm of unspecified site of left female breast: Secondary | ICD-10-CM | POA: Diagnosis not present

## 2022-05-18 DIAGNOSIS — Z79899 Other long term (current) drug therapy: Secondary | ICD-10-CM | POA: Insufficient documentation

## 2022-05-18 DIAGNOSIS — Z803 Family history of malignant neoplasm of breast: Secondary | ICD-10-CM | POA: Diagnosis not present

## 2022-05-18 DIAGNOSIS — Z79810 Long term (current) use of selective estrogen receptor modulators (SERMs): Secondary | ICD-10-CM | POA: Insufficient documentation

## 2022-05-18 DIAGNOSIS — Z170 Estrogen receptor positive status [ER+]: Secondary | ICD-10-CM | POA: Diagnosis not present

## 2022-05-18 NOTE — Progress Notes (Signed)
[facility]  [phone] [phone]) [phone]  ID: HEMMA CANEL OB: 05-02-1940  MR#: [id number]  CLE#:[id number]  Patient Care Team: Lauro Regulus, MD as PCP - General (Unknown Physician Specialty) Lauro Regulus, MD (Internal Medicine) Lemar Livings, Merrily Pew, MD (General Surgery) Hulen Luster, RN as Oncology Nurse Navigator  CHIEF COMPLAINT: Stage Ia ER/PR positive, HER2 negative invasive carcinoma of the upper outer quadrant of the left breast.  INTERVAL HISTORY: Patient returns to clinic today for routine 18-month evaluation.  She is tolerating tamoxifen and Fosamax well without significant side effects.  She currently feels well and is asymptomatic. She has no neurologic complaints.  She denies any recent fevers or illnesses.  She has a good appetite and denies weight loss.  She has no chest pain, shortness of breath, cough, or hemoptysis.  She denies any nausea, vomiting, constipation, or diarrhea.  She has no urinary complaints.  Patient offers no specific complaints today.  REVIEW OF SYSTEMS:   Review of Systems  Constitutional: Negative.  Negative for fever, malaise/fatigue and weight loss.  Respiratory: Negative.  Negative for cough and shortness of breath.   Cardiovascular: Negative.  Negative for chest pain and leg swelling.  Gastrointestinal: Negative.  Negative for abdominal pain.  Genitourinary: Negative.  Negative for dysuria.  Musculoskeletal: Negative.  Negative for back pain.  Skin: Negative.  Negative for rash.  Neurological: Negative.  Negative for dizziness, focal weakness, weakness and headaches.  Psychiatric/Behavioral: Negative.  The patient is not nervous/anxious.     As per HPI. Otherwise, a complete review of systems is negative.  PAST MEDICAL HISTORY: Past Medical History:  Diagnosis Date   Allergic rhinitis    Arthritis    Cancer    Cervical disc disease    Chronic kidney disease    STAGE 3   GERD  (gastroesophageal reflux disease)    H/O blood clots    around her 20's   History of colon polyps    Hyperlipidemia    Hypertension    PONV (postoperative nausea and vomiting)    nausea   Shingles     PAST SURGICAL HISTORY: Past Surgical History:  Procedure Laterality Date   ABDOMINAL HYSTERECTOMY     BREAST BIOPSY Left 10/26/2021   [place] Core Bx ribbon clip positive   BREAST LUMPECTOMY Left 11/11/2021   Procedure: BREAST LUMPECTOMY;  Surgeon: Earline Mayotte, MD;  Location: [facility] ORS;  Service: General;  Laterality: Left;   BREAST LUMPECTOMY Left 11/11/2021   invasive lobular ca/neg margins/closest margin .3 mm   COLONOSCOPY  2013   COLONOSCOPY WITH PROPOFOL N/A 10/11/2016   Procedure: COLONOSCOPY WITH PROPOFOL;  Surgeon: Kieth Brightly, MD;  Location: [facility] END[facility]COPY;  Service: Endoscopy;  Laterality: N/A;   ELBOW SURGERY Left    EYE SURGERY     both eyes cataract removed   FEMORAL HERNIA REPAIR Right 09/18/2015   Procedure: HERNIA REPAIR femoral  ADULT;  Surgeon: Kieth Brightly, MD;  Location: [facility] ORS;  Service: General;  Laterality: Right;   HERNIA REPAIR     LAPAROSCOPIC BILATERAL SALPINGO OOPHERECTOMY Bilateral 09/06/2015   Procedure: LAPAROSCOPIC BILATERAL SALPINGO OOPHORECTOMY;  Surgeon: Suzy Bouchard, MD;  Location: [facility] ORS;  Service: Gynecology;  Laterality: Bilateral;   PARTIAL HYSTERECTOMY     TUBAL LIGATION     UPPER GI ENDOSCOPY     WRIST SURGERY Right     FAMILY HISTORY: Family History  Problem Relation Age of Onset   Clotting disorder Mother  Heart failure Mother    Emphysema Father    Heart failure Father    Cancer Daughter        breast cancer   Breast cancer Daughter 71    ADVANCED DIRECTIVES (Y/N):  N  HEALTH MAINTENANCE: Social History   Tobacco Use   Smoking status: Never   Smokeless tobacco: Never  Substance Use Topics   Alcohol use: No   Drug use: No     Colonoscopy:  PAP:  Bone density:  Lipid  panel:  Allergies  Allergen Reactions   Codeine Nausea Only   Gabapentin     nightmares   Lipitor [Atorvastatin]     Aches    Pravachol [Pravastatin Sodium]     aches   Tramadol     nausea   Aleve [Naproxen Sodium] Rash   Sulfa Antibiotics Itching and Rash   Sulfasalazine Itching and Rash    Current Outpatient Medications  Medication Sig Dispense Refill   acetaminophen (TYLENOL) 500 MG tablet Take 500 mg by mouth every 6 (six) hours as needed for mild pain.     alendronate (FOSAMAX) 70 MG tablet Take 1 tablet (70 mg total) by mouth once a week. Take with a full glass of water on an empty stomach. 12 tablet 3   Cyanocobalamin (VITAMIN B-12 IJ) Inject as directed every 30 (thirty) days.     fluvastatin XL (LESCOL XL) 80 MG 24 hr tablet Take 80 mg by mouth daily.  2   olmesartan-hydrochlorothiazide (BENICAR HCT) 40-12.5 MG tablet Take 1 tablet by mouth daily.     pantoprazole (PROTONIX) 40 MG tablet TAKE 1 TABLET BY MOUTH ONCE A DAY 30-60 MINUTES BEFORE FIRST MEAL OF THE DAY 90 tablet 1   tamoxifen (NOLVADEX) 20 MG tablet Take 1 tablet (20 mg total) by mouth daily. 90 tablet 1   No current facility-administered medications for this visit.    OBJECTIVE: Vitals:   05/18/22 0953  BP: 136/64  Pulse: 70  Resp: 16  Temp: (!) 97.1 F (36.2 C)  SpO2: 100%     Body mass index is 20.78 kg/m.    ECOG FS:0 - Asymptomatic  General: Well-developed, well-nourished, no acute distress. Eyes: Pink conjunctiva, anicteric sclera. HEENT: Normocephalic, moist mucous membranes. Lungs: No audible wheezing or coughing. Heart: Regular rate and rhythm. Abdomen: Soft, nontender, no obvious distention. Musculoskeletal: No edema, cyanosis, or clubbing. Neuro: Alert, answering all questions appropriately. Cranial nerves grossly intact. Skin: No rashes or petechiae noted. Psych: Normal affect.  LAB RESULTS:  Lab Results  Component Value Date   NA 140 11/07/2021   K 4.0 11/07/2021   CL 111  11/07/2021   CO2 20 (L) 11/07/2021   GLUCOSE 102 (H) 11/07/2021   BUN 26 (H) 11/07/2021   CREATININE 1.63 (H) 11/07/2021   CALCIUM 9.1 11/07/2021   PROT 7.9 09/18/2015   ALBUMIN 4.0 09/18/2015   AST 19 09/18/2015   ALT 12 (L) 09/18/2015   ALKPHOS 80 09/18/2015   BILITOT 1.1 09/18/2015   GFRNONAA 32 (L) 11/07/2021   GFRAA 48 (L) 09/18/2015    Lab Results  Component Value Date   WBC 8.7 01/23/2022   NEUTROABS 6.1 10/10/2012   HGB 11.0 (L) 01/23/2022   HCT 33.8 (L) 01/23/2022   MCV 90.1 01/23/2022   PLT 241 01/23/2022     STUDIES: No results found.  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  PLAN:    Stage  Ia ER/PR positive, HER2 negative invasive carcinoma of the upper outer quadrant of the left breast: Imaging and pathology results reviewed independently.  MRI of the breast completed on November 04, 2021 did not reveal any additional malignancy.  Given the small size of the mass, she is considered a T1a therefore Oncotype testing nor chemotherapy was necessary.  Patient has now completed XRT.  Given her osteoporosis, patient was prescribed tamoxifen which she will take for a total of 5 years completing treatment in January 2029.  No further intervention is.  Return to clinic in 6 months for routine evaluation. Osteoporosis: Bone mineral density completed on January 06, 2022 revealed a T-score of -3.5.  Continue Fosamax, calcium, and vitamin D.  Tamoxifen as above.  Repeat bone mineral density in December 2024. Hypertension: Patient's blood pressure is within normal limits today.  Continue monitoring and treatment per primary care.  I spent a total of 20 minutes reviewing chart data, face-to-face evaluation with the patient, counseling and coordination of care as detailed above.    Patient expressed understanding and was in agreement with this plan. She also understands that She can call clinic at any  time with any questions, concerns, or complaints.    Cancer Staging  Invasive ductal carcinoma of left breast Staging form: Breast, AJCC 8th Edition - Clinical stage from 11/04/2021: Stage IA (cT1a, cN0, cM0, G2, ER+, PR+, HER2-) - Signed by Jeralyn RuthsFinnegan, Sarai January J, MD on 11/04/2021 Stage prefix: Initial diagnosis Histologic grading system: 3 grade system  Jeralyn Ruthsimothy J Maykayla Highley, MD   05/18/2022 10:17 AM

## 2022-07-14 ENCOUNTER — Other Ambulatory Visit
Admission: RE | Admit: 2022-07-14 | Discharge: 2022-07-14 | Disposition: A | Discharge status: Home / Self Care | Payer: Medicare PPO | Source: Ambulatory Visit | Attending: Infectious Diseases | Admitting: Infectious Diseases

## 2022-07-14 DIAGNOSIS — M79644 Pain in right finger(s): Secondary | ICD-10-CM | POA: Insufficient documentation

## 2022-07-14 LAB — SYNOVIAL FLUID, CRYSTAL

## 2022-07-25 ENCOUNTER — Other Ambulatory Visit: Payer: Self-pay | Admitting: Orthopedic Surgery

## 2022-08-01 ENCOUNTER — Other Ambulatory Visit: Payer: Self-pay

## 2022-08-01 ENCOUNTER — Encounter: Payer: Self-pay | Admitting: Orthopedic Surgery

## 2022-08-01 NOTE — Anesthesia Preprocedure Evaluation (Addendum)
Anesthesia Evaluation  Patient identified by MRN, date of birth, ID band Patient awake    Reviewed: Allergy & Precautions, H&P , NPO status , Patient's Chart, lab work & pertinent test results  History of Anesthesia Complications (+) PONV and history of anesthetic complications  Airway Mallampati: III  TM Distance: >3 FB Neck ROM: Full    Dental no notable dental hx.    Pulmonary neg pulmonary ROS   Pulmonary exam normal breath sounds clear to auscultation       Cardiovascular hypertension, Normal cardiovascular exam Rhythm:Regular Rate:Normal     Neuro/Psych negative neurological ROS  negative psych ROS   GI/Hepatic Neg liver ROS,GERD  ,,  Endo/Other  negative endocrine ROS    Renal/GU Renal disease  negative genitourinary   Musculoskeletal  (+) Arthritis ,    Abdominal   Peds negative pediatric ROS (+)  Hematology negative hematology ROS (+)   Anesthesia Other Findings Hypertension  H/O blood clots Allergic rhinitis Hyperlipidemia GERD (gastroesophageal reflux disease) Shingles Cervical disc disease  PONV (postoperative nausea and vomiting) Chronic kidney disease  History of colon polyps Arthritis  Invasive Ductal carcinoma left breast   LMA, note PONV    Reproductive/Obstetrics negative OB ROS                             Anesthesia Physical Anesthesia Plan  ASA: 3  Anesthesia Plan: General   Post-op Pain Management:    Induction: Intravenous  PONV Risk Score and Plan:   Airway Management Planned: LMA  Additional Equipment:   Intra-op Plan:   Post-operative Plan: Extubation in OR  Informed Consent: I have reviewed the patients History and Physical, chart, labs and discussed the procedure including the risks, benefits and alternatives for the proposed anesthesia with the patient or authorized representative who has indicated his/her understanding and  acceptance.     Dental Advisory Given  Plan Discussed with: Anesthesiologist, CRNA and Surgeon  Anesthesia Plan Comments: (Patient consented for risks of anesthesia including but not limited to:  - adverse reactions to medications - damage to eyes, teeth, lips or other oral mucosa - nerve damage due to positioning  - sore throat or hoarseness - Damage to heart, brain, nerves, lungs, other parts of body or loss of life  Patient voiced understanding.)       Anesthesia Quick Evaluation

## 2022-08-03 ENCOUNTER — Encounter
Admission: RE | Admit: 2022-08-03 | Discharge: 2022-08-03 | Disposition: A | Discharge status: Home / Self Care | Payer: Medicare PPO | Source: Ambulatory Visit | Attending: Urgent Care | Admitting: Urgent Care

## 2022-08-03 DIAGNOSIS — Z79899 Other long term (current) drug therapy: Secondary | ICD-10-CM | POA: Diagnosis not present

## 2022-08-03 DIAGNOSIS — Z01812 Encounter for preprocedural laboratory examination: Secondary | ICD-10-CM | POA: Diagnosis present

## 2022-08-03 LAB — POTASSIUM: Potassium: 4.6 mmol/L (ref 3.5–5.1)

## 2022-08-08 ENCOUNTER — Ambulatory Visit: Payer: Medicare PPO | Admitting: Anesthesiology

## 2022-08-08 ENCOUNTER — Other Ambulatory Visit: Payer: Self-pay

## 2022-08-08 ENCOUNTER — Ambulatory Visit
Admission: RE | Admit: 2022-08-08 | Discharge: 2022-08-08 | Disposition: A | Discharge status: Home / Self Care | Payer: Medicare PPO | Attending: Orthopedic Surgery | Admitting: Orthopedic Surgery

## 2022-08-08 ENCOUNTER — Ambulatory Visit: Payer: Self-pay

## 2022-08-08 ENCOUNTER — Encounter: Payer: Self-pay | Admitting: Orthopedic Surgery

## 2022-08-08 ENCOUNTER — Encounter
Admission: RE | Disposition: A | Discharge status: Home / Self Care | Payer: Self-pay | Source: Home / Self Care | Attending: Orthopedic Surgery

## 2022-08-08 DIAGNOSIS — N189 Chronic kidney disease, unspecified: Secondary | ICD-10-CM | POA: Diagnosis not present

## 2022-08-08 DIAGNOSIS — E785 Hyperlipidemia, unspecified: Secondary | ICD-10-CM | POA: Insufficient documentation

## 2022-08-08 DIAGNOSIS — Z853 Personal history of malignant neoplasm of breast: Secondary | ICD-10-CM | POA: Insufficient documentation

## 2022-08-08 DIAGNOSIS — K219 Gastro-esophageal reflux disease without esophagitis: Secondary | ICD-10-CM | POA: Diagnosis not present

## 2022-08-08 DIAGNOSIS — M1990 Unspecified osteoarthritis, unspecified site: Secondary | ICD-10-CM | POA: Diagnosis not present

## 2022-08-08 DIAGNOSIS — Z01812 Encounter for preprocedural laboratory examination: Secondary | ICD-10-CM

## 2022-08-08 DIAGNOSIS — I700 Atherosclerosis of aorta: Secondary | ICD-10-CM | POA: Insufficient documentation

## 2022-08-08 DIAGNOSIS — I129 Hypertensive chronic kidney disease with stage 1 through stage 4 chronic kidney disease, or unspecified chronic kidney disease: Secondary | ICD-10-CM | POA: Insufficient documentation

## 2022-08-08 DIAGNOSIS — J309 Allergic rhinitis, unspecified: Secondary | ICD-10-CM | POA: Insufficient documentation

## 2022-08-08 DIAGNOSIS — M19241 Secondary osteoarthritis, right hand: Secondary | ICD-10-CM | POA: Insufficient documentation

## 2022-08-08 DIAGNOSIS — Z79899 Other long term (current) drug therapy: Secondary | ICD-10-CM

## 2022-08-08 HISTORY — DX: Personal history of other diseases of the respiratory system: Z87.09

## 2022-08-08 HISTORY — PX: DISTAL INTERPHALANGEAL JOINT FUSION: SHX6428

## 2022-08-08 HISTORY — DX: Personal history of other diseases of the musculoskeletal system and connective tissue: Z87.39

## 2022-08-08 SURGERY — DISTAL INTERPHALANGEAL JOINT FUSION
Anesthesia: General | Site: Hand | Laterality: Right

## 2022-08-08 MED ORDER — ACETAMINOPHEN 325 MG PO TABS: 325.0000 mg | ORAL_TABLET | Freq: Four times a day (QID) | ORAL | Status: DC | PRN

## 2022-08-08 MED ORDER — LACTATED RINGERS IV SOLN: INTRAVENOUS | Status: DC

## 2022-08-08 MED ORDER — HYDROCODONE-ACETAMINOPHEN 5-325 MG PO TABS: 1.0000 | ORAL_TABLET | ORAL | Status: DC | PRN

## 2022-08-08 MED ORDER — ACETAMINOPHEN 10 MG/ML IV SOLN
INTRAVENOUS | Status: DC | PRN
  Administered 2022-08-08: 750 mg via INTRAVENOUS

## 2022-08-08 MED ORDER — CEFAZOLIN SODIUM-DEXTROSE 2-4 GM/100ML-% IV SOLN
2.0000 g | INTRAVENOUS | Status: AC
  Administered 2022-08-08: 2 g via INTRAVENOUS

## 2022-08-08 MED ORDER — PHENYLEPHRINE HCL (PRESSORS) 10 MG/ML IV SOLN
INTRAVENOUS | Status: DC | PRN
  Administered 2022-08-08 (×2): 100 ug via INTRAVENOUS
  Administered 2022-08-08: 50 ug via INTRAVENOUS
  Administered 2022-08-08 (×3): 100 ug via INTRAVENOUS
  Administered 2022-08-08: 50 ug via INTRAVENOUS
  Administered 2022-08-08: 100 ug via INTRAVENOUS

## 2022-08-08 MED ORDER — EPHEDRINE SULFATE (PRESSORS) 50 MG/ML IJ SOLN
INTRAMUSCULAR | Status: DC | PRN
  Administered 2022-08-08 (×2): 5 mg via INTRAVENOUS

## 2022-08-08 MED ORDER — LIDOCAINE HCL (CARDIAC) PF 100 MG/5ML IV SOSY
PREFILLED_SYRINGE | INTRAVENOUS | Status: DC | PRN
  Administered 2022-08-08: 100 mg via INTRATRACHEAL

## 2022-08-08 MED ORDER — FENTANYL CITRATE (PF) 100 MCG/2ML IJ SOLN
INTRAMUSCULAR | Status: DC | PRN
  Administered 2022-08-08: 100 ug via INTRAVENOUS

## 2022-08-08 MED ORDER — SODIUM CHLORIDE 0.9 % IV SOLN: INTRAVENOUS | Status: DC

## 2022-08-08 MED ORDER — BUPIVACAINE HCL 0.5 % IJ SOLN
INTRAMUSCULAR | Status: DC | PRN
  Administered 2022-08-08: 10 mL

## 2022-08-08 MED ORDER — MORPHINE SULFATE (PF) 2 MG/ML IV SOLN: 0.5000 mg | INTRAVENOUS | Status: DC | PRN

## 2022-08-08 MED ORDER — MIDAZOLAM HCL 2 MG/2ML IJ SOLN
2.0000 mg | Freq: Once | INTRAMUSCULAR | Status: AC
  Administered 2022-08-08: 2 mg via INTRAVENOUS

## 2022-08-08 MED ORDER — HYDROCODONE-ACETAMINOPHEN 7.5-325 MG PO TABS: 1.0000 | ORAL_TABLET | ORAL | Status: DC | PRN

## 2022-08-08 MED ORDER — PROPOFOL 10 MG/ML IV BOLUS
INTRAVENOUS | Status: DC | PRN
  Administered 2022-08-08: 150 mg via INTRAVENOUS

## 2022-08-08 MED ORDER — DEXAMETHASONE SODIUM PHOSPHATE 4 MG/ML IJ SOLN
INTRAMUSCULAR | Status: DC | PRN
  Administered 2022-08-08: 8 mg via INTRAVENOUS

## 2022-08-08 MED ORDER — ONDANSETRON HCL 4 MG PO TABS: 4.0000 mg | ORAL_TABLET | Freq: Four times a day (QID) | ORAL | Status: DC | PRN

## 2022-08-08 MED ORDER — ONDANSETRON HCL 4 MG/2ML IJ SOLN: 4.0000 mg | Freq: Four times a day (QID) | INTRAMUSCULAR | Status: DC | PRN

## 2022-08-08 MED ORDER — HYDROCODONE-ACETAMINOPHEN 5-325 MG PO TABS: 1.0000 | ORAL_TABLET | ORAL | 0 refills | Status: DC | PRN

## 2022-08-08 MED ORDER — OXYCODONE HCL 5 MG PO TABS
5.0000 mg | ORAL_TABLET | Freq: Once | ORAL | Status: AC
  Administered 2022-08-08: 5 mg via ORAL

## 2022-08-08 MED ORDER — ONDANSETRON HCL 4 MG/2ML IJ SOLN
INTRAMUSCULAR | Status: DC | PRN
  Administered 2022-08-08: 4 mg via INTRAVENOUS

## 2022-08-08 MED ORDER — PROPOFOL 500 MG/50ML IV EMUL
INTRAVENOUS | Status: DC | PRN
  Administered 2022-08-08: 125 ug/kg/min via INTRAVENOUS

## 2022-08-08 MED ORDER — 0.9 % SODIUM CHLORIDE (POUR BTL) OPTIME
TOPICAL | Status: DC | PRN
  Administered 2022-08-08: 500 mL

## 2022-08-08 SURGICAL SUPPLY — 26 items
APL PRP STRL LF DISP 70% ISPRP (MISCELLANEOUS) ×1
BNDG CMPR 75X21 PLY HI ABS (MISCELLANEOUS) ×1
BNDG CMPR STD VLCR NS LF 5.8X3 (GAUZE/BANDAGES/DRESSINGS) ×1
BNDG CMPR STD VLCR NS LF 5.8X4 (GAUZE/BANDAGES/DRESSINGS) ×1
BNDG ELASTIC 3X5.8 VLCR NS LF (GAUZE/BANDAGES/DRESSINGS) ×1 IMPLANT
BNDG ELASTIC 4X5.8 VLCR NS LF (GAUZE/BANDAGES/DRESSINGS) ×1 IMPLANT
CHLORAPREP W/TINT 26 (MISCELLANEOUS) IMPLANT
CUFF TOURN SGL QUICK 18X4 (TOURNIQUET CUFF) ×1 IMPLANT
DRAPE FLUOR MINI C-ARM 54X84 (DRAPES) ×1 IMPLANT
DRILL NANO ACTRK 3 (DRILL) IMPLANT
ELECT REM PT RETURN 9FT ADLT (ELECTROSURGICAL) ×1
ELECTRODE REM PT RTRN 9FT ADLT (ELECTROSURGICAL) ×1 IMPLANT
GAUZE SPONGE 4X4 12PLY STRL (GAUZE/BANDAGES/DRESSINGS) ×1 IMPLANT
GAUZE STRETCH 2X75IN STRL (MISCELLANEOUS) IMPLANT
GAUZE XEROFORM 1X8 LF (GAUZE/BANDAGES/DRESSINGS) ×1 IMPLANT
GLOVE SURG SYN 9.0 PF PI (GLOVE) ×1 IMPLANT
GOWN STRL REIN 2XL XLG LVL4 (GOWN DISPOSABLE) ×1 IMPLANT
GOWN STRL REUS W/ TWL LRG LVL3 (GOWN DISPOSABLE) ×1 IMPLANT
GOWN STRL REUS W/TWL LRG LVL3 (GOWN DISPOSABLE) ×1
GUIDEWIRE DBL END .7 (WIRE) IMPLANT
KIT TURNOVER KIT A (KITS) ×1 IMPLANT
NDL FILTER BLUNT 18X1 1/2 (NEEDLE) ×1 IMPLANT
NEEDLE FILTER BLUNT 18X1 1/2 (NEEDLE) ×1 IMPLANT
PACK EXTREMITY ARMC (MISCELLANEOUS) ×1 IMPLANT
SCREW ACUTRAK NANO 3X28 (Screw) IMPLANT
SUT ETHILON 4 0 PS 2 18 (SUTURE) IMPLANT

## 2022-08-08 NOTE — H&P (Signed)
Chief Complaint Patient presents with Right Hand - Edema   History of the Present Illness: Jane Griffin is a 82 y.o. female who was referred by Dr. Einar Crow today for hand pain. She has had prior x-ray done here in the clinic. Dr. Sampson Goon got an x-ray on 07/14/2022 of the index finger.  The patient is experiencing significant discomfort in her right middle finger. She denies a history of gout. She has been on two different courses of antibiotics and was placed on clindamycin today. She reports experiencing gastrointestinal upset. She reports that her finger has been red for 3 weeks. She states she and her physician have drained some white fluid out from her finger. This weekend, she broke out in a rash in her hands while on her second antibiotic, which she describes as burning. She used cold packs, milk, and aspirin. Dr. Sampson Goon put her on colchicine and prednisone. She has been on prednisone for 2 weeks. She reports that it was improving when it was draining.  I have reviewed past medical, surgical, social and family history, and allergies as documented in the EMR.  Past Medical History: Past Medical History: Diagnosis Date Allergic rhinitis Asthma (HHS-HCC) 02/07/2008 in records and suggested by pulmonary consult 2010 Breast cancer (CMS/HHS-HCC) 11/11/2021 5 mm invasive lobular carcinoma, ER+ Cervical disc disease Chronic back pain Chronic cough Chronic kidney disease GERD (gastroesophageal reflux disease) History of blood clots in her 20's History of colon polyps HTN (hypertension) Hyperlipidemia Phlebitis PONV (postoperative nausea and vomiting) Shingles  Past Surgical History: Past Surgical History: Procedure Laterality Date HYSTERECTOMY 1983 for dysmenorrhea. Ovaries remain posterior colporrhaphy with enterocele ligation 2007 UPPER GASTROINTESTINAL ENDOSCOPY 2010 normal Laparoscopic BSO and LOA 08/19/2015 Repair of incarcerated right femoral hernia  09/18/2015 Dr Evette Cristal COLONOSCOPY 10/11/2016 ultrasound guided breast biopsy Left 10/26/2021 MASTECTOMY PARTIAL / LUMPECTOMY Left 11/11/2021 COLONOSCOPY 01/25/2011 Dr Dareen Piano normal, in past adenomatous polyps ELBOW SURGERY left Hernia surgery repair LAPAROSCOPIC TUBAL LIGATION Right wrist hardware placed UPPER GASTROINTESTINAL ENDOSCOPY [facility]ravo 2004 cough and reflux good correlation by history from recods  Past Family History: Family History Problem Relation Age of Onset Heart failure Mother Pulmonary embolism Mother High blood pressure (Hypertension) Mother Diabetes Mother Heart failure Father Myocardial Infarction (Heart attack) Father High blood pressure (Hypertension) Father Emphysema Father Breast cancer Daughter 73 Multiple sclerosis Daughter Colon cancer Neg Hx  Medications: Current Outpatient Medications Medication Sig Dispense Refill acetaminophen (TYLENOL) 500 MG tablet Take 500 mg by mouth every 8 (eight) hours alendronate (FOSAMAX) 70 MG tablet Take 70 mg by mouth every 7 (seven) days clindamycin (CLEOCIN) 300 MG capsule Take 1 capsule (300 mg total) by mouth 3 (three) times daily for 10 days 30 capsule 0 colchicine (COLCRYS) 0.6 mg tablet Take 2 tablets (1.2 mg) at onset of gout flare. Take 1 additional tablet (0.6 mg) one hour later if symptoms persist. Maximum dose is 1.8 mg (3 tablets) in 24 hours. Then take one tablet (0.6 mg) daily until symptoms resolve. 12 tablet 0 fluvastatin XL (LESCOL XL) 80 mg XL tablet TAKE 1 TABLET BY MOUTH EVERY DAY 90 tablet 3 olmesartan-hydroCHLOROthiazide (BENICAR HCT) 40-12.5 mg tablet TAKE 1 TABLET BY MOUTH EVERY DAY 90 tablet 3 pantoprazole (PROTONIX) 40 MG DR tablet Take 1 tablet (40 mg total) by mouth once daily 90 tablet 1 predniSONE (DELTASONE) 20 MG tablet Prednisone 20 mg; 3 a day for 3 days then 2 a day for 3 days then 1 a day for 3 days 18 tablet 0 tamoxifen (NOLVADEX) 20 MG tablet  Take 20 mg by mouth once  daily triamcinolone (NASACORT AQ) 55 mcg nasal spray Place 2 sprays into both nostrils once daily 10 mL 2  Current Facility-Administered Medications Medication Dose Route Frequency Provider Last Rate Last Admin cyanocobalamin (VITAMIN B12) injection 1,000 mcg 1,000 mcg Intramuscular Q30 Days Cathrine Muster III, MD 1,000 mcg at 07/24/22 1107  Allergies: Allergies Allergen Reactions Advil [Ibuprofen] Rash Codeine Nausea Gabapentin Other (See Comments) nightmares  Lescol [Fluvastatin] Unknown Lipitor [Atorvastatin] Muscle Pain Pravachol [Pravastatin] Muscle Pain Sulfa (Sulfonamide Antibiotics) Nausea Tramadol Nausea Aleve [Naproxen Sodium] Rash Sulfasalazine Itching and Rash   Body mass index is 21.48 kg/m.  Review of Systems: A comprehensive 14 point ROS was performed, reviewed, and the pertinent orthopaedic findings are documented in the HPI.  Vitals: 07/24/22 1315 BP: 112/62   General Physical Examination:  Lungs are clear. Heart rate and rhythm is normal. HEENT is normal.  Radiographs:  X-ray of the right index finger obtained on 07/14/2022 show marked joint space loss of all the DIP joints with extensive spurring, subchondral cyst formation, and bone loss to the DIP joint of the middle finger with swelling.  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  Assessment: ICD-10-CM 1. 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 Atherosclerosis of abdominal aorta (CMS-HCC) I70.0  Plan:  The patient has clinical findings of severe osteoarthritis with extensive inflammation at the DIP joint of the middle finger.  The proposed plan of action involves a DIP fusion. The patient has been thoroughly informed about the risks, benefits, and potential complications associated with this procedure.  Document Attestation: Gillermina Phy, have reviewed and updated documentation for [name],  MD, utilizing [employer] DAX.  Electronically signed by Marlena Clipper, MD at 07/25/2022 7:48 PM EDT   Reviewed  H+P. No changes noted.

## 2022-08-08 NOTE — Discharge Instructions (Signed)
Keep arm elevated is much as possible and do not try to use the hand until bandage has been changed Do not worry if there is a little bit of bloody drainage from the tip of the finger that is common Keep dressing clean and dry Pain medicine as directed Call office if you are having problems [phone]

## 2022-08-08 NOTE — Anesthesia Procedure Notes (Signed)
Procedure Name: LMA Insertion Date/Time: 08/08/2022 12:16 PM  Performed by: Sameera Betton, [place], CRNAPre-anesthesia Checklist: Patient identified, Patient being monitored, Timeout performed, Emergency Drugs available and Suction available Patient Re-evaluated:Patient Re-evaluated prior to induction Oxygen Delivery Method: Circle system utilized Preoxygenation: Pre-oxygenation with 100% oxygen Induction Type: IV induction Ventilation: Mask ventilation without difficulty LMA: LMA inserted LMA Size: 4.0 Tube type: Oral Number of attempts: 1 Placement Confirmation: positive ETCO2 and breath sounds checked- equal and bilateral Tube secured with: Tape Dental Injury: Teeth and Oropharynx as per pre-operative assessment

## 2022-08-08 NOTE — Op Note (Addendum)
08/08/2022  12:57 PM  PATIENT:  Jane Griffin  82 y.o. 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  PROCEDURE:  Procedure(s): Right middle finger DISTAL INTERPHALANGEAL JOINT FUSION (Right)  SURGEON: Leitha Schuller, MD  ANESTHESIA:   general  EBL:  Total I/O In: 175 [IV Piggyback:175] Out: -   BLOOD ADMINISTERED:none  DRAINS: none   LOCAL MEDICATIONS USED:  MARCAINE    and Amount: 10 ml  SPECIMEN:  No Specimen  DISPOSITION OF SPECIMEN:  N/A  COUNTS:  YES  TOURNIQUET:   Total Tourniquet Time Documented: Upper Arm (Right) - 24 minutes Total: Upper Arm (Right) - 24 minutes   IMPLANTS: acumed fusion screw, 28 mm  DICTATION: .Dragon Dictation   Patient brought to the operating room and after adequate anesthesia was obtained the right arm was prepped and draped in sterile fashion was turned by the upper arm. After patient identification and timeout procedure were completed, tourniquet was raised to 250 mmHg. A T-shaped incision was made over the DIP joint and the extensor tendon incised dorsal spur was debrided. The sclerotic ends of the IP joint were debrided to get bleeding bone. K wires then inserted down the middle phalanx and then out through the distal phalanx and then back into the middle phalanx alignment appeared anatomic with slight flexion. Measurements were made off the K wire and a 28 mm screw was chosen. The drilling was carried out with a power drill. The 28 mm screw was inserted to the appropriate depth, with the head buried in the distal phalanx.  Guidewire removed without difficulty on mini C-arm views, there was anatomic alignment with good compression of the site of the fusion. The wound was then closed with 5-0 nylon. At the start close of the case 10 cc was infiltrated as a digital block for postoperative analgesia. There are no  complications no specimen. Wound was dressed with Xeroform 2 x 2's and a 1 inch, Conform dressing  PLAN OF CARE: Discharge to home after PACU

## 2022-08-08 NOTE — Transfer of Care (Signed)
Immediate Anesthesia Transfer of Care Note  Patient: Jane Griffin  Procedure(s) Performed: Right middle finger DISTAL INTERPHALANGEAL JOINT FUSION (Right: Hand)  Patient Location: PACU  Anesthesia Type: General  Level of Consciousness: awake, alert  and patient cooperative  Airway and Oxygen Therapy: Patient Spontanous Breathing and Patient connected to supplemental oxygen  Post-op Assessment: Post-op Vital signs reviewed, Patient's Cardiovascular Status Stable, Respiratory Function Stable, Patent Airway and No signs of Nausea or vomiting  Post-op Vital Signs: Reviewed and stable  Complications: No notable events documented.

## 2022-08-09 ENCOUNTER — Encounter: Payer: Self-pay | Admitting: Orthopedic Surgery

## 2022-08-09 NOTE — Anesthesia Postprocedure Evaluation (Signed)
Anesthesia Post Note  Patient: Jane Griffin  Procedure(s) Performed: Right middle finger DISTAL INTERPHALANGEAL JOINT FUSION (Right: Hand)  Patient location during evaluation: PACU Anesthesia Type: General Level of consciousness: awake and alert Pain management: pain level controlled Vital Signs Assessment: post-procedure vital signs reviewed and stable Respiratory status: spontaneous breathing, nonlabored ventilation, respiratory function stable and patient connected to nasal cannula oxygen Cardiovascular status: blood pressure returned to baseline and stable Postop Assessment: no apparent nausea or vomiting Anesthetic complications: no   No notable events documented.   Last Vitals:  Vitals:   08/08/22 1330 08/08/22 1337  BP: 130/72   Pulse: 77 88  Resp: 18 16  Temp:  36.5 C  SpO2: 98% 98%    Last Pain:  Vitals:   08/09/22 1206  TempSrc:   PainSc: 0-No pain                 Maydell Knoebel C Alexey Rhoads

## 2022-08-12 ENCOUNTER — Other Ambulatory Visit: Payer: Self-pay | Admitting: Oncology

## 2022-08-17 DIAGNOSIS — M1A00X0 Idiopathic chronic gout, unspecified site, without tophus (tophi): Secondary | ICD-10-CM | POA: Diagnosis present

## 2022-09-25 ENCOUNTER — Ambulatory Visit
Admission: RE | Admit: 2022-09-25 | Discharge: 2022-09-25 | Disposition: A | Discharge status: Home / Self Care | Payer: Medicare PPO | Source: Ambulatory Visit | Attending: Radiation Oncology | Admitting: Radiation Oncology

## 2022-09-25 ENCOUNTER — Encounter: Payer: Self-pay | Admitting: Radiation Oncology

## 2022-09-25 VITALS — BP 123/70 | HR 68 | Temp 98.0°F | Resp 18 | Wt 113.0 lb

## 2022-09-25 DIAGNOSIS — Z79810 Long term (current) use of selective estrogen receptor modulators (SERMs): Secondary | ICD-10-CM | POA: Insufficient documentation

## 2022-09-25 DIAGNOSIS — Z923 Personal history of irradiation: Secondary | ICD-10-CM | POA: Insufficient documentation

## 2022-09-25 DIAGNOSIS — C50412 Malignant neoplasm of upper-outer quadrant of left female breast: Secondary | ICD-10-CM | POA: Diagnosis present

## 2022-09-25 DIAGNOSIS — Z710 Person encountering health services to consult on behalf of another person: Secondary | ICD-10-CM | POA: Diagnosis not present

## 2022-09-25 NOTE — Progress Notes (Signed)
Radiation Oncology Follow up Note  Name: Jane Griffin   Date:   09/25/2022 MRN:  [id number] DOB: July 15, 1940    This 82 y.o. 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  REFERRING PROVIDER: Lauro Regulus, MD  HPI: Patient is an 82 year old female now out 7 months having completed whole breast radiation to her left breast for stage I ER/PR positive invasive mammary carcinoma.  Seen today in routine follow-up she is doing well.  She specifically denies breast tenderness cough or bone pain..  She is currently on tamoxifen tolerating that well without side effect.  She has not had a follow-up mammogram.  COMPLICATIONS OF TREATMENT: none  FOLLOW UP COMPLIANCE: keeps appointments   PHYSICAL EXAM:  BP 123/70 (BP Location: Left Arm, Patient Position: Sitting, Cuff Size: Normal)   Pulse 68   Temp 98 F (36.7 C) (Tympanic)   Resp 18   Wt 113 lb (51.3 kg)   SpO2 100%   BMI 20.67 kg/m  Lungs are clear to A&P cardiac examination essentially unremarkable with regular rate and rhythm. No dominant mass or nodularity is noted in either breast in 2 positions examined. Incision is well-healed. No axillary or supraclavicular adenopathy is appreciated. Cosmetic result is excellent.  Well-developed well-nourished patient in NAD. HEENT reveals PERLA, EOMI, discs not visualized.  Oral cavity is clear. No oral mucosal lesions are identified. Neck is clear without evidence of cervical or supraclavicular adenopathy. Lungs are clear to A&P. Cardiac examination is essentially unremarkable with regular rate and rhythm without murmur rub or thrill. Abdomen is benign with no organomegaly or masses noted. Motor sensory and DTR levels are equal and symmetric in the upper and lower extremities. Cranial nerves II through XII are grossly intact. Proprioception is intact. No peripheral adenopathy or  edema is identified. No motor or sensory levels are noted. Crude visual fields are within normal range.  RADIOLOGY RESULTS: No current films for review  PLAN: Present time patient is doing well with no evidence of disease now out 7 months.  And pleased with her overall progress.  I have asked to see her back in 6 months for follow-up.  Patient will have mammograms ordered by that time.  Patient is to call at anytime with any concerns.  I would like to take this opportunity to thank you for allowing me to participate in the care of your patient.Carmina Miller, MD

## 2022-11-17 ENCOUNTER — Encounter: Payer: Self-pay | Admitting: Oncology

## 2022-11-17 ENCOUNTER — Inpatient Hospital Stay: Payer: Medicare PPO | Attending: Oncology | Admitting: Oncology

## 2022-11-17 VITALS — BP 155/81 | HR 85 | Temp 99.0°F | Resp 16 | Ht 62.0 in | Wt 111.7 lb

## 2022-11-17 DIAGNOSIS — Z1721 Progesterone receptor positive status: Secondary | ICD-10-CM | POA: Insufficient documentation

## 2022-11-17 DIAGNOSIS — I10 Essential (primary) hypertension: Secondary | ICD-10-CM | POA: Insufficient documentation

## 2022-11-17 DIAGNOSIS — Z90722 Acquired absence of ovaries, bilateral: Secondary | ICD-10-CM | POA: Insufficient documentation

## 2022-11-17 DIAGNOSIS — C50919 Malignant neoplasm of unspecified site of unspecified female breast: Secondary | ICD-10-CM

## 2022-11-17 DIAGNOSIS — Z1732 Human epidermal growth factor receptor 2 negative status: Secondary | ICD-10-CM | POA: Diagnosis not present

## 2022-11-17 DIAGNOSIS — C50412 Malignant neoplasm of upper-outer quadrant of left female breast: Secondary | ICD-10-CM | POA: Insufficient documentation

## 2022-11-17 DIAGNOSIS — Z923 Personal history of irradiation: Secondary | ICD-10-CM | POA: Insufficient documentation

## 2022-11-17 DIAGNOSIS — Z90710 Acquired absence of both cervix and uterus: Secondary | ICD-10-CM | POA: Insufficient documentation

## 2022-11-17 DIAGNOSIS — Z803 Family history of malignant neoplasm of breast: Secondary | ICD-10-CM | POA: Insufficient documentation

## 2022-11-17 DIAGNOSIS — Z79810 Long term (current) use of selective estrogen receptor modulators (SERMs): Secondary | ICD-10-CM | POA: Diagnosis not present

## 2022-11-17 DIAGNOSIS — Z170 Estrogen receptor positive status [ER+]: Secondary | ICD-10-CM | POA: Insufficient documentation

## 2022-11-17 DIAGNOSIS — M109 Gout, unspecified: Secondary | ICD-10-CM | POA: Diagnosis not present

## 2022-11-17 DIAGNOSIS — G8929 Other chronic pain: Secondary | ICD-10-CM | POA: Insufficient documentation

## 2022-11-17 NOTE — Progress Notes (Signed)
Survivorship Care Plan visit completed.  Treatment summary reviewed and given to patient.  ASCO answers booklet reviewed and given to patient.  CARE program and Cancer Transitions discussed with patient along with other resources cancer center offers to patients and caregivers.  Patient verbalized understanding. 

## 2022-11-17 NOTE — Progress Notes (Signed)
[facility]  [phone] [phone]  ID: Jane Griffin OB: August 06, 1940  MR#: [id number]  KZS#:[id number]  Patient Care Team: Lauro Regulus, MD as PCP - General (Unknown Physician Specialty) Lauro Regulus, MD (Internal Medicine) Lemar Livings, Merrily Pew, MD (General Surgery) Hulen Luster, RN as Oncology Nurse Navigator Carmina Miller, MD as Consulting Physician (Radiation Oncology) Jeralyn Ruths, MD as Consulting Physician (Oncology)  CHIEF COMPLAINT: Stage Ia ER/PR positive, HER2 negative invasive carcinoma of the upper outer quadrant of the left breast.  INTERVAL HISTORY: Patient returns to clinic today for routine 60-month evaluation.  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  She continues to tolerate tamoxifen and Fosamax without significant side effects.  She has no neurologic complaints.  She denies any recent fevers or illnesses.  She has a good appetite and denies weight loss.  She has no chest pain, shortness of breath, cough, or hemoptysis.  She denies any nausea, vomiting, constipation, or diarrhea.  She has no urinary complaints.  Patient offers no further specific complaints today.  REVIEW OF SYSTEMS:   Review of Systems  Constitutional: Negative.  Negative for fever, malaise/fatigue and weight loss.  Respiratory: Negative.  Negative for cough and shortness of breath.   Cardiovascular: Negative.  Negative for chest pain and leg swelling.  Gastrointestinal: Negative.  Negative for abdominal pain.  Genitourinary: Negative.  Negative for dysuria.  Musculoskeletal:  Positive for joint pain. Negative for back pain.  Skin: Negative.  Negative for rash.  Neurological: Negative.  Negative for dizziness, focal weakness, weakness and headaches.  Psychiatric/Behavioral: Negative.  The patient is not nervous/anxious.     As per HPI. Otherwise, a complete review of systems is negative.  PAST MEDICAL  HISTORY: Past Medical History:  Diagnosis Date   Allergic rhinitis    Arthritis    joints   Cancer (HCC)    Cervical disc disease    Chronic kidney disease    STAGE 3   GERD (gastroesophageal reflux disease)    H/O blood clots    around her 20's   History of asthma    History of colon polyps    History of gout    Hyperlipidemia    Hypertension    PONV (postoperative nausea and vomiting)    nausea   Shingles     PAST SURGICAL HISTORY: Past Surgical History:  Procedure Laterality Date   ABDOMINAL HYSTERECTOMY     BREAST BIOPSY Left 10/26/2021   [place] Core Bx ribbon clip positive   BREAST LUMPECTOMY Left 11/11/2021   Procedure: BREAST LUMPECTOMY;  Surgeon: Earline Mayotte, MD;  Location: [facility];  Service: General;  Laterality: Left;   BREAST LUMPECTOMY Left 11/11/2021   invasive lobular ca/neg margins/closest margin .3 mm   COLONOSCOPY  2013   COLONOSCOPY WITH PROPOFOL N/A 10/11/2016   Procedure: COLONOSCOPY WITH PROPOFOL;  Surgeon: Kieth Brightly, MD;  Location: [facility];  Service: Endoscopy;  Laterality: N/A;   DISTAL INTERPHALANGEAL JOINT FUSION Right 08/08/2022   Procedure: Right middle finger DISTAL INTERPHALANGEAL JOINT FUSION;  Surgeon: Kennedy Bucker, MD;  Location: [facility];  Service: Orthopedics;  Laterality: Right;   ELBOW SURGERY Left    EYE SURGERY     both eyes cataract removed   FEMORAL HERNIA REPAIR Right 09/18/2015   Procedure: HERNIA REPAIR femoral  ADULT;  Surgeon: Kieth Brightly, MD;  Location: [facility];  Service: General;  Laterality: Right;   HERNIA REPAIR  LAPAROSCOPIC BILATERAL SALPINGO OOPHERECTOMY Bilateral 09/06/2015   Procedure: LAPAROSCOPIC BILATERAL SALPINGO OOPHORECTOMY;  Surgeon: Suzy Bouchard, MD;  Location: [facility];  Service: Gynecology;  Laterality: Bilateral;   PARTIAL HYSTERECTOMY     TUBAL LIGATION     UPPER GI ENDOSCOPY     WRIST SURGERY Right     FAMILY HISTORY: Family History   Problem Relation Age of Onset   Clotting disorder Mother    Heart failure Mother    Emphysema Father    Heart failure Father    Cancer Daughter        breast cancer   Breast cancer Daughter 82    ADVANCED DIRECTIVES (Y/N):  N  HEALTH MAINTENANCE: Social History   Tobacco Use   Smoking status: Never   Smokeless tobacco: Never  Substance Use Topics   Alcohol use: No   Drug use: No     Colonoscopy:  PAP:  Bone density:  Lipid panel:  Allergies  Allergen Reactions   Codeine Nausea Only   Gabapentin     nightmares   Lipitor [Atorvastatin]     Aches    Pravachol [Pravastatin Sodium]     aches   Tramadol     nausea   Aleve [Naproxen Sodium] Rash   Sulfa Antibiotics Itching and Rash   Sulfasalazine Itching and Rash    Current Outpatient Medications  Medication Sig Dispense Refill   acetaminophen (TYLENOL) 500 MG tablet Take 500 mg by mouth every 6 (six) hours as needed for mild pain.     alendronate (FOSAMAX) 70 MG tablet Take 1 tablet (70 mg total) by mouth once a week. Take with a full glass of water on an empty stomach. 12 tablet 3   clindamycin (CLEOCIN) 300 MG capsule Take 300 mg by mouth 3 (three) times daily.     Cyanocobalamin (VITAMIN B-12 IJ) Inject as directed every 30 (thirty) days.     Febuxostat 80 MG TABS Take 80 mg by mouth daily.     fluvastatin XL (LESCOL XL) 80 MG 24 hr tablet Take 80 mg by mouth daily.  2   olmesartan-hydrochlorothiazide (BENICAR HCT) 40-12.5 MG tablet Take 1 tablet by mouth daily.     pantoprazole (PROTONIX) 40 MG tablet TAKE 1 TABLET BY MOUTH ONCE A DAY 30-60 MINUTES BEFORE FIRST MEAL OF THE DAY 90 tablet 1   tamoxifen (NOLVADEX) 20 MG tablet TAKE 1 TABLET BY MOUTH EVERY DAY 90 tablet 1   HYDROcodone-acetaminophen (NORCO/VICODIN) 5-325 MG tablet Take 1 tablet by mouth every 4 (four) hours as needed for moderate pain. (Patient not taking: Reported on 09/25/2022) 20 tablet 0   No current facility-administered medications for this  visit.    OBJECTIVE: Vitals:   11/17/22 0952  BP: (!) 155/81  Pulse: 85  Resp: 16  Temp: 99 F (37.2 C)  SpO2: 100%     Body mass index is 20.43 kg/m.    ECOG FS:0 - Asymptomatic  General: Well-developed, well-nourished, no acute distress. Eyes: Pink conjunctiva, anicteric sclera. HEENT: Normocephalic, moist mucous membranes. Lungs: No audible wheezing or coughing. Heart: Regular rate and rhythm. Abdomen: Soft, nontender, no obvious distention. Musculoskeletal: No edema, cyanosis, or clubbing. Neuro: Alert, answering all questions appropriately. Cranial nerves grossly intact. Skin: No rashes or petechiae noted. Psych: Normal affect.  LAB RESULTS:  Lab Results  Component Value Date   NA 140 11/07/2021   K 4.6 08/03/2022   CL 111 11/07/2021   CO2 20 (L) 11/07/2021   GLUCOSE 102 (H)  11/07/2021   BUN 26 (H) 11/07/2021   CREATININE 1.63 (H) 11/07/2021   CALCIUM 9.1 11/07/2021   PROT 7.9 09/18/2015   ALBUMIN 4.0 09/18/2015   AST 19 09/18/2015   ALT 12 (L) 09/18/2015   ALKPHOS 80 09/18/2015   BILITOT 1.1 09/18/2015   GFRNONAA 32 (L) 11/07/2021   GFRAA 48 (L) 09/18/2015    Lab Results  Component Value Date   WBC 8.7 01/23/2022   NEUTROABS 6.1 10/10/2012   HGB 11.0 (L) 01/23/2022   HCT 33.8 (L) 01/23/2022   MCV 90.1 01/23/2022   PLT 241 01/23/2022     STUDIES: No results found.  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  MRI of the breast completed on November 04, 2021 did not reveal any additional malignancy.  Given the small size of the mass, she is considered a T1a therefore Oncotype testing nor chemotherapy was necessary.  Patient has now completed XRT.  Given her osteoporosis, patient was prescribed tamoxifen  which she will take for a total of 5 years completing treatment in January 2029.  No intervention is needed.  Patient will require repeat mammogram in December 2024.  Return to clinic in 6 months for routine evaluation.   Osteoporosis: Bone mineral density completed on January 06, 2022 revealed a T-score of -3.5.  Continue Fosamax, calcium, and vitamin D.  Tamoxifen as above.  Repeat bone mineral density in December 2024 along with mammogram as above. Hypertension: Patient's blood pressure is moderately elevated today.  Continue monitoring and treatment per primary care. Joint pain: Likely secondary to gout.  Continue current medications as prescribed.   Patient expressed understanding and was in agreement with this plan. She also understands that She can call clinic at any time with any questions, concerns, or complaints.    Cancer Staging  Invasive ductal carcinoma of left breast [facility]) Staging form: Breast, AJCC 8th Edition - Clinical stage from 11/04/2021: Stage IA (cT1a, cN0, cM0, G2, ER+, PR+, HER2-) - Signed by Jeralyn Ruths, MD on 11/04/2021 Stage prefix: Initial diagnosis Histologic grading system: 3 grade system  Jeralyn Ruths, MD   11/17/2022 11:54 AM

## 2022-12-04 ENCOUNTER — Observation Stay
Admission: EM | Admit: 2022-12-04 | Discharge: 2022-12-05 | Disposition: A | Discharge status: Home / Self Care | Payer: Medicare PPO | Attending: Internal Medicine | Admitting: Internal Medicine

## 2022-12-04 ENCOUNTER — Ambulatory Visit
Admission: RE | Admit: 2022-12-04 | Discharge: 2022-12-04 | Disposition: A | Discharge status: Home / Self Care | Payer: Medicare PPO | Source: Ambulatory Visit | Attending: Internal Medicine | Admitting: Internal Medicine

## 2022-12-04 ENCOUNTER — Emergency Department: Payer: Medicare PPO

## 2022-12-04 ENCOUNTER — Other Ambulatory Visit: Payer: Self-pay

## 2022-12-04 ENCOUNTER — Other Ambulatory Visit: Payer: Self-pay | Admitting: Internal Medicine

## 2022-12-04 ENCOUNTER — Other Ambulatory Visit
Admission: RE | Admit: 2022-12-04 | Discharge: 2022-12-04 | Disposition: A | Discharge status: Home / Self Care | Payer: Medicare PPO | Source: Ambulatory Visit | Attending: Internal Medicine | Admitting: Internal Medicine

## 2022-12-04 ENCOUNTER — Encounter: Payer: Self-pay | Admitting: Emergency Medicine

## 2022-12-04 DIAGNOSIS — D649 Anemia, unspecified: Secondary | ICD-10-CM | POA: Insufficient documentation

## 2022-12-04 DIAGNOSIS — R7989 Other specified abnormal findings of blood chemistry: Secondary | ICD-10-CM | POA: Diagnosis not present

## 2022-12-04 DIAGNOSIS — E876 Hypokalemia: Secondary | ICD-10-CM | POA: Diagnosis not present

## 2022-12-04 DIAGNOSIS — R0789 Other chest pain: Secondary | ICD-10-CM | POA: Diagnosis not present

## 2022-12-04 DIAGNOSIS — J45909 Unspecified asthma, uncomplicated: Secondary | ICD-10-CM | POA: Insufficient documentation

## 2022-12-04 DIAGNOSIS — D509 Iron deficiency anemia, unspecified: Secondary | ICD-10-CM | POA: Insufficient documentation

## 2022-12-04 DIAGNOSIS — Z79899 Other long term (current) drug therapy: Secondary | ICD-10-CM | POA: Insufficient documentation

## 2022-12-04 DIAGNOSIS — I129 Hypertensive chronic kidney disease with stage 1 through stage 4 chronic kidney disease, or unspecified chronic kidney disease: Secondary | ICD-10-CM | POA: Insufficient documentation

## 2022-12-04 DIAGNOSIS — N179 Acute kidney failure, unspecified: Secondary | ICD-10-CM | POA: Diagnosis not present

## 2022-12-04 DIAGNOSIS — I214 Non-ST elevation (NSTEMI) myocardial infarction: Secondary | ICD-10-CM | POA: Diagnosis not present

## 2022-12-04 DIAGNOSIS — R42 Dizziness and giddiness: Secondary | ICD-10-CM

## 2022-12-04 DIAGNOSIS — N183 Chronic kidney disease, stage 3 unspecified: Secondary | ICD-10-CM | POA: Insufficient documentation

## 2022-12-04 DIAGNOSIS — R059 Cough, unspecified: Secondary | ICD-10-CM | POA: Insufficient documentation

## 2022-12-04 DIAGNOSIS — E872 Acidosis, unspecified: Secondary | ICD-10-CM | POA: Insufficient documentation

## 2022-12-04 DIAGNOSIS — Z79810 Long term (current) use of selective estrogen receptor modulators (SERMs): Secondary | ICD-10-CM | POA: Insufficient documentation

## 2022-12-04 DIAGNOSIS — R0600 Dyspnea, unspecified: Secondary | ICD-10-CM | POA: Insufficient documentation

## 2022-12-04 DIAGNOSIS — D631 Anemia in chronic kidney disease: Secondary | ICD-10-CM | POA: Diagnosis not present

## 2022-12-04 DIAGNOSIS — I2489 Other forms of acute ischemic heart disease: Secondary | ICD-10-CM | POA: Diagnosis not present

## 2022-12-04 DIAGNOSIS — E878 Other disorders of electrolyte and fluid balance, not elsewhere classified: Secondary | ICD-10-CM | POA: Insufficient documentation

## 2022-12-04 DIAGNOSIS — Z1152 Encounter for screening for COVID-19: Secondary | ICD-10-CM | POA: Insufficient documentation

## 2022-12-04 DIAGNOSIS — N1832 Chronic kidney disease, stage 3b: Secondary | ICD-10-CM | POA: Insufficient documentation

## 2022-12-04 DIAGNOSIS — C50912 Malignant neoplasm of unspecified site of left female breast: Secondary | ICD-10-CM | POA: Diagnosis present

## 2022-12-04 DIAGNOSIS — I2510 Atherosclerotic heart disease of native coronary artery without angina pectoris: Secondary | ICD-10-CM | POA: Insufficient documentation

## 2022-12-04 DIAGNOSIS — N189 Chronic kidney disease, unspecified: Secondary | ICD-10-CM | POA: Insufficient documentation

## 2022-12-04 DIAGNOSIS — F4321 Adjustment disorder with depressed mood: Secondary | ICD-10-CM

## 2022-12-04 DIAGNOSIS — Z9889 Other specified postprocedural states: Secondary | ICD-10-CM | POA: Diagnosis not present

## 2022-12-04 DIAGNOSIS — R0602 Shortness of breath: Secondary | ICD-10-CM

## 2022-12-04 DIAGNOSIS — Z853 Personal history of malignant neoplasm of breast: Secondary | ICD-10-CM | POA: Diagnosis not present

## 2022-12-04 LAB — MAGNESIUM: Magnesium: 1.4 mg/dL — ABNORMAL LOW (ref 1.7–2.4)

## 2022-12-04 LAB — PHOSPHORUS: Phosphorus: 4.5 mg/dL (ref 2.5–4.6)

## 2022-12-04 LAB — CBC
HCT: 29.9 % — ABNORMAL LOW (ref 36.0–46.0)
Hemoglobin: 9.5 g/dL — ABNORMAL LOW (ref 12.0–15.0)
MCH: 29.7 pg (ref 26.0–34.0)
MCHC: 31.8 g/dL (ref 30.0–36.0)
MCV: 93.4 fL (ref 80.0–100.0)
Platelets: 211 10*3/uL (ref 150–400)
RBC: 3.2 MIL/uL — ABNORMAL LOW (ref 3.87–5.11)
RDW: 12.8 % (ref 11.5–15.5)
WBC: 7.1 10*3/uL (ref 4.0–10.5)
nRBC: 0 % (ref 0.0–0.2)

## 2022-12-04 LAB — BASIC METABOLIC PANEL
Anion gap: 6 (ref 5–15)
BUN: 20 mg/dL (ref 8–23)
CO2: 16 mmol/L — ABNORMAL LOW (ref 22–32)
Calcium: 6.2 mg/dL — CL (ref 8.9–10.3)
Chloride: 120 mmol/L — ABNORMAL HIGH (ref 98–111)
Creatinine, Ser: 1.45 mg/dL — ABNORMAL HIGH (ref 0.44–1.00)
GFR, Estimated: 36 mL/min — ABNORMAL LOW (ref >60)
Glucose, Bld: 81 mg/dL (ref 70–99)
Potassium: 2.9 mmol/L — ABNORMAL LOW (ref 3.5–5.1)
Sodium: 142 mmol/L (ref 135–145)

## 2022-12-04 LAB — TROPONIN I (HIGH SENSITIVITY)
Troponin I (High Sensitivity): 79 ng/L — ABNORMAL HIGH (ref <18)
Troponin I (High Sensitivity): 106 ng/L (ref <18)

## 2022-12-04 LAB — D-DIMER, QUANTITATIVE: D-Dimer, Quant: 2.7 ug{FEU}/mL — ABNORMAL HIGH (ref 0.00–0.50)

## 2022-12-04 LAB — POCT I-STAT CREATININE: Creatinine, Ser: 2.3 mg/dL — ABNORMAL HIGH (ref 0.44–1.00)

## 2022-12-04 MED ORDER — MAGNESIUM SULFATE 2 GM/50ML IV SOLN
2.0000 g | Freq: Once | INTRAVENOUS | Status: AC
  Administered 2022-12-04: 2 g via INTRAVENOUS
  Filled 2022-12-04: qty 50

## 2022-12-04 MED ORDER — POTASSIUM CHLORIDE 10 MEQ/100ML IV SOLN
10.0000 meq | INTRAVENOUS | Status: AC
Start: 2022-12-04 — End: 2022-12-05
  Administered 2022-12-04 – 2022-12-05 (×2): 10 meq via INTRAVENOUS
  Filled 2022-12-04 (×2): qty 100

## 2022-12-04 MED ORDER — POTASSIUM CHLORIDE CRYS ER 20 MEQ PO TBCR
40.0000 meq | EXTENDED_RELEASE_TABLET | Freq: Once | ORAL | Status: AC
  Administered 2022-12-04: 40 meq via ORAL
  Filled 2022-12-04: qty 2

## 2022-12-04 MED ORDER — CALCIUM GLUCONATE-NACL 1-0.675 GM/50ML-% IV SOLN
1.0000 g | Freq: Once | INTRAVENOUS | Status: AC
  Administered 2022-12-04: 1000 mg via INTRAVENOUS
  Filled 2022-12-04: qty 50

## 2022-12-04 MED ORDER — IOHEXOL 350 MG/ML SOLN
60.0000 mL | Freq: Once | INTRAVENOUS | Status: AC | PRN
  Administered 2022-12-04: 60 mL via INTRAVENOUS

## 2022-12-04 MED ORDER — ASPIRIN 325 MG PO TABS
325.0000 mg | ORAL_TABLET | Freq: Once | ORAL | Status: AC
  Administered 2022-12-04: 325 mg via ORAL
  Filled 2022-12-04: qty 1

## 2022-12-04 MED ORDER — CALCIUM GLUCONATE-NACL 1-0.675 GM/50ML-% IV SOLN: 1.0000 g | Freq: Once | INTRAVENOUS | Status: DC

## 2022-12-04 NOTE — ED Triage Notes (Signed)
Patient to ED from outpatient imagining. Pt was suppose to be getting a CT to r/o blood clot- d dimer (2.7) but I stat creatinine 2.3 and GFR 21. Pt states tightness in chest at this time.

## 2022-12-04 NOTE — ED Notes (Signed)
Pt returned from CT °

## 2022-12-04 NOTE — ED Provider Notes (Signed)
[facility] Provider Note    Event Date/Time   First MD Initiated Contact with Patient 12/04/22 1948     (approximate)   History   Abnormal Labs   HPI  Jane Griffin is a 82 year old female with history of hypertension presenting to the emergency department for evaluation of shortness of breath.  Patient reports that for the past few weeks she has had some shortness of breath with a cough.  No fevers or chills.  More recently she has had some tightness in her chest that she thinks could be worse with coughing.  She has also noticed worsening exertional shortness of breath.  She saw her primary care doctor who ordered a D-dimer that returned positive at 2.7.  She presented to the imaging center, but her point-of-care creatinine was elevated, so she was directed to the ER.    Physical Exam   Triage Vital Signs: ED Triage Vitals [12/04/22 1848]  Encounter Vitals Group     BP (!) 164/75     Systolic BP Percentile      Diastolic BP Percentile      Pulse Rate 79     Resp 18     Temp 98.9 F (37.2 C)     Temp Source Oral     SpO2 98 %     Weight 111 lb (50.3 kg)     Height 5\' 2"  (1.575 m)     Head Circumference      Peak Flow      Pain Score 5     Pain Loc      Pain Education      Exclude from Growth Chart     Most recent vital signs: Vitals:   12/04/22 2200 12/04/22 2300  BP: (!) 147/102 (!) 162/81  Pulse: 69 69  Resp: (!) 23 15  Temp:    SpO2: 99% 99%     General: Awake, interactive  CV:  Regular rate, good peripheral perfusion.  Resp:  Lungs clear to auscultation, unlabored respirations Abd:  Nondistended.  Neuro:  Symmetric facial movement, fluid speech   ED Results / Procedures / Treatments   Labs (all labs ordered are listed, but only abnormal results are displayed) Labs Reviewed  BASIC METABOLIC PANEL - Abnormal; Notable for the following components:      Result Value   Potassium 2.9 (*)    Chloride 120 (*)    CO2 16 (*)     Creatinine, Ser 1.45 (*)    Calcium 6.2 (*)    GFR, Estimated 36 (*)    All other components within normal limits  CBC - Abnormal; Notable for the following components:   RBC 3.20 (*)    Hemoglobin 9.5 (*)    HCT 29.9 (*)    All other components within normal limits  MAGNESIUM - Abnormal; Notable for the following components:   Magnesium 1.4 (*)    All other components within normal limits  TROPONIN I (HIGH SENSITIVITY) - Abnormal; Notable for the following components:   Troponin I (High Sensitivity) 79 (*)    All other components within normal limits  TROPONIN I (HIGH SENSITIVITY) - Abnormal; Notable for the following components:   Troponin I (High Sensitivity) 106 (*)    All other components within normal limits  PHOSPHORUS     EKG EKG independently reviewed interpreted by myself (ER attending) demonstrates:  EKG demonstrates sinus rhythm at a rate of 82, PR 164, QRS 114, QTc 446, no acute  ST changes, 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  Radiology notes compression deformity at T12, uncertain age, no reported pain from patient, scarring of the left upper lobe.  PROCEDURES:  Critical Care performed: Yes, see critical care procedure note(s)  CRITICAL CARE Performed by: Trinna Post   Total critical care time: 41 minutes  Critical care time was exclusive of separately billable procedures and treating other patients.  Critical care was necessary to treat or prevent imminent or life-threatening deterioration.  Critical care was time spent personally by me on the following activities: development of treatment plan with patient and/or surrogate as well as nursing, discussions with consultants, evaluation of patient's response to treatment, examination of patient, obtaining history  from patient or surrogate, ordering and performing treatments and interventions, ordering and review of laboratory studies, ordering and review of radiographic studies, pulse oximetry and re-evaluation of patient's condition.   Procedures   MEDICATIONS ORDERED IN ED: Medications  potassium chloride 10 mEq in 100 mL IVPB (10 mEq Intravenous New Bag/Given 12/04/22 2305)  magnesium sulfate IVPB 2 g 50 mL (2 g Intravenous New Bag/Given 12/04/22 2304)  iohexol (OMNIPAQUE) 350 MG/ML injection 60 mL (60 mLs Intravenous Contrast Given 12/04/22 2112)  calcium gluconate 1 g/ 50 mL sodium chloride IVPB (0 mg Intravenous Stopped 12/04/22 2244)  potassium chloride SA (KLOR-CON M) CR tablet 40 mEq (40 mEq Oral Given 12/04/22 2305)  aspirin tablet 325 mg (325 mg Oral Given 12/04/22 2345)     IMPRESSION / MDM / ASSESSMENT AND PLAN / ED COURSE  I reviewed the triage vital signs and the nursing notes.  Differential diagnosis includes, but is not limited to, PE, ACS pneumonia, pneumothorax, asthma exacerbation  Patient's presentation is most consistent with acute presentation with potential threat to life or bodily function.  82 year old female presenting to the emergency department for evaluation of shortness of breath and chest tightness.  Elevated D-dimer as an outpatient.  Blood work here actually with significantly improved creatinine at 1.45 with GFR of 36.  With this, CT angio was ordered which was fortunately without evidence of PE.  However, her lab redemonstrates multiple derangements including a low calcium of 6.2, potassium of 2.9, magnesium of 1.4.  Repletion ordered.  Does have anemia, slightly down from prior.  Troponin elevated at 76, repeat increased to 106.  Aspirin and heparin ordered.  With her chest tightness and rising troponin, do think she is appropriate for admission for further ischemic evaluation.  Will reach out to hospitalist team.      FINAL CLINICAL IMPRESSION(S) / ED  DIAGNOSES   Final diagnoses:  Shortness of breath  Chest tightness  Elevated troponin     Rx / DC Orders   ED Discharge Orders     None        Note:  This document was prepared using Dragon voice recognition software and may include unintentional dictation errors.   Trinna Post, MD 12/05/22 0000

## 2022-12-04 NOTE — ED Notes (Signed)
Lab reports Calcium 6.2; acuity level changed

## 2022-12-05 ENCOUNTER — Other Ambulatory Visit: Payer: Self-pay

## 2022-12-05 ENCOUNTER — Inpatient Hospital Stay
Admit: 2022-12-05 | Discharge: 2022-12-05 | Disposition: A | Discharge status: Home / Self Care | Payer: Medicare PPO | Attending: Internal Medicine | Admitting: Internal Medicine

## 2022-12-05 DIAGNOSIS — R0789 Other chest pain: Secondary | ICD-10-CM

## 2022-12-05 DIAGNOSIS — I214 Non-ST elevation (NSTEMI) myocardial infarction: Secondary | ICD-10-CM | POA: Diagnosis not present

## 2022-12-05 DIAGNOSIS — N189 Chronic kidney disease, unspecified: Secondary | ICD-10-CM | POA: Insufficient documentation

## 2022-12-05 DIAGNOSIS — R7989 Other specified abnormal findings of blood chemistry: Secondary | ICD-10-CM

## 2022-12-05 DIAGNOSIS — D649 Anemia, unspecified: Secondary | ICD-10-CM | POA: Insufficient documentation

## 2022-12-05 DIAGNOSIS — E872 Acidosis, unspecified: Secondary | ICD-10-CM | POA: Insufficient documentation

## 2022-12-05 DIAGNOSIS — E876 Hypokalemia: Secondary | ICD-10-CM

## 2022-12-05 DIAGNOSIS — R42 Dizziness and giddiness: Secondary | ICD-10-CM

## 2022-12-05 DIAGNOSIS — F4321 Adjustment disorder with depressed mood: Secondary | ICD-10-CM

## 2022-12-05 DIAGNOSIS — R059 Cough, unspecified: Secondary | ICD-10-CM | POA: Insufficient documentation

## 2022-12-05 LAB — PROTIME-INR
INR: 1.1 (ref 0.8–1.2)
Prothrombin Time: 14.6 s (ref 11.4–15.2)

## 2022-12-05 LAB — HEPATIC FUNCTION PANEL
ALT: 9 U/L (ref 0–44)
AST: 20 U/L (ref 15–41)
Albumin: 3.3 g/dL — ABNORMAL LOW (ref 3.5–5.0)
Alkaline Phosphatase: 34 U/L — ABNORMAL LOW (ref 38–126)
Bilirubin, Direct: <0.1 mg/dL (ref 0.0–0.2)
Total Bilirubin: 0.5 mg/dL (ref 0.3–1.2)
Total Protein: 6.2 g/dL — ABNORMAL LOW (ref 6.5–8.1)

## 2022-12-05 LAB — BASIC METABOLIC PANEL
Anion gap: 5 (ref 5–15)
BUN: 26 mg/dL — ABNORMAL HIGH (ref 8–23)
CO2: 20 mmol/L — ABNORMAL LOW (ref 22–32)
Calcium: 8.3 mg/dL — ABNORMAL LOW (ref 8.9–10.3)
Chloride: 112 mmol/L — ABNORMAL HIGH (ref 98–111)
Creatinine, Ser: 1.73 mg/dL — ABNORMAL HIGH (ref 0.44–1.00)
GFR, Estimated: 30 mL/min — ABNORMAL LOW (ref >60)
Glucose, Bld: 94 mg/dL (ref 70–99)
Potassium: 5 mmol/L (ref 3.5–5.1)
Sodium: 137 mmol/L (ref 135–145)

## 2022-12-05 LAB — ECHOCARDIOGRAM COMPLETE
AR max vel: 2.69 cm2
AV Area VTI: 2.82 cm2
AV Area mean vel: 2.67 cm2
AV Mean grad: 4 mm[Hg]
AV Peak grad: 9.2 mm[Hg]
Ao pk vel: 1.52 m/s
Area-P 1/2: 4.83 cm2
Height: 62 in
MV M vel: 5.83 m/s
MV Peak grad: 136 mm[Hg]
MV VTI: 2.63 cm2
Radius: 0.6 cm
S' Lateral: 2.8 cm
Weight: 1776 [oz_av]

## 2022-12-05 LAB — SARS CORONAVIRUS 2 BY RT PCR: SARS Coronavirus 2 by RT PCR: NEGATIVE

## 2022-12-05 LAB — APTT: aPTT: 29 s (ref 24–36)

## 2022-12-05 LAB — TROPONIN I (HIGH SENSITIVITY): Troponin I (High Sensitivity): 91 ng/L — ABNORMAL HIGH (ref <18)

## 2022-12-05 LAB — MAGNESIUM: Magnesium: 2.2 mg/dL (ref 1.7–2.4)

## 2022-12-05 LAB — HEPARIN LEVEL (UNFRACTIONATED): Heparin Unfractionated: 0.23 [IU]/mL — ABNORMAL LOW (ref 0.30–0.70)

## 2022-12-05 MED ORDER — TAMOXIFEN CITRATE 10 MG PO TABS
20.0000 mg | ORAL_TABLET | Freq: Every day | ORAL | Status: DC
  Administered 2022-12-05: 20 mg via ORAL
  Filled 2022-12-05: qty 2

## 2022-12-05 MED ORDER — HEPARIN BOLUS VIA INFUSION
750.0000 [IU] | Freq: Once | INTRAVENOUS | Status: AC
  Administered 2022-12-05: 750 [IU] via INTRAVENOUS
  Filled 2022-12-05: qty 750

## 2022-12-05 MED ORDER — METOPROLOL TARTRATE 25 MG PO TABS
12.5000 mg | ORAL_TABLET | Freq: Two times a day (BID) | ORAL | Status: DC
  Administered 2022-12-05 (×2): 12.5 mg via ORAL
  Filled 2022-12-05 (×2): qty 1

## 2022-12-05 MED ORDER — ASPIRIN 81 MG PO TBEC: 81.0000 mg | DELAYED_RELEASE_TABLET | Freq: Every day | ORAL | Status: DC

## 2022-12-05 MED ORDER — ONDANSETRON HCL 4 MG/2ML IJ SOLN: 4.0000 mg | Freq: Four times a day (QID) | INTRAMUSCULAR | Status: DC | PRN

## 2022-12-05 MED ORDER — PANTOPRAZOLE SODIUM 40 MG PO TBEC
40.0000 mg | DELAYED_RELEASE_TABLET | Freq: Every day | ORAL | Status: DC
  Administered 2022-12-05: 40 mg via ORAL
  Filled 2022-12-05: qty 1

## 2022-12-05 MED ORDER — HEPARIN BOLUS VIA INFUSION
3000.0000 [IU] | Freq: Once | INTRAVENOUS | Status: AC
  Administered 2022-12-05: 3000 [IU] via INTRAVENOUS
  Filled 2022-12-05: qty 3000

## 2022-12-05 MED ORDER — HEPARIN (PORCINE) 25000 UT/250ML-% IV SOLN
750.0000 [IU]/h | INTRAVENOUS | Status: DC
  Administered 2022-12-05: 650 [IU]/h via INTRAVENOUS
  Filled 2022-12-05: qty 250

## 2022-12-05 MED ORDER — IRBESARTAN 150 MG PO TABS
300.0000 mg | ORAL_TABLET | Freq: Every day | ORAL | Status: DC
  Administered 2022-12-05: 300 mg via ORAL
  Filled 2022-12-05: qty 2

## 2022-12-05 MED ORDER — ACETAMINOPHEN 325 MG PO TABS: 650.0000 mg | ORAL_TABLET | ORAL | Status: DC | PRN

## 2022-12-05 MED ORDER — NITROGLYCERIN 0.4 MG SL SUBL: 0.4000 mg | SUBLINGUAL_TABLET | SUBLINGUAL | Status: DC | PRN

## 2022-12-05 NOTE — Progress Notes (Signed)
ANTICOAGULATION CONSULT NOTE  Pharmacy Consult for heparin infusion Indication: ACS/STEMI  Allergies  Allergen Reactions   Codeine Nausea Only   Gabapentin     nightmares   Lipitor [Atorvastatin]     Aches    Pravachol [Pravastatin Sodium]     aches   Tramadol     nausea   Aleve [Naproxen Sodium] Rash   Sulfa Antibiotics Itching and Rash   Sulfasalazine Itching and Rash    Patient Measurements: Height: 5\' 2"  (157.5 cm) Weight: 50.3 kg (111 lb) IBW/kg (Calculated) : 50.1 Heparin Dosing Weight: 50.3 kg  Vital Signs: Temp: 98.9 F (37.2 C) (10/28 1848) Temp Source: Oral (10/28 1848) BP: 164/85 (10/28 2330) Pulse Rate: 78 (10/28 2330)  Labs: Recent Labs    12/04/22 1826 12/04/22 1851 12/04/22 2056  HGB  --  9.5*  --   HCT  --  29.9*  --   PLT  --  211  --   CREATININE 2.30* 1.45*  --   TROPONINIHS  --  79* 106*    Estimated Creatinine Clearance: 24.5 mL/min (A) (by C-G formula based on SCr of 1.45 mg/dL (H)).   Medical History: Past Medical History:  Diagnosis Date   Allergic rhinitis    Arthritis    joints   Cancer (HCC)    Cervical disc disease    Chronic kidney disease    STAGE 3   GERD (gastroesophageal reflux disease)    H/O blood clots    around her 20's   History of asthma    History of colon polyps    History of gout    Hyperlipidemia    Hypertension    PONV (postoperative nausea and vomiting)    nausea   Shingles     Assessment: Pt is a 82 yo female presenting to ED c/o SOB & chest tightness, found with elevated troponin I level trending up.  Goal of Therapy:  Heparin level 0.3-0.7 units/ml Monitor platelets by anticoagulation protocol: Yes   Plan:  Bolus 3000 units x 1 Start heparin infusion at 650 units/hr Will check HL in 8 hr after start of infusion CBC daily while on heparin  Otelia Sergeant, PharmD, [facility] 12/05/2022 12:05 AM

## 2022-12-05 NOTE — Assessment & Plan Note (Signed)
S/p resection and XRT Continue tamoxifen

## 2022-12-05 NOTE — Assessment & Plan Note (Signed)
Hemoglobin 9.5 down from 11 several months prior Anemia panel and continue to monitor

## 2022-12-05 NOTE — Assessment & Plan Note (Signed)
Metabolic acidosis Continue to monitor Consider nephrology consult

## 2022-12-05 NOTE — Assessment & Plan Note (Addendum)
Possible demand ischemia Palpitations and recurrent presyncope patient with dyspnea on exertion occasional chest tightness and palpitations with troponin 79-1 06 Continue heparin infusion started in the ED Continuous cardiac monitoring Echocardiogram Aspirin, beta-blocker and continue [facility] Cardiology consult

## 2022-12-05 NOTE — ED Provider Notes (Signed)
-----------------------------------------   12:00 AM on 12/05/2022 -----------------------------------------  Assuming care from Dr. Rosalia Hammers.  In short, Jane Griffin is a 82 y.o. female with a chief complaint of chest tightness and multiple lab and electrolyte abnormalities.  Refer to the original H&P for additional details.  The current plan of care is to admit to the hospitalist service.     Clinical Course as of 12/05/22 0042  Tue Dec 05, 2022  0029 Talked with hospitalist flow coordinator about why patient has not yet been paged to the hospitalist.  She confirmed there was a problem on their end, but they are paging out the patient now.a [CF]  0041 Consulted with Dr. Para March with the hospitalist service who will admit the patient [CF]    Clinical Course User Index [CF] Loleta Rose, MD     Medications  potassium chloride 10 mEq in 100 mL IVPB (10 mEq Intravenous New Bag/Given 12/05/22 0005)  heparin bolus via infusion 3,000 Units (has no administration in time range)  heparin ADULT infusion 100 units/mL (25000 units/21mL) (has no administration in time range)  iohexol (OMNIPAQUE) 350 MG/ML injection 60 mL (60 mLs Intravenous Contrast Given 12/04/22 2112)  calcium gluconate 1 g/ 50 mL sodium chloride IVPB (0 mg Intravenous Stopped 12/04/22 2244)  potassium chloride SA (KLOR-CON M) CR tablet 40 mEq (40 mEq Oral Given 12/04/22 2305)  magnesium sulfate IVPB 2 g 50 mL (0 g Intravenous Stopped 12/05/22 0007)  aspirin tablet 325 mg (325 mg Oral Given 12/04/22 2345)     ED Discharge Orders     None      Final diagnoses:  Shortness of breath  Chest tightness  Elevated troponin  NSTEMI (non-ST elevated myocardial infarction) (HCC)  Hypomagnesemia  Hypocalcemia  Hypokalemia     Loleta Rose, MD 12/05/22 [phone]

## 2022-12-05 NOTE — Assessment & Plan Note (Signed)
Received IV repletion in the ED Continue to monitor and replete as needed to mag over 2

## 2022-12-05 NOTE — Assessment & Plan Note (Addendum)
Possibly related to thiazide diuretic Received IV and oral repletion in the ED Continue to replete the potassium over 4 review of history of palpitations Hold thiazide

## 2022-12-05 NOTE — Assessment & Plan Note (Addendum)
Possibly related to electrolyte disturbance versus NSTEMI or combination of both Neurologic checks Fall precautions Syncope workup with continuous cardiac monitoring to evaluate for arrhythmia, and echocardiogram and carotid Doppler Correct electrolyte disturbances-see related problems Hold diuretic

## 2022-12-05 NOTE — Assessment & Plan Note (Signed)
Patient lost her daughter to MS a month prior and son states she has been grieving

## 2022-12-05 NOTE — TOC Initial Note (Signed)
Transition of Care [facility]) - Initial/Assessment Note    Patient Details  Name: Jane Griffin MRN: [id number] Date of Birth: 07-07-1940  [facility] CM/SW Contact:    Marquita Palms, LCSW Phone Number: 12/05/2022, 3:24 PM  Clinical Narrative:                  CSW met with patient and her son at bedside. Patient going home with family. She has appointment with PCP on Thursday for a stress test. [facility] [place] for [facility]. CSW explained Code 44 to patient and her son. Awaiting discharge.       Patient Goals and CMS Choice            Expected Discharge Plan and Services         Expected Discharge Date: 12/05/22                                    Prior Living Arrangements/Services                       Activities of Daily Living      Permission Sought/Granted                  Emotional Assessment              Admission diagnosis:  NSTEMI (non-ST elevated myocardial infarction) [facility]) [I21.4] Patient Active Problem List   Diagnosis Date Noted   NSTEMI (non-ST elevated myocardial infarction) (HCC) 12/05/2022   Hypomagnesemia 12/05/2022   Hypokalemia 12/05/2022   Hypocalcemia 12/05/2022   Postural dizziness with presyncope 12/05/2022   Anemia 12/05/2022   Metabolic acidosis 12/05/2022   Cough 12/05/2022   Grief at loss of child 12/05/2022   Chest tightness 12/05/2022   Elevated troponin 12/05/2022   Chronic kidney disease    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   Ventral hernia without obstruction or gangrene 06/16/2015   Elevated serum creatinine 06/16/2015   Ovarian mass, right 06/16/2015   Upper airway cough syndrome 03/14/2012   PCP:  Lauro Regulus, MD Pharmacy:   [facility] [address], [place] - [address] [place] [address] Phone: [phone] Fax: [phone]     Social Determinants of Health (SDOH) Social History: SDOH  Screenings   Food Insecurity: No Food Insecurity (10/13/2022)   Received from [facility]  Housing: Low Risk  (11/29/2021)  Transportation Needs: No Transportation Needs (10/13/2022)   Received from [facility]  Utilities: Not At Risk (10/13/2022)   Received from [facility]  Alcohol Screen: Low Risk  (11/29/2021)  Depression (PHQ2-9): Low Risk  (11/29/2021)  Financial Resource Strain: Low Risk  (10/13/2022)   Received from [facility]  Physical Activity: Insufficiently Active (11/29/2021)  Social Connections: Moderately Integrated (11/29/2021)  Stress: Stress Concern Present (11/29/2021)  Tobacco Use: Low Risk  (12/04/2022)   SDOH Interventions:     Readmission Risk Interventions    12/05/2022    3:23 PM  Readmission Risk Prevention Plan  Transportation Screening Complete  PCP or Specialist Appt within 3-5 Days Complete  HRI or Home Care Consult Patient refused  Social Work Consult for Recovery Care Planning/Counseling Patient refused  Palliative Care Screening Patient Refused  Medication Review Oceanographer) Complete

## 2022-12-05 NOTE — Assessment & Plan Note (Signed)
Repleted with calcium gluconate in the ED Continue to monitor and correct as needed Follow-up albumin

## 2022-12-05 NOTE — Consult Note (Signed)
PHARMACY CONSULT NOTE - ELECTROLYTES  Pharmacy Consult for Electrolyte Monitoring and Replacement   Recent Labs: Height: 5\' 2"  (157.5 cm) Weight: 50.3 kg (111 lb) IBW/kg (Calculated) : 50.1 Estimated Creatinine Clearance: 20.5 mL/min (A) (by C-G formula based on SCr of 1.73 mg/dL (H)). Potassium (mmol/L)  Date Value  12/05/2022 5.0   Magnesium (mg/dL)  Date Value  16/11/9602 2.2   Calcium (mg/dL)  Date Value  54/10/8117 8.3 (L)   Albumin (g/dL)  Date Value  14/78/2956 3.3 (L)   Phosphorus (mg/dL)  Date Value  21/30/8657 4.5   Sodium (mmol/L)  Date Value  12/05/2022 137    Assessment  Jane Griffin is a 82 y.o. female presenting with chest tightness, concerning for ACS. PMH significant for presyncope and left breast cancer s/p excision 2023 XRT. Pharmacy has been consulted to monitor and replace electrolytes.  Diet: Cardiac MIVF: N/A Pertinent medications: N/A  Goal of Therapy: Electrolytes WNL  Plan:  No electrolyte replacement warranted currently Check BMP, Mg, Phos with AM labs  Thank you for allowing pharmacy to be a part of this patient's care.  Will M. Dareen Piano, PharmD Clinical Pharmacist 12/05/2022 9:04 AM

## 2022-12-05 NOTE — H&P (Addendum)
History and Physical    Patient: Jane Griffin:[id number] DOB: [birth year] DOA: 12/04/2022 DOS: the patient was seen and examined on 12/05/2022 PCP: Jane Regulus, MD  Patient coming from: Home  Chief Complaint:  Chief Complaint  Patient presents with   Abnormal Labs    HPI: Jane Griffin is a 82 y.o. female with medical history significant for Left breast cancer s/p excision 2023 XRT on maintenance tamoxifen, with past history of presyncope back in 2019 with an extensive cardiac workup at that time, reevaluated recently on 11/27/2022 for palpitations and presyncope who presents to the ED with abnormal labs.  At her visit, further workup was ordered including echo, Holter and CBC and CMP.  Labs are concerning for anemia and several electrolyte abnormalities reason for which she was sent to the ED. Patient endorses having a cough for several weeks as well as exertional shortness of breath and chest tightness.  She denies lower extremity pain or swelling.  Son at bedside states that patient is grieving the loss of her Jane Griffin from Jane Griffin.  Son states that his Jane Griffin was his sisters primary caregiver and taking care of her gave her a sense of purpose ED course and data review: BP elevated to 164/75 with otherwise normal vitals. Troponin 79-1 06 CBC notable for hemoglobin of 9.5, down from a baseline of 11 around 10 months prior Electrolyte abnormalities including potassium 2.9, magnesium 1.4, calcium 6.2 (pending albumin, hepatic function panel) Creatinine 1.45 which appears to be about baseline with bicarb 16 and normal anion gap EKG, personally viewed and interpreted showing sinus at 82 with RBBB CTA chest with no acute abnormality Patient given IV potassium magnesium and calcium repletion Started on heparin for NSTEMI Hospitalist consulted for admission.   Review of Systems: As mentioned in the history of present illness. All other systems reviewed and are negative.  Past  Medical History:  Diagnosis Date   Allergic rhinitis    Arthritis    joints   Cancer (HCC)    Cervical disc disease    Chronic kidney disease    STAGE 3   GERD (gastroesophageal reflux disease)    H/O blood clots    around her 20's   History of asthma    History of colon polyps    History of gout    Hyperlipidemia    Hypertension    PONV (postoperative nausea and vomiting)    nausea   Shingles    Past Surgical History:  Procedure Laterality Date   ABDOMINAL HYSTERECTOMY     BREAST BIOPSY Left 10/26/2021   [place] Core Bx ribbon clip positive   BREAST LUMPECTOMY Left 11/11/2021   Procedure: BREAST LUMPECTOMY;  Surgeon: Earline Mayotte, MD;  Location: [facility] ORS;  Service: General;  Laterality: Left;   BREAST LUMPECTOMY Left 11/11/2021   invasive lobular ca/neg margins/closest margin .3 mm   COLONOSCOPY  2013   COLONOSCOPY WITH PROPOFOL N/A 10/11/2016   Procedure: COLONOSCOPY WITH PROPOFOL;  Surgeon: Kieth Brightly, MD;  Location: [facility] END[facility]COPY;  Service: Endoscopy;  Laterality: N/A;   DISTAL INTERPHALANGEAL JOINT FUSION Right 08/08/2022   Procedure: Right middle finger DISTAL INTERPHALANGEAL JOINT FUSION;  Surgeon: Kennedy Bucker, MD;  Location: [facility];  Service: Orthopedics;  Laterality: Right;   ELBOW SURGERY Left    EYE SURGERY     both eyes cataract removed   FEMORAL HERNIA REPAIR Right 09/18/2015   Procedure: HERNIA REPAIR femoral  ADULT;  Surgeon: Kieth Brightly, MD;  Location: [facility] ORS;  Service: General;  Laterality: Right;   HERNIA REPAIR     LAPAROSCOPIC BILATERAL SALPINGO OOPHERECTOMY Bilateral 09/06/2015   Procedure: LAPAROSCOPIC BILATERAL SALPINGO OOPHORECTOMY;  Surgeon: Suzy Bouchard, MD;  Location: [facility] ORS;  Service: Gynecology;  Laterality: Bilateral;   PARTIAL HYSTERECTOMY     TUBAL LIGATION     UPPER GI ENDOSCOPY     WRIST SURGERY Right    Social History:  reports that she has never smoked. She has never used smokeless  tobacco. She reports that she does not drink alcohol and does not use drugs.  Allergies  Allergen Reactions   Codeine Nausea Only   Gabapentin     nightmares   Lipitor [Atorvastatin]     Aches    Pravachol [Pravastatin Sodium]     aches   Tramadol     nausea   Aleve [Naproxen Sodium] Rash   Sulfa Antibiotics Itching and Rash   Sulfasalazine Itching and Rash    Family History  Problem Relation Age of Onset   Clotting disorder Jane Griffin    Heart failure Jane Griffin    Emphysema Jane Griffin    Heart failure Jane Griffin    Cancer Jane Griffin        breast cancer   Breast cancer Jane Griffin    Prior to Admission medications   Medication Sig Start Date End Date Taking? Authorizing Provider  acetaminophen (TYLENOL) 500 MG tablet Take 500 mg by mouth every 6 (six) hours as needed for mild pain.    [provider]  alendronate (FOSAMAX) 70 MG tablet Take 1 tablet (70 mg total) by mouth once a week. Take with a full glass of water on an empty stomach. 01/09/22   Jeralyn Ruths, MD  clindamycin (CLEOCIN) 300 MG capsule Take 300 mg by mouth 3 (three) times daily.    [provider]  Cyanocobalamin (VITAMIN B-12 IJ) Inject as directed every 30 (thirty) days.    [provider]  Febuxostat 80 MG TABS Take 80 mg by mouth daily. 09/01/22 08/27/23  [provider]  fluvastatin XL (LESCOL XL) 80 MG 24 hr tablet Take 80 mg by mouth daily. 06/10/15   [provider]  HYDROcodone-acetaminophen (NORCO/VICODIN) 5-325 MG tablet Take 1 tablet by mouth every 4 (four) hours as needed for moderate pain. Patient not taking: Reported on 09/25/2022 08/08/22 08/08/23  Kennedy Bucker, MD  olmesartan-hydrochlorothiazide (BENICAR HCT) 40-12.5 MG tablet Take 1 tablet by mouth daily. 05/06/18   [provider]  pantoprazole (PROTONIX) 40 MG tablet TAKE 1 TABLET BY MOUTH ONCE A DAY 30-60 MINUTES BEFORE FIRST MEAL OF THE DAY 02/03/13   Nyoka Cowden, MD  tamoxifen (NOLVADEX) 20 MG  tablet TAKE 1 TABLET BY MOUTH EVERY DAY 08/14/22   Jeralyn Ruths, MD    Physical Exam: Vitals:   12/04/22 2200 12/04/22 2230 12/04/22 2300 12/04/22 2330  BP: (!) 147/102 (!) 162/79 (!) 162/81 (!) 164/85  Pulse: 69 69 69 78  Resp: (!) 23 15 15 13   Temp:      TempSrc:      SpO2: 99% 98% 99% 100%  Weight:      Height:       Physical Exam Vitals and nursing note reviewed.  Constitutional:      General: She is not in acute distress. HENT:     Head: Normocephalic and atraumatic.  Cardiovascular:     Rate and Rhythm: Normal rate and regular rhythm.     Heart sounds: Normal  heart sounds.  Pulmonary:     Effort: Pulmonary effort is normal.     Breath sounds: Normal breath sounds.  Abdominal:     Palpations: Abdomen is soft.     Tenderness: There is no abdominal tenderness.  Neurological:     Mental Status: Mental status is at baseline.     Labs on Admission: I have personally reviewed following labs and imaging studies  CBC: Recent Labs  Lab 12/04/22 1851  WBC 7.1  HGB 9.5*  HCT 29.9*  MCV 93.4  PLT 211   Basic Metabolic Panel: Recent Labs  Lab 12/04/22 1826 12/04/22 1851 12/04/22 2056  NA  --  142  --   K  --  2.9*  --   CL  --  120*  --   CO2  --  16*  --   GLUCOSE  --  81  --   BUN  --  20  --   CREATININE 2.30* 1.45*  --   CALCIUM  --  6.2*  --   MG  --   --  1.4*  PHOS  --   --  4.5   GFR: Estimated Creatinine Clearance: 24.5 mL/min (A) (by C-G formula based on SCr of 1.45 mg/dL (H)). Liver Function Tests: No results for input(s): "AST", "ALT", "ALKPHOS", "BILITOT", "PROT", "ALBUMIN" in the last 168 hours. No results for input(s): "LIPASE", "AMYLASE" in the last 168 hours. No results for input(s): "AMMONIA" in the last 168 hours. Coagulation Profile: Recent Labs  Lab 12/05/22 0020  INR 1.1   Cardiac Enzymes: No results for input(s): "CKTOTAL", "CKMB", "CKMBINDEX", "TROPONINI" in the last 168 hours. BNP (last 3 results) No results for  input(s): "PROBNP" in the last 8760 hours. HbA1C: No results for input(s): "HGBA1C" in the last 72 hours. CBG: No results for input(s): "GLUCAP" in the last 168 hours. Lipid Profile: No results for input(s): "CHOL", "HDL", "LDLCALC", "TRIG", "CHOLHDL", "LDLDIRECT" in the last 72 hours. Thyroid Function Tests: No results for input(s): "TSH", "T4TOTAL", "FREET4", "T3FREE", "THYROIDAB" in the last 72 hours. Anemia Panel: No results for input(s): "VITAMINB12", "FOLATE", "FERRITIN", "TIBC", "IRON", "RETICCTPCT" in the last 72 hours. Urine analysis: No results found for: "COLORURINE", "APPEARANCEUR", "LABSPEC", "PHURINE", "GLUCOSEU", "HGBUR", "BILIRUBINUR", "KETONESUR", "PROTEINUR", "UROBILINOGEN", "NITRITE", "LEUKOCYTESUR"  Radiological Exams on Admission: CT Angio Chest PE W and/or Wo Contrast  Result Date: 12/04/2022 CLINICAL DATA:  Chest tightness EXAM: CT ANGIOGRAPHY CHEST WITH CONTRAST TECHNIQUE: Multidetector CT imaging of the chest was performed using the standard protocol during bolus administration of intravenous contrast. Multiplanar CT image reconstructions and MIPs were obtained to evaluate the vascular anatomy. RADIATION DOSE REDUCTION: This exam was performed according to the departmental dose-optimization program which includes automated exposure control, adjustment of the mA and/or kV according to patient size and/or use of iterative reconstruction technique. CONTRAST:  60mL OMNIPAQUE IOHEXOL 350 MG/ML SOLN COMPARISON:  Chest x-ray 12/04/2022, CT chest 06/28/2011 FINDINGS: Cardiovascular: Satisfactory opacification of the pulmonary arteries to the segmental level. No evidence of pulmonary embolism. Nonaneurysmal aorta. Mild atherosclerosis. No dissection is seen. Coronary vascular calcification. Normal cardiac size. No pericardial effusion Mediastinum/Nodes: Midline trachea. No thyroid mass. No suspicious lymph nodes. Esophagus within normal limits. Lungs/Pleura: Calcified granuloma in  the right upper lobe. No acute airspace disease, pleural effusion or pneumothorax. Subpleural density in the left upper lobe most likely post radiation changes. Upper Abdomen: No acute finding Musculoskeletal: Mild to moderate compression deformity at T12 of uncertain age. Review of the MIP images confirms the above  findings. IMPRESSION: 1. Negative for acute pulmonary embolus or aortic dissection. 2. No CT evidence for acute intrathoracic abnormality. 3. Mild to moderate compression deformity at T12 of uncertain age. 4. Mild fibrosis and scarring in the subpleural left upper lobe. 5. Aortic atherosclerosis. Aortic Atherosclerosis (ICD10-I70.0). Electronically Signed   By: Jasmine Pang M.D.   On: 12/04/2022 22:52   DG Chest 2 View  Result Date: 12/04/2022 CLINICAL DATA:  Chest pain EXAM: CHEST - 2 VIEW COMPARISON:  11/27/2022 report FINDINGS: Streaky left mid to lower lung opacity. No pleural effusion. Normal cardiac size. No pneumothorax IMPRESSION: Streaky left mid to lower lung opacity, atelectasis versus pneumonia. Electronically Signed   By: Jasmine Pang M.D.   On: 12/04/2022 22:46     Data Reviewed: Relevant notes from primary care and specialist visits, past discharge summaries as available in EHR, including Care Everywhere. Prior diagnostic testing as pertinent to current admission diagnoses Updated medications and problem lists for reconciliation ED course, including vitals, labs, imaging, treatment and response to treatment Triage notes, nursing and pharmacy notes and ED provider's notes Notable results as noted in HPI   Assessment and Plan: * NSTEMI (non-ST elevated myocardial infarction) (HCC) Possible demand ischemia Palpitations and recurrent presyncope patient with dyspnea on exertion occasional chest tightness and palpitations with troponin 79-1 06 Continue heparin infusion started in the ED Continuous cardiac monitoring Echocardiogram Aspirin, beta-blocker and continue  [facility] consult   Postural dizziness with presyncope Possibly related to electrolyte disturbance versus NSTEMI or combination of both Neurologic checks Fall precautions Syncope workup with continuous cardiac monitoring to evaluate for arrhythmia, and echocardiogram and carotid Doppler Correct electrolyte disturbances-see related problems Hold diuretic  Hypocalcemia Repleted with calcium gluconate in the ED Continue to monitor and correct as needed Follow-up albumin   Hypokalemia Possibly related to thiazide diuretic Received IV and oral repletion in the ED Continue to replete the potassium over 4 review of history of palpitations Hold thiazide  Hypomagnesemia Received IV repletion in the ED Continue to monitor and replete as needed to mag over 2  Grief at loss of child Patient lost her Jane Griffin to Jane Griffin a month prior and son states she has been grieving  Cough CTA chest was nonacute Will get COVID screen  Chronic kidney disease Metabolic acidosis Continue to monitor Consider nephrology consult  Anemia Hemoglobin 9.5 down from 11 several months prior Anemia panel and continue to monitor  Invasive ductal carcinoma of left breast [facility]) S/p resection and XRT Continue tamoxifen     DVT prophylaxis: Lovenox  Consults: cardiology, [facility]  Advance Care Planning:   Code Status: Prior   Family Communication: Son at bedside  Disposition Plan: Back to previous home environment  Severity of Illness: The appropriate patient status for this patient is INPATIENT. Inpatient status is judged to be reasonable and necessary in order to provide the required intensity of service to ensure the patient's safety. The patient's presenting symptoms, physical exam findings, and initial radiographic and laboratory data in the context of their chronic comorbidities is felt to place them at high risk for further clinical deterioration. Furthermore, it is not anticipated that the  patient will be medically stable for discharge from the hospital within 2 midnights of admission.   * I certify that at the point of admission it is my clinical judgment that the patient will require inpatient hospital care spanning beyond 2 midnights from the point of admission due to high intensity of service, high risk for further deterioration and  high frequency of surveillance required.*  Author: Andris Baumann, MD 12/05/2022 1:10 AM  For on call review [email or web address].

## 2022-12-05 NOTE — Progress Notes (Signed)
*  PRELIMINARY RESULTS* Echocardiogram 2D Echocardiogram has been performed.  Jane Griffin 12/05/2022, 11:33 AM

## 2022-12-05 NOTE — Assessment & Plan Note (Signed)
CTA chest was nonacute Will get COVID screen

## 2022-12-05 NOTE — Consult Note (Signed)
111.0 lb Date of Birth:  Apr 04, 1940     BSA:          1.488 m Patient Age:    82 years      BP:           145/64 mmHg  Patient Gender: F             HR:           74 bpm. Exam Location:  [facility] Procedure: 2D Echo, Cardiac Doppler and Color Doppler Indications:     NSTEMI  History:         Patient has no prior history of Echocardiogram examinations.                  Signs/Symptoms:Dizziness/Lightheadedness. CKD, Breast CA.  Sonographer:     Mikki Harbor Referring Phys:  [id number] Andris Baumann Diagnosing Phys: Mellody Drown Alluri IMPRESSIONS  1. Left ventricular ejection fraction, by estimation, is 50 to 55%. The left ventricle has normal function. The left ventricle has no regional wall motion abnormalities. Left ventricular diastolic parameters are consistent with Grade II diastolic dysfunction (pseudonormalization).  2. Right ventricular systolic function is normal. The right ventricular size is normal. There is moderately elevated pulmonary artery systolic pressure. The estimated right ventricular systolic pressure is 50.3 mmHg.  3. Left atrial size was mildly dilated.  4. Moderate mitral valve regurgitation.  5. Tricuspid valve regurgitation is moderate.  6. The aortic valve is tricuspid. Aortic valve regurgitation is trivial.  7. The inferior vena cava is normal in size with greater than 50% respiratory variability, suggesting right atrial pressure of 3 mmHg. FINDINGS  Left Ventricle: Left ventricular ejection fraction, by estimation, is 50 to 55%. The left ventricle has normal function. The left ventricle has no regional wall motion abnormalities. The left ventricular internal cavity size was normal in size. There is  no left ventricular hypertrophy. Left ventricular diastolic parameters are consistent with Grade II diastolic dysfunction (pseudonormalization). Right Ventricle: The right ventricular size is normal. No increase in right ventricular wall thickness. Right ventricular systolic function is normal. There is moderately elevated pulmonary artery systolic pressure. The tricuspid regurgitant velocity is 3.44 m/s, and with an  assumed right atrial pressure of 3 mmHg, the estimated right ventricular systolic pressure is 50.3 mmHg. Left Atrium: Left atrial size was mildly dilated. Right Atrium: Right atrial size was normal in size. Pericardium: There is no evidence of pericardial effusion. Mitral Valve: There is mild thickening of the mitral valve leaflet(s). Mild mitral annular calcification. Moderate mitral valve regurgitation. MV peak gradient, 6.1 mmHg. The mean mitral valve gradient is 2.0 mmHg. Tricuspid Valve: Pulmonary HTN with RVSP 50 mmHg. The tricuspid valve is normal in structure. Tricuspid valve regurgitation is moderate. Aortic Valve: The aortic valve is tricuspid. Aortic valve regurgitation is trivial. Aortic valve mean gradient measures 4.0 mmHg. Aortic valve peak gradient measures 9.2 mmHg. Aortic valve area, by VTI measures 2.82 cm. Pulmonic Valve: The pulmonic valve was not well visualized. Pulmonic valve regurgitation is trivial. Aorta: The aortic root and ascending aorta are structurally normal, with no evidence of dilitation. Venous: The inferior vena cava is normal in size with greater than 50% respiratory variability, suggesting right atrial pressure of 3 mmHg. IAS/Shunts: The interatrial septum was not assessed.  LEFT VENTRICLE PLAX 2D LVIDd:         4.60 cm LVIDs:         2.80 cm LV PW:  [facility] CLINIC CARDIOLOGY CONSULT NOTE       Patient ID: Jane Griffin MRN: [id number] DOB/AGE: [birth year] 82 y.o.  Admit date: 12/04/2022 Referring Physician Lindajo Royal, MD Primary Physician Lauro Regulus, MD Primary Cardiologist Leanora Ivanoff, PA Reason for Consultation NSTEMI  HPI: Jane Griffin is a 82 y.o. female  with a past medical history of presyncope, hypertension, hyperlipidemia, breast cancer s/p left breast wide excision (tamoxifen), chronic kidney disease  who presented to the ED on 12/04/2022 for shortness of breath and chest tightness. Cardiology was consulted for further evaluation.   Patient has been having shortness of breath and chest tightness that is exacerbated with cough for three weeks, PCP checked D-dimer which was elevated and ordered outpatient CT for evaluation of PE. Prior to outpatient CT yesterday, POC creatinine was found to be elevated and she was sent to the ED for further evaluation. Upon evaluation at the ED, patient was hypertensive with stable HR.   Work up in the ED notable for CTA with no evidence of pulmonary embolism. CXR with left mid to lower opacity, she was recently treated for PNA outpatient. Na 142, K 2.9, Mg 1.4 Cr 1.45, GFR 36, Ca 6.2, Hgb 9.5.Troponin trended 79 > 106 > 91. EKG with NSR RBBB, stable and nonacute. Patients electrolytes were repleted in the ED. Patient received 325 mg ASA. Heparin infusion started after troponins found to be mildly elevated.     At the time of my evaluation this morning, patient was laying comfortably in her hospital bed at an incline with family at bedside. Patient states she's been feeling short of breath for the past 2-3 weeks and has noticed it's gotten worse. Patient state she was diagnosed with pneumonia and recently finished a course of azithromycin. Denies any recent fever or chills. She says she's had a "chronic cough for years" and says her cough from the pneumonia seems to be the same. Patient  reports she noticed her SOB on exertion with walking, which is newer for the patient. Patient reports a few episodes of "heart racing" when she's laying on her side at night, last episode 2 days ago. Patient reports she has intermittent episodes of chest tightness. This is mostly associated with her cough but she also endorses a few episodes with exertion.   Of note, patient recently saw Leanora Ivanoff (primary cardiology PA) regarding "lightheadedness" after standing up in church 1 week ago. Patient was seen on 11/27/2022 for this pre-syncope episode. Patient was placed on 72 hr holter monitor and patient states she returned holter a few days ago for evaluation. Patient states she has a history of syncope that occurred "years ago". Denies any recurrence of near-syncope since initial episode one week ago.   Review of systems complete and found to be negative unless listed above    Past Medical History:  Diagnosis Date   Allergic rhinitis    Arthritis    joints   Cancer (HCC)    Cervical disc disease    Chronic kidney disease    STAGE 3   GERD (gastroesophageal reflux disease)    H/O blood clots    around her 20's   History of asthma    History of colon polyps    History of gout    Hyperlipidemia    Hypertension    PONV (postoperative nausea and vomiting)    nausea   Shingles     Past Surgical History:  Procedure Laterality Date   ABDOMINAL HYSTERECTOMY  [facility] CLINIC CARDIOLOGY CONSULT NOTE       Patient ID: Jane Griffin MRN: [id number] DOB/AGE: [birth year] 82 y.o.  Admit date: 12/04/2022 Referring Physician Lindajo Royal, MD Primary Physician Lauro Regulus, MD Primary Cardiologist Leanora Ivanoff, PA Reason for Consultation NSTEMI  HPI: Jane Griffin is a 82 y.o. female  with a past medical history of presyncope, hypertension, hyperlipidemia, breast cancer s/p left breast wide excision (tamoxifen), chronic kidney disease  who presented to the ED on 12/04/2022 for shortness of breath and chest tightness. Cardiology was consulted for further evaluation.   Patient has been having shortness of breath and chest tightness that is exacerbated with cough for three weeks, PCP checked D-dimer which was elevated and ordered outpatient CT for evaluation of PE. Prior to outpatient CT yesterday, POC creatinine was found to be elevated and she was sent to the ED for further evaluation. Upon evaluation at the ED, patient was hypertensive with stable HR.   Work up in the ED notable for CTA with no evidence of pulmonary embolism. CXR with left mid to lower opacity, she was recently treated for PNA outpatient. Na 142, K 2.9, Mg 1.4 Cr 1.45, GFR 36, Ca 6.2, Hgb 9.5.Troponin trended 79 > 106 > 91. EKG with NSR RBBB, stable and nonacute. Patients electrolytes were repleted in the ED. Patient received 325 mg ASA. Heparin infusion started after troponins found to be mildly elevated.     At the time of my evaluation this morning, patient was laying comfortably in her hospital bed at an incline with family at bedside. Patient states she's been feeling short of breath for the past 2-3 weeks and has noticed it's gotten worse. Patient state she was diagnosed with pneumonia and recently finished a course of azithromycin. Denies any recent fever or chills. She says she's had a "chronic cough for years" and says her cough from the pneumonia seems to be the same. Patient  reports she noticed her SOB on exertion with walking, which is newer for the patient. Patient reports a few episodes of "heart racing" when she's laying on her side at night, last episode 2 days ago. Patient reports she has intermittent episodes of chest tightness. This is mostly associated with her cough but she also endorses a few episodes with exertion.   Of note, patient recently saw Leanora Ivanoff (primary cardiology PA) regarding "lightheadedness" after standing up in church 1 week ago. Patient was seen on 11/27/2022 for this pre-syncope episode. Patient was placed on 72 hr holter monitor and patient states she returned holter a few days ago for evaluation. Patient states she has a history of syncope that occurred "years ago". Denies any recurrence of near-syncope since initial episode one week ago.   Review of systems complete and found to be negative unless listed above    Past Medical History:  Diagnosis Date   Allergic rhinitis    Arthritis    joints   Cancer (HCC)    Cervical disc disease    Chronic kidney disease    STAGE 3   GERD (gastroesophageal reflux disease)    H/O blood clots    around her 20's   History of asthma    History of colon polyps    History of gout    Hyperlipidemia    Hypertension    PONV (postoperative nausea and vomiting)    nausea   Shingles     Past Surgical History:  Procedure Laterality Date   ABDOMINAL HYSTERECTOMY  [facility] CLINIC CARDIOLOGY CONSULT NOTE       Patient ID: Jane Griffin MRN: [id number] DOB/AGE: [birth year] 82 y.o.  Admit date: 12/04/2022 Referring Physician Lindajo Royal, MD Primary Physician Lauro Regulus, MD Primary Cardiologist Leanora Ivanoff, PA Reason for Consultation NSTEMI  HPI: Jane Griffin is a 82 y.o. female  with a past medical history of presyncope, hypertension, hyperlipidemia, breast cancer s/p left breast wide excision (tamoxifen), chronic kidney disease  who presented to the ED on 12/04/2022 for shortness of breath and chest tightness. Cardiology was consulted for further evaluation.   Patient has been having shortness of breath and chest tightness that is exacerbated with cough for three weeks, PCP checked D-dimer which was elevated and ordered outpatient CT for evaluation of PE. Prior to outpatient CT yesterday, POC creatinine was found to be elevated and she was sent to the ED for further evaluation. Upon evaluation at the ED, patient was hypertensive with stable HR.   Work up in the ED notable for CTA with no evidence of pulmonary embolism. CXR with left mid to lower opacity, she was recently treated for PNA outpatient. Na 142, K 2.9, Mg 1.4 Cr 1.45, GFR 36, Ca 6.2, Hgb 9.5.Troponin trended 79 > 106 > 91. EKG with NSR RBBB, stable and nonacute. Patients electrolytes were repleted in the ED. Patient received 325 mg ASA. Heparin infusion started after troponins found to be mildly elevated.     At the time of my evaluation this morning, patient was laying comfortably in her hospital bed at an incline with family at bedside. Patient states she's been feeling short of breath for the past 2-3 weeks and has noticed it's gotten worse. Patient state she was diagnosed with pneumonia and recently finished a course of azithromycin. Denies any recent fever or chills. She says she's had a "chronic cough for years" and says her cough from the pneumonia seems to be the same. Patient  reports she noticed her SOB on exertion with walking, which is newer for the patient. Patient reports a few episodes of "heart racing" when she's laying on her side at night, last episode 2 days ago. Patient reports she has intermittent episodes of chest tightness. This is mostly associated with her cough but she also endorses a few episodes with exertion.   Of note, patient recently saw Leanora Ivanoff (primary cardiology PA) regarding "lightheadedness" after standing up in church 1 week ago. Patient was seen on 11/27/2022 for this pre-syncope episode. Patient was placed on 72 hr holter monitor and patient states she returned holter a few days ago for evaluation. Patient states she has a history of syncope that occurred "years ago". Denies any recurrence of near-syncope since initial episode one week ago.   Review of systems complete and found to be negative unless listed above    Past Medical History:  Diagnosis Date   Allergic rhinitis    Arthritis    joints   Cancer (HCC)    Cervical disc disease    Chronic kidney disease    STAGE 3   GERD (gastroesophageal reflux disease)    H/O blood clots    around her 20's   History of asthma    History of colon polyps    History of gout    Hyperlipidemia    Hypertension    PONV (postoperative nausea and vomiting)    nausea   Shingles     Past Surgical History:  Procedure Laterality Date   ABDOMINAL HYSTERECTOMY  111.0 lb Date of Birth:  Apr 04, 1940     BSA:          1.488 m Patient Age:    82 years      BP:           145/64 mmHg  Patient Gender: F             HR:           74 bpm. Exam Location:  [facility] Procedure: 2D Echo, Cardiac Doppler and Color Doppler Indications:     NSTEMI  History:         Patient has no prior history of Echocardiogram examinations.                  Signs/Symptoms:Dizziness/Lightheadedness. CKD, Breast CA.  Sonographer:     Mikki Harbor Referring Phys:  [id number] Andris Baumann Diagnosing Phys: Mellody Drown Alluri IMPRESSIONS  1. Left ventricular ejection fraction, by estimation, is 50 to 55%. The left ventricle has normal function. The left ventricle has no regional wall motion abnormalities. Left ventricular diastolic parameters are consistent with Grade II diastolic dysfunction (pseudonormalization).  2. Right ventricular systolic function is normal. The right ventricular size is normal. There is moderately elevated pulmonary artery systolic pressure. The estimated right ventricular systolic pressure is 50.3 mmHg.  3. Left atrial size was mildly dilated.  4. Moderate mitral valve regurgitation.  5. Tricuspid valve regurgitation is moderate.  6. The aortic valve is tricuspid. Aortic valve regurgitation is trivial.  7. The inferior vena cava is normal in size with greater than 50% respiratory variability, suggesting right atrial pressure of 3 mmHg. FINDINGS  Left Ventricle: Left ventricular ejection fraction, by estimation, is 50 to 55%. The left ventricle has normal function. The left ventricle has no regional wall motion abnormalities. The left ventricular internal cavity size was normal in size. There is  no left ventricular hypertrophy. Left ventricular diastolic parameters are consistent with Grade II diastolic dysfunction (pseudonormalization). Right Ventricle: The right ventricular size is normal. No increase in right ventricular wall thickness. Right ventricular systolic function is normal. There is moderately elevated pulmonary artery systolic pressure. The tricuspid regurgitant velocity is 3.44 m/s, and with an  assumed right atrial pressure of 3 mmHg, the estimated right ventricular systolic pressure is 50.3 mmHg. Left Atrium: Left atrial size was mildly dilated. Right Atrium: Right atrial size was normal in size. Pericardium: There is no evidence of pericardial effusion. Mitral Valve: There is mild thickening of the mitral valve leaflet(s). Mild mitral annular calcification. Moderate mitral valve regurgitation. MV peak gradient, 6.1 mmHg. The mean mitral valve gradient is 2.0 mmHg. Tricuspid Valve: Pulmonary HTN with RVSP 50 mmHg. The tricuspid valve is normal in structure. Tricuspid valve regurgitation is moderate. Aortic Valve: The aortic valve is tricuspid. Aortic valve regurgitation is trivial. Aortic valve mean gradient measures 4.0 mmHg. Aortic valve peak gradient measures 9.2 mmHg. Aortic valve area, by VTI measures 2.82 cm. Pulmonic Valve: The pulmonic valve was not well visualized. Pulmonic valve regurgitation is trivial. Aorta: The aortic root and ascending aorta are structurally normal, with no evidence of dilitation. Venous: The inferior vena cava is normal in size with greater than 50% respiratory variability, suggesting right atrial pressure of 3 mmHg. IAS/Shunts: The interatrial septum was not assessed.  LEFT VENTRICLE PLAX 2D LVIDd:         4.60 cm LVIDs:         2.80 cm LV PW:  111.0 lb Date of Birth:  Apr 04, 1940     BSA:          1.488 m Patient Age:    82 years      BP:           145/64 mmHg  Patient Gender: F             HR:           74 bpm. Exam Location:  [facility] Procedure: 2D Echo, Cardiac Doppler and Color Doppler Indications:     NSTEMI  History:         Patient has no prior history of Echocardiogram examinations.                  Signs/Symptoms:Dizziness/Lightheadedness. CKD, Breast CA.  Sonographer:     Mikki Harbor Referring Phys:  [id number] Andris Baumann Diagnosing Phys: Mellody Drown Alluri IMPRESSIONS  1. Left ventricular ejection fraction, by estimation, is 50 to 55%. The left ventricle has normal function. The left ventricle has no regional wall motion abnormalities. Left ventricular diastolic parameters are consistent with Grade II diastolic dysfunction (pseudonormalization).  2. Right ventricular systolic function is normal. The right ventricular size is normal. There is moderately elevated pulmonary artery systolic pressure. The estimated right ventricular systolic pressure is 50.3 mmHg.  3. Left atrial size was mildly dilated.  4. Moderate mitral valve regurgitation.  5. Tricuspid valve regurgitation is moderate.  6. The aortic valve is tricuspid. Aortic valve regurgitation is trivial.  7. The inferior vena cava is normal in size with greater than 50% respiratory variability, suggesting right atrial pressure of 3 mmHg. FINDINGS  Left Ventricle: Left ventricular ejection fraction, by estimation, is 50 to 55%. The left ventricle has normal function. The left ventricle has no regional wall motion abnormalities. The left ventricular internal cavity size was normal in size. There is  no left ventricular hypertrophy. Left ventricular diastolic parameters are consistent with Grade II diastolic dysfunction (pseudonormalization). Right Ventricle: The right ventricular size is normal. No increase in right ventricular wall thickness. Right ventricular systolic function is normal. There is moderately elevated pulmonary artery systolic pressure. The tricuspid regurgitant velocity is 3.44 m/s, and with an  assumed right atrial pressure of 3 mmHg, the estimated right ventricular systolic pressure is 50.3 mmHg. Left Atrium: Left atrial size was mildly dilated. Right Atrium: Right atrial size was normal in size. Pericardium: There is no evidence of pericardial effusion. Mitral Valve: There is mild thickening of the mitral valve leaflet(s). Mild mitral annular calcification. Moderate mitral valve regurgitation. MV peak gradient, 6.1 mmHg. The mean mitral valve gradient is 2.0 mmHg. Tricuspid Valve: Pulmonary HTN with RVSP 50 mmHg. The tricuspid valve is normal in structure. Tricuspid valve regurgitation is moderate. Aortic Valve: The aortic valve is tricuspid. Aortic valve regurgitation is trivial. Aortic valve mean gradient measures 4.0 mmHg. Aortic valve peak gradient measures 9.2 mmHg. Aortic valve area, by VTI measures 2.82 cm. Pulmonic Valve: The pulmonic valve was not well visualized. Pulmonic valve regurgitation is trivial. Aorta: The aortic root and ascending aorta are structurally normal, with no evidence of dilitation. Venous: The inferior vena cava is normal in size with greater than 50% respiratory variability, suggesting right atrial pressure of 3 mmHg. IAS/Shunts: The interatrial septum was not assessed.  LEFT VENTRICLE PLAX 2D LVIDd:         4.60 cm LVIDs:         2.80 cm LV PW:  111.0 lb Date of Birth:  Apr 04, 1940     BSA:          1.488 m Patient Age:    82 years      BP:           145/64 mmHg  Patient Gender: F             HR:           74 bpm. Exam Location:  [facility] Procedure: 2D Echo, Cardiac Doppler and Color Doppler Indications:     NSTEMI  History:         Patient has no prior history of Echocardiogram examinations.                  Signs/Symptoms:Dizziness/Lightheadedness. CKD, Breast CA.  Sonographer:     Mikki Harbor Referring Phys:  [id number] Andris Baumann Diagnosing Phys: Mellody Drown Alluri IMPRESSIONS  1. Left ventricular ejection fraction, by estimation, is 50 to 55%. The left ventricle has normal function. The left ventricle has no regional wall motion abnormalities. Left ventricular diastolic parameters are consistent with Grade II diastolic dysfunction (pseudonormalization).  2. Right ventricular systolic function is normal. The right ventricular size is normal. There is moderately elevated pulmonary artery systolic pressure. The estimated right ventricular systolic pressure is 50.3 mmHg.  3. Left atrial size was mildly dilated.  4. Moderate mitral valve regurgitation.  5. Tricuspid valve regurgitation is moderate.  6. The aortic valve is tricuspid. Aortic valve regurgitation is trivial.  7. The inferior vena cava is normal in size with greater than 50% respiratory variability, suggesting right atrial pressure of 3 mmHg. FINDINGS  Left Ventricle: Left ventricular ejection fraction, by estimation, is 50 to 55%. The left ventricle has normal function. The left ventricle has no regional wall motion abnormalities. The left ventricular internal cavity size was normal in size. There is  no left ventricular hypertrophy. Left ventricular diastolic parameters are consistent with Grade II diastolic dysfunction (pseudonormalization). Right Ventricle: The right ventricular size is normal. No increase in right ventricular wall thickness. Right ventricular systolic function is normal. There is moderately elevated pulmonary artery systolic pressure. The tricuspid regurgitant velocity is 3.44 m/s, and with an  assumed right atrial pressure of 3 mmHg, the estimated right ventricular systolic pressure is 50.3 mmHg. Left Atrium: Left atrial size was mildly dilated. Right Atrium: Right atrial size was normal in size. Pericardium: There is no evidence of pericardial effusion. Mitral Valve: There is mild thickening of the mitral valve leaflet(s). Mild mitral annular calcification. Moderate mitral valve regurgitation. MV peak gradient, 6.1 mmHg. The mean mitral valve gradient is 2.0 mmHg. Tricuspid Valve: Pulmonary HTN with RVSP 50 mmHg. The tricuspid valve is normal in structure. Tricuspid valve regurgitation is moderate. Aortic Valve: The aortic valve is tricuspid. Aortic valve regurgitation is trivial. Aortic valve mean gradient measures 4.0 mmHg. Aortic valve peak gradient measures 9.2 mmHg. Aortic valve area, by VTI measures 2.82 cm. Pulmonic Valve: The pulmonic valve was not well visualized. Pulmonic valve regurgitation is trivial. Aorta: The aortic root and ascending aorta are structurally normal, with no evidence of dilitation. Venous: The inferior vena cava is normal in size with greater than 50% respiratory variability, suggesting right atrial pressure of 3 mmHg. IAS/Shunts: The interatrial septum was not assessed.  LEFT VENTRICLE PLAX 2D LVIDd:         4.60 cm LVIDs:         2.80 cm LV PW:

## 2022-12-05 NOTE — ED Notes (Signed)
Patient is resting comfortably.family at bedside.

## 2022-12-05 NOTE — Progress Notes (Signed)
ANTICOAGULATION CONSULT NOTE  Pharmacy Consult for heparin infusion Indication: ACS/STEMI  Allergies  Allergen Reactions   Codeine Nausea Only   Gabapentin     nightmares   Lipitor [Atorvastatin]     Aches    Pravachol [Pravastatin Sodium]     aches   Tramadol     nausea   Aleve [Naproxen Sodium] Rash   Sulfa Antibiotics Itching and Rash   Sulfasalazine Itching and Rash    Patient Measurements: Height: 5\' 2"  (157.5 cm) Weight: 50.3 kg (111 lb) IBW/kg (Calculated) : 50.1 Heparin Dosing Weight: 50.3 kg  Vital Signs: Temp: 99.2 F (37.3 C) (10/29 0146) Temp Source: Oral (10/29 0146) BP: 145/64 (10/29 0600) Pulse Rate: 71 (10/29 0600)  Labs: Recent Labs    12/04/22 1826 12/04/22 1851 12/04/22 2056 12/05/22 0020 12/05/22 0420 12/05/22 0855 12/05/22 1026  HGB  --  9.5*  --   --   --   --   --   HCT  --  29.9*  --   --   --   --   --   PLT  --  211  --   --   --   --   --   APTT  --   --   --  29  --   --   --   LABPROT  --   --   --  14.6  --   --   --   INR  --   --   --  1.1  --   --   --   HEPARINUNFRC  --   --   --   --   --   --  0.23*  CREATININE 2.30* 1.45*  --   --  1.73*  --   --   TROPONINIHS  --  79* 106*  --   --  91*  --     Estimated Creatinine Clearance: 20.5 mL/min (A) (by C-G formula based on SCr of 1.73 mg/dL (H)).   Medical History: Past Medical History:  Diagnosis Date   Allergic rhinitis    Arthritis    joints   Cancer (HCC)    Cervical disc disease    Chronic kidney disease    STAGE 3   GERD (gastroesophageal reflux disease)    H/O blood clots    around her 20's   History of asthma    History of colon polyps    History of gout    Hyperlipidemia    Hypertension    PONV (postoperative nausea and vomiting)    nausea   Shingles     Assessment: Jane Griffin is a 82 y.o. female presenting with chest tightness, concerning for ACS. PMH significant for presyncope and left breast cancer s/p excision 2023 XRT. Pharmacy has been  consulted to initiate and monitor heparin. Patient is not on chronic anticoagulation PTA per chart review.   Goal of Therapy:  Heparin level 0.3-0.7 units/ml Monitor platelets by anticoagulation protocol: Yes  1029 1026 HL 0.23 Subtherapeutic  Plan:  Bolus 750 units x 1 Increase heparin infusion to 750 units/hr Check HL in 8 hours CBC daily while on heparin  Will M. Dareen Piano, PharmD Clinical Pharmacist 12/05/2022 11:05 AM

## 2022-12-06 LAB — LIPOPROTEIN A (LPA): Lipoprotein (a): 120.4 nmol/L — ABNORMAL HIGH (ref <75.0)

## 2022-12-07 NOTE — Discharge Summary (Signed)
Physician Discharge Summary   Patient: Jane Griffin MRN: [id number] DOB: 11-28-1940  Admit date:     12/04/2022  Discharge date: 12/05/2022  Discharge Physician: Delfino Lovett   PCP: Lauro Regulus, MD   Recommendations at discharge:    F/up with outpt providers as requested  Discharge Diagnoses: Principal Problem:   NSTEMI (non-ST elevated myocardial infarction) [facility]) Active Problems:   Postural dizziness with presyncope   Hypomagnesemia   Hypokalemia   Hypocalcemia   Invasive ductal carcinoma of left breast (HCC)   Anemia   Metabolic acidosis   Chronic kidney disease   Cough   Grief at loss of child   Chest tightness   Elevated troponin  Hospital Course: Assessment and Plan:  82 y.o. female with medical history significant for Left breast cancer s/p excision 2023 XRT on maintenance tamoxifen, with past history of presyncope back in 2019 with an extensive cardiac workup at that time, reevaluated recently on 11/27/2022 for palpitations and presyncope who was admitted for chest tightness and shortness of breath.  Elevated troponin due to demand ischemia No MI  CAD HTN Cardio seen and recommends outpt stress test and has been already scheduled by them for coming Monday Stress test isn't available in the Hospital for next 2 days per cardio and as patient is not having chest pain or any other symptoms she was recommended for D/C. Patient and her son are in agreement with the plans. normal LVEF/wall motion on echo   Electrolytes abnormality Repleted  Chronic Anemia Outpt eval. Close to baseline. No active GI bleeding      Consultants: Cardio  Disposition: Home Diet recommendation:  Discharge Diet Orders (From admission, onward)     Start     Ordered   12/05/22 0000  Diet - low sodium heart healthy        12/05/22 1401           Carb modified diet DISCHARGE MEDICATION: Allergies as of 12/05/2022       Reactions   Codeine Nausea Only    Gabapentin    nightmares   Lipitor [atorvastatin]    Aches   Pravachol [pravastatin Sodium]    aches   Tramadol    nausea   Aleve [naproxen Sodium] Rash   Sulfa Antibiotics Itching, Rash   Sulfasalazine Itching, Rash        Medication List     STOP taking these medications    azithromycin 250 MG tablet Commonly known as: ZITHROMAX   clindamycin 300 MG capsule Commonly known as: CLEOCIN   fluticasone-salmeterol 100-50 MCG/ACT Aepb Commonly known as: ADVAIR   HYDROcodone-acetaminophen 5-325 MG tablet Commonly known as: NORCO/VICODIN   VITAMIN B-12 IJ       TAKE these medications    acetaminophen 500 MG tablet Commonly known as: TYLENOL Take 500 mg by mouth every 6 (six) hours as needed for mild pain.   alendronate 70 MG tablet Commonly known as: Fosamax Take 1 tablet (70 mg total) by mouth once a week. Take with a full glass of water on an empty stomach.   colchicine 0.6 MG tablet PLEASE SEE ATTACHED FOR DETAILED DIRECTIONS   Febuxostat 80 MG Tabs Take 80 mg by mouth daily.   fluvastatin XL 80 MG 24 hr tablet Commonly known as: LESCOL XL Take 80 mg by mouth daily.   pantoprazole 40 MG tablet Commonly known as: PROTONIX TAKE 1 TABLET BY MOUTH ONCE A DAY 30-60 MINUTES BEFORE FIRST MEAL OF THE DAY  tamoxifen 20 MG tablet Commonly known as: NOLVADEX TAKE 1 TABLET BY MOUTH EVERY DAY        Follow-up Information     Lauro Regulus, MD. Schedule an appointment as soon as possible for a visit in 1 week(s).   Specialty: Internal Medicine Why: [facility] Discharge F/UP Contact information: [address] [facility] [place] [address] [phone]         Kathryne Gin, MD. Go on 12/13/2022.   Specialty: Cardiology Why: @ 9 am as scheduled Contact information: [address] [place] [address] [phone]                Discharge Exam: Ceasar Mons Weights   12/04/22 1848  Weight: 50.3 kg      General: She is not in acute distress. HENT:     Head: Normocephalic and atraumatic.  Cardiovascular:     Rate and Rhythm: Normal rate and regular rhythm.     Heart sounds: Normal heart sounds.  Pulmonary:     Effort: Pulmonary effort is normal.     Breath sounds: Normal breath sounds.  Abdominal:     Palpations: Abdomen is soft.     Tenderness: There is no abdominal tenderness.  Neurological:     Mental Status: Mental status is at baseline.   Condition at discharge: good  The results of significant diagnostics from this hospitalization (including imaging, microbiology, ancillary and laboratory) are listed below for reference.   Imaging Studies: ECHOCARDIOGRAM COMPLETE  Result Date: 12/05/2022    ECHOCARDIOGRAM REPORT   Patient Name:   Jane Griffin Date of Exam: 12/05/2022 Medical Rec #:  [id number]     Height:       62.0 in Accession #:    [id number]    Weight:       111.0 lb Date of Birth:  [birth year]     BSA:          1.488 m Patient Age:    82 years      BP:           145/64 mmHg Patient Gender: F             HR:           74 bpm. Exam Location:  [facility] Procedure: 2D Echo, Cardiac Doppler and Color Doppler Indications:     NSTEMI  History:         Patient has no prior history of Echocardiogram examinations.                  Signs/Symptoms:Dizziness/Lightheadedness. CKD, Breast CA.  Sonographer:     Mikki Harbor Referring Phys:  [id number] Andris Baumann Diagnosing Phys: Mellody Drown Alluri IMPRESSIONS  1. Left ventricular ejection fraction, by estimation, is 50 to 55%. The left ventricle has normal function. The left ventricle has no regional wall motion abnormalities. Left ventricular diastolic parameters are consistent with Grade II diastolic dysfunction (pseudonormalization).  2. Right ventricular systolic function is normal. The right ventricular size is normal. There is moderately elevated pulmonary artery systolic pressure. The estimated right ventricular systolic pressure is 50.3  mmHg.  3. Left atrial size was mildly dilated.  4. Moderate mitral valve regurgitation.  5. Tricuspid valve regurgitation is moderate.  6. The aortic valve is tricuspid. Aortic valve regurgitation is trivial.  7. The inferior vena cava is normal in size with greater than 50% respiratory variability, suggesting right atrial pressure of 3 mmHg. FINDINGS  Left  Physician Discharge Summary   Patient: Jane Griffin MRN: [id number] DOB: 11-28-1940  Admit date:     12/04/2022  Discharge date: 12/05/2022  Discharge Physician: Delfino Lovett   PCP: Lauro Regulus, MD   Recommendations at discharge:    F/up with outpt providers as requested  Discharge Diagnoses: Principal Problem:   NSTEMI (non-ST elevated myocardial infarction) [facility]) Active Problems:   Postural dizziness with presyncope   Hypomagnesemia   Hypokalemia   Hypocalcemia   Invasive ductal carcinoma of left breast (HCC)   Anemia   Metabolic acidosis   Chronic kidney disease   Cough   Grief at loss of child   Chest tightness   Elevated troponin  Hospital Course: Assessment and Plan:  82 y.o. female with medical history significant for Left breast cancer s/p excision 2023 XRT on maintenance tamoxifen, with past history of presyncope back in 2019 with an extensive cardiac workup at that time, reevaluated recently on 11/27/2022 for palpitations and presyncope who was admitted for chest tightness and shortness of breath.  Elevated troponin due to demand ischemia No MI  CAD HTN Cardio seen and recommends outpt stress test and has been already scheduled by them for coming Monday Stress test isn't available in the Hospital for next 2 days per cardio and as patient is not having chest pain or any other symptoms she was recommended for D/C. Patient and her son are in agreement with the plans. normal LVEF/wall motion on echo   Electrolytes abnormality Repleted  Chronic Anemia Outpt eval. Close to baseline. No active GI bleeding      Consultants: Cardio  Disposition: Home Diet recommendation:  Discharge Diet Orders (From admission, onward)     Start     Ordered   12/05/22 0000  Diet - low sodium heart healthy        12/05/22 1401           Carb modified diet DISCHARGE MEDICATION: Allergies as of 12/05/2022       Reactions   Codeine Nausea Only    Gabapentin    nightmares   Lipitor [atorvastatin]    Aches   Pravachol [pravastatin Sodium]    aches   Tramadol    nausea   Aleve [naproxen Sodium] Rash   Sulfa Antibiotics Itching, Rash   Sulfasalazine Itching, Rash        Medication List     STOP taking these medications    azithromycin 250 MG tablet Commonly known as: ZITHROMAX   clindamycin 300 MG capsule Commonly known as: CLEOCIN   fluticasone-salmeterol 100-50 MCG/ACT Aepb Commonly known as: ADVAIR   HYDROcodone-acetaminophen 5-325 MG tablet Commonly known as: NORCO/VICODIN   VITAMIN B-12 IJ       TAKE these medications    acetaminophen 500 MG tablet Commonly known as: TYLENOL Take 500 mg by mouth every 6 (six) hours as needed for mild pain.   alendronate 70 MG tablet Commonly known as: Fosamax Take 1 tablet (70 mg total) by mouth once a week. Take with a full glass of water on an empty stomach.   colchicine 0.6 MG tablet PLEASE SEE ATTACHED FOR DETAILED DIRECTIONS   Febuxostat 80 MG Tabs Take 80 mg by mouth daily.   fluvastatin XL 80 MG 24 hr tablet Commonly known as: LESCOL XL Take 80 mg by mouth daily.   pantoprazole 40 MG tablet Commonly known as: PROTONIX TAKE 1 TABLET BY MOUTH ONCE A DAY 30-60 MINUTES BEFORE FIRST MEAL OF THE DAY  Physician Discharge Summary   Patient: Jane Griffin MRN: [id number] DOB: 11-28-1940  Admit date:     12/04/2022  Discharge date: 12/05/2022  Discharge Physician: Delfino Lovett   PCP: Lauro Regulus, MD   Recommendations at discharge:    F/up with outpt providers as requested  Discharge Diagnoses: Principal Problem:   NSTEMI (non-ST elevated myocardial infarction) [facility]) Active Problems:   Postural dizziness with presyncope   Hypomagnesemia   Hypokalemia   Hypocalcemia   Invasive ductal carcinoma of left breast (HCC)   Anemia   Metabolic acidosis   Chronic kidney disease   Cough   Grief at loss of child   Chest tightness   Elevated troponin  Hospital Course: Assessment and Plan:  82 y.o. female with medical history significant for Left breast cancer s/p excision 2023 XRT on maintenance tamoxifen, with past history of presyncope back in 2019 with an extensive cardiac workup at that time, reevaluated recently on 11/27/2022 for palpitations and presyncope who was admitted for chest tightness and shortness of breath.  Elevated troponin due to demand ischemia No MI  CAD HTN Cardio seen and recommends outpt stress test and has been already scheduled by them for coming Monday Stress test isn't available in the Hospital for next 2 days per cardio and as patient is not having chest pain or any other symptoms she was recommended for D/C. Patient and her son are in agreement with the plans. normal LVEF/wall motion on echo   Electrolytes abnormality Repleted  Chronic Anemia Outpt eval. Close to baseline. No active GI bleeding      Consultants: Cardio  Disposition: Home Diet recommendation:  Discharge Diet Orders (From admission, onward)     Start     Ordered   12/05/22 0000  Diet - low sodium heart healthy        12/05/22 1401           Carb modified diet DISCHARGE MEDICATION: Allergies as of 12/05/2022       Reactions   Codeine Nausea Only    Gabapentin    nightmares   Lipitor [atorvastatin]    Aches   Pravachol [pravastatin Sodium]    aches   Tramadol    nausea   Aleve [naproxen Sodium] Rash   Sulfa Antibiotics Itching, Rash   Sulfasalazine Itching, Rash        Medication List     STOP taking these medications    azithromycin 250 MG tablet Commonly known as: ZITHROMAX   clindamycin 300 MG capsule Commonly known as: CLEOCIN   fluticasone-salmeterol 100-50 MCG/ACT Aepb Commonly known as: ADVAIR   HYDROcodone-acetaminophen 5-325 MG tablet Commonly known as: NORCO/VICODIN   VITAMIN B-12 IJ       TAKE these medications    acetaminophen 500 MG tablet Commonly known as: TYLENOL Take 500 mg by mouth every 6 (six) hours as needed for mild pain.   alendronate 70 MG tablet Commonly known as: Fosamax Take 1 tablet (70 mg total) by mouth once a week. Take with a full glass of water on an empty stomach.   colchicine 0.6 MG tablet PLEASE SEE ATTACHED FOR DETAILED DIRECTIONS   Febuxostat 80 MG Tabs Take 80 mg by mouth daily.   fluvastatin XL 80 MG 24 hr tablet Commonly known as: LESCOL XL Take 80 mg by mouth daily.   pantoprazole 40 MG tablet Commonly known as: PROTONIX TAKE 1 TABLET BY MOUTH ONCE A DAY 30-60 MINUTES BEFORE FIRST MEAL OF THE DAY  Physician Discharge Summary   Patient: Jane Griffin MRN: [id number] DOB: 11-28-1940  Admit date:     12/04/2022  Discharge date: 12/05/2022  Discharge Physician: Delfino Lovett   PCP: Lauro Regulus, MD   Recommendations at discharge:    F/up with outpt providers as requested  Discharge Diagnoses: Principal Problem:   NSTEMI (non-ST elevated myocardial infarction) [facility]) Active Problems:   Postural dizziness with presyncope   Hypomagnesemia   Hypokalemia   Hypocalcemia   Invasive ductal carcinoma of left breast (HCC)   Anemia   Metabolic acidosis   Chronic kidney disease   Cough   Grief at loss of child   Chest tightness   Elevated troponin  Hospital Course: Assessment and Plan:  82 y.o. female with medical history significant for Left breast cancer s/p excision 2023 XRT on maintenance tamoxifen, with past history of presyncope back in 2019 with an extensive cardiac workup at that time, reevaluated recently on 11/27/2022 for palpitations and presyncope who was admitted for chest tightness and shortness of breath.  Elevated troponin due to demand ischemia No MI  CAD HTN Cardio seen and recommends outpt stress test and has been already scheduled by them for coming Monday Stress test isn't available in the Hospital for next 2 days per cardio and as patient is not having chest pain or any other symptoms she was recommended for D/C. Patient and her son are in agreement with the plans. normal LVEF/wall motion on echo   Electrolytes abnormality Repleted  Chronic Anemia Outpt eval. Close to baseline. No active GI bleeding      Consultants: Cardio  Disposition: Home Diet recommendation:  Discharge Diet Orders (From admission, onward)     Start     Ordered   12/05/22 0000  Diet - low sodium heart healthy        12/05/22 1401           Carb modified diet DISCHARGE MEDICATION: Allergies as of 12/05/2022       Reactions   Codeine Nausea Only    Gabapentin    nightmares   Lipitor [atorvastatin]    Aches   Pravachol [pravastatin Sodium]    aches   Tramadol    nausea   Aleve [naproxen Sodium] Rash   Sulfa Antibiotics Itching, Rash   Sulfasalazine Itching, Rash        Medication List     STOP taking these medications    azithromycin 250 MG tablet Commonly known as: ZITHROMAX   clindamycin 300 MG capsule Commonly known as: CLEOCIN   fluticasone-salmeterol 100-50 MCG/ACT Aepb Commonly known as: ADVAIR   HYDROcodone-acetaminophen 5-325 MG tablet Commonly known as: NORCO/VICODIN   VITAMIN B-12 IJ       TAKE these medications    acetaminophen 500 MG tablet Commonly known as: TYLENOL Take 500 mg by mouth every 6 (six) hours as needed for mild pain.   alendronate 70 MG tablet Commonly known as: Fosamax Take 1 tablet (70 mg total) by mouth once a week. Take with a full glass of water on an empty stomach.   colchicine 0.6 MG tablet PLEASE SEE ATTACHED FOR DETAILED DIRECTIONS   Febuxostat 80 MG Tabs Take 80 mg by mouth daily.   fluvastatin XL 80 MG 24 hr tablet Commonly known as: LESCOL XL Take 80 mg by mouth daily.   pantoprazole 40 MG tablet Commonly known as: PROTONIX TAKE 1 TABLET BY MOUTH ONCE A DAY 30-60 MINUTES BEFORE FIRST MEAL OF THE DAY

## 2022-12-12 DIAGNOSIS — I272 Pulmonary hypertension, unspecified: Secondary | ICD-10-CM | POA: Diagnosis present

## 2022-12-12 DIAGNOSIS — I340 Nonrheumatic mitral (valve) insufficiency: Secondary | ICD-10-CM | POA: Insufficient documentation

## 2022-12-12 DIAGNOSIS — I471 Supraventricular tachycardia, unspecified: Secondary | ICD-10-CM | POA: Insufficient documentation

## 2022-12-12 DIAGNOSIS — I2510 Atherosclerotic heart disease of native coronary artery without angina pectoris: Secondary | ICD-10-CM | POA: Diagnosis present

## 2022-12-12 DIAGNOSIS — I071 Rheumatic tricuspid insufficiency: Secondary | ICD-10-CM | POA: Insufficient documentation

## 2023-02-06 ENCOUNTER — Ambulatory Visit
Admission: RE | Admit: 2023-02-06 | Discharge: 2023-02-06 | Disposition: A | Discharge status: Home / Self Care | Payer: Medicare PPO | Source: Ambulatory Visit | Attending: Oncology | Admitting: Oncology

## 2023-02-06 DIAGNOSIS — Z79810 Long term (current) use of selective estrogen receptor modulators (SERMs): Secondary | ICD-10-CM | POA: Insufficient documentation

## 2023-02-06 DIAGNOSIS — C50919 Malignant neoplasm of unspecified site of unspecified female breast: Secondary | ICD-10-CM | POA: Insufficient documentation

## 2023-02-06 DIAGNOSIS — M810 Age-related osteoporosis without current pathological fracture: Secondary | ICD-10-CM | POA: Diagnosis not present

## 2023-02-06 DIAGNOSIS — Z923 Personal history of irradiation: Secondary | ICD-10-CM | POA: Insufficient documentation

## 2023-02-06 DIAGNOSIS — C50412 Malignant neoplasm of upper-outer quadrant of left female breast: Secondary | ICD-10-CM | POA: Insufficient documentation

## 2023-02-06 HISTORY — DX: Personal history of irradiation: Z92.3

## 2023-02-08 ENCOUNTER — Other Ambulatory Visit: Payer: Self-pay | Admitting: Oncology

## 2023-03-12 ENCOUNTER — Other Ambulatory Visit: Payer: Self-pay | Admitting: Oncology

## 2023-04-02 ENCOUNTER — Ambulatory Visit: Payer: Medicare PPO | Admitting: Radiation Oncology

## 2023-04-09 ENCOUNTER — Ambulatory Visit
Admission: RE | Admit: 2023-04-09 | Discharge: 2023-04-09 | Disposition: A | Discharge status: Home / Self Care | Payer: Medicare PPO | Source: Ambulatory Visit | Attending: Radiation Oncology | Admitting: Radiation Oncology

## 2023-04-09 VITALS — BP 143/82 | HR 69 | Ht 62.0 in | Wt 112.3 lb

## 2023-04-09 DIAGNOSIS — Z170 Estrogen receptor positive status [ER+]: Secondary | ICD-10-CM | POA: Insufficient documentation

## 2023-04-09 DIAGNOSIS — Z79810 Long term (current) use of selective estrogen receptor modulators (SERMs): Secondary | ICD-10-CM | POA: Insufficient documentation

## 2023-04-09 DIAGNOSIS — Z923 Personal history of irradiation: Secondary | ICD-10-CM | POA: Insufficient documentation

## 2023-04-09 DIAGNOSIS — C50412 Malignant neoplasm of upper-outer quadrant of left female breast: Secondary | ICD-10-CM | POA: Diagnosis present

## 2023-04-09 NOTE — Progress Notes (Signed)
 Radiation Oncology Follow up Note  Name: Jane Griffin   Date:   04/09/2023 MRN:  [id number] DOB: April 04, 1940    This 83 y.o. 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  Seen today in routine follow-up she is doing well.  She specifically denies breast tenderness cough or bone pain..  She had a mammogram back in December showing stable postlumpectomy changes in the left breast.  The ribbon clip remained in the left upper outer quadrant unclear whether was displaced from malignancy site at time of biopsy or whether there is residual malignancy in the left breast.  Recommended consideration would be for further biopsy versus repeat mammogram in 12 months.  Patient is currently on tamoxifen  COMPLICATIONS OF TREATMENT: none  FOLLOW UP COMPLIANCE: keeps appointments   PHYSICAL EXAM:  BP (!) 143/82 Comment: Patient took BP med 30 minutes before comig in to clinic.  Pulse 69   Ht 5\' 2"  (1.575 m)   Wt 112 lb 4.8 oz (50.9 kg)   BMI 20.54 kg/m  Lungs are clear to A&P cardiac examination essentially unremarkable with regular rate and rhythm. No dominant mass or nodularity is noted in either breast in 2 positions examined. Incision is well-healed. No axillary or supraclavicular adenopathy is appreciated. Cosmetic result is excellent.  Well-developed well-nourished patient in NAD. HEENT reveals PERLA, EOMI, discs not visualized.  Oral cavity is clear. No oral mucosal lesions are identified. Neck is clear without evidence of cervical or supraclavicular adenopathy. Lungs are clear to A&P. Cardiac examination is essentially unremarkable with regular rate and rhythm without  murmur rub or thrill. Abdomen is benign with no organomegaly or masses noted. Motor sensory and DTR levels are equal and symmetric in the upper and lower extremities. Cranial nerves II through XII are grossly intact. Proprioception is intact. No peripheral adenopathy or edema is identified. No motor or sensory levels are noted. Crude visual fields are within normal range.  RADIOLOGY RESULTS: Mammograms reviewed compatible with above-stated findings  PLAN: At the present time of asked to see her back in 1 year for follow-up.  From her medical oncology will follow-up on her mammograms which I believe will be scheduled for next December.  Patient knows to call in anytime with any concerns.  Patient continues tamoxifen.  She knows to call with any concerns at any time.  I would like to take this opportunity to thank you for allowing me to participate in the care of your patient.Carmina Miller, MD

## 2023-04-17 ENCOUNTER — Other Ambulatory Visit: Payer: Self-pay

## 2023-04-17 ENCOUNTER — Observation Stay
Admission: EM | Admit: 2023-04-17 | Discharge: 2023-04-20 | Disposition: A | Discharge status: Home / Self Care | Attending: Obstetrics and Gynecology | Admitting: Obstetrics and Gynecology

## 2023-04-17 ENCOUNTER — Emergency Department

## 2023-04-17 DIAGNOSIS — N179 Acute kidney failure, unspecified: Secondary | ICD-10-CM | POA: Diagnosis not present

## 2023-04-17 DIAGNOSIS — M109 Gout, unspecified: Secondary | ICD-10-CM | POA: Insufficient documentation

## 2023-04-17 DIAGNOSIS — I82409 Acute embolism and thrombosis of unspecified deep veins of unspecified lower extremity: Secondary | ICD-10-CM | POA: Diagnosis present

## 2023-04-17 DIAGNOSIS — I82412 Acute embolism and thrombosis of left femoral vein: Secondary | ICD-10-CM | POA: Diagnosis not present

## 2023-04-17 DIAGNOSIS — Z1152 Encounter for screening for COVID-19: Secondary | ICD-10-CM | POA: Diagnosis not present

## 2023-04-17 DIAGNOSIS — E785 Hyperlipidemia, unspecified: Secondary | ICD-10-CM | POA: Insufficient documentation

## 2023-04-17 DIAGNOSIS — Z853 Personal history of malignant neoplasm of breast: Secondary | ICD-10-CM | POA: Insufficient documentation

## 2023-04-17 DIAGNOSIS — N1832 Chronic kidney disease, stage 3b: Secondary | ICD-10-CM | POA: Diagnosis not present

## 2023-04-17 DIAGNOSIS — K317 Polyp of stomach and duodenum: Secondary | ICD-10-CM | POA: Insufficient documentation

## 2023-04-17 DIAGNOSIS — K921 Melena: Secondary | ICD-10-CM | POA: Diagnosis not present

## 2023-04-17 DIAGNOSIS — Z79899 Other long term (current) drug therapy: Secondary | ICD-10-CM | POA: Diagnosis not present

## 2023-04-17 DIAGNOSIS — I272 Pulmonary hypertension, unspecified: Secondary | ICD-10-CM | POA: Insufficient documentation

## 2023-04-17 DIAGNOSIS — I129 Hypertensive chronic kidney disease with stage 1 through stage 4 chronic kidney disease, or unspecified chronic kidney disease: Secondary | ICD-10-CM | POA: Insufficient documentation

## 2023-04-17 DIAGNOSIS — Z7982 Long term (current) use of aspirin: Secondary | ICD-10-CM | POA: Insufficient documentation

## 2023-04-17 DIAGNOSIS — E875 Hyperkalemia: Secondary | ICD-10-CM | POA: Insufficient documentation

## 2023-04-17 DIAGNOSIS — I2510 Atherosclerotic heart disease of native coronary artery without angina pectoris: Secondary | ICD-10-CM | POA: Diagnosis not present

## 2023-04-17 DIAGNOSIS — R0602 Shortness of breath: Secondary | ICD-10-CM | POA: Insufficient documentation

## 2023-04-17 DIAGNOSIS — C50912 Malignant neoplasm of unspecified site of left female breast: Secondary | ICD-10-CM | POA: Diagnosis present

## 2023-04-17 DIAGNOSIS — I82492 Acute embolism and thrombosis of other specified deep vein of left lower extremity: Secondary | ICD-10-CM | POA: Diagnosis not present

## 2023-04-17 DIAGNOSIS — R109 Unspecified abdominal pain: Secondary | ICD-10-CM | POA: Diagnosis present

## 2023-04-17 DIAGNOSIS — N189 Chronic kidney disease, unspecified: Secondary | ICD-10-CM

## 2023-04-17 DIAGNOSIS — N184 Chronic kidney disease, stage 4 (severe): Secondary | ICD-10-CM

## 2023-04-17 DIAGNOSIS — K449 Diaphragmatic hernia without obstruction or gangrene: Secondary | ICD-10-CM | POA: Insufficient documentation

## 2023-04-17 DIAGNOSIS — J45909 Unspecified asthma, uncomplicated: Secondary | ICD-10-CM | POA: Diagnosis not present

## 2023-04-17 DIAGNOSIS — M1A00X0 Idiopathic chronic gout, unspecified site, without tophus (tophi): Secondary | ICD-10-CM | POA: Diagnosis present

## 2023-04-17 DIAGNOSIS — I824Y2 Acute embolism and thrombosis of unspecified deep veins of left proximal lower extremity: Secondary | ICD-10-CM

## 2023-04-17 LAB — CBC
HCT: 37.5 % (ref 36.0–46.0)
Hemoglobin: 11.5 g/dL — ABNORMAL LOW (ref 12.0–15.0)
MCH: 28.6 pg (ref 26.0–34.0)
MCHC: 30.7 g/dL (ref 30.0–36.0)
MCV: 93.3 fL (ref 80.0–100.0)
Platelets: 136 10*3/uL — ABNORMAL LOW (ref 150–400)
RBC: 4.02 MIL/uL (ref 3.87–5.11)
RDW: 14.6 % (ref 11.5–15.5)
WBC: 16.6 10*3/uL — ABNORMAL HIGH (ref 4.0–10.5)
nRBC: 0.1 % (ref 0.0–0.2)

## 2023-04-17 LAB — COMPREHENSIVE METABOLIC PANEL
ALT: 17 U/L (ref 0–44)
AST: 25 U/L (ref 15–41)
Albumin: 3.5 g/dL (ref 3.5–5.0)
Alkaline Phosphatase: 38 U/L (ref 38–126)
Anion gap: 13 (ref 5–15)
BUN: 58 mg/dL — ABNORMAL HIGH (ref 8–23)
CO2: 15 mmol/L — ABNORMAL LOW (ref 22–32)
Calcium: 9.3 mg/dL (ref 8.9–10.3)
Chloride: 109 mmol/L (ref 98–111)
Creatinine, Ser: 2.52 mg/dL — ABNORMAL HIGH (ref 0.44–1.00)
GFR, Estimated: 19 mL/min — ABNORMAL LOW (ref >60)
Glucose, Bld: 129 mg/dL — ABNORMAL HIGH (ref 70–99)
Potassium: 5.1 mmol/L (ref 3.5–5.1)
Sodium: 137 mmol/L (ref 135–145)
Total Bilirubin: 0.8 mg/dL (ref 0.0–1.2)
Total Protein: 6.7 g/dL (ref 6.5–8.1)

## 2023-04-17 LAB — TYPE AND SCREEN
ABO/RH(D): O POS
Antibody Screen: NEGATIVE

## 2023-04-17 LAB — PROTIME-INR
INR: 1.3 — ABNORMAL HIGH (ref 0.8–1.2)
Prothrombin Time: 16.1 s — ABNORMAL HIGH (ref 11.4–15.2)

## 2023-04-17 LAB — BRAIN NATRIURETIC PEPTIDE: B Natriuretic Peptide: 97.7 pg/mL (ref 0.0–100.0)

## 2023-04-17 LAB — RESP PANEL BY RT-PCR (RSV, FLU A&B, COVID)  RVPGX2
Influenza A by PCR: NEGATIVE
Influenza B by PCR: NEGATIVE
Resp Syncytial Virus by PCR: NEGATIVE
SARS Coronavirus 2 by RT PCR: NEGATIVE

## 2023-04-17 LAB — TROPONIN I (HIGH SENSITIVITY)
Troponin I (High Sensitivity): 39 ng/L — ABNORMAL HIGH (ref <18)
Troponin I (High Sensitivity): 41 ng/L — ABNORMAL HIGH (ref <18)

## 2023-04-17 MED ORDER — LACTATED RINGERS IV SOLN: INTRAVENOUS | Status: AC

## 2023-04-17 MED ORDER — FENTANYL CITRATE PF 50 MCG/ML IJ SOSY
50.0000 ug | PREFILLED_SYRINGE | Freq: Once | INTRAMUSCULAR | Status: AC
  Administered 2023-04-17: 50 ug via INTRAVENOUS
  Filled 2023-04-17: qty 1

## 2023-04-17 MED ORDER — HEPARIN BOLUS VIA INFUSION
3000.0000 [IU] | Freq: Once | INTRAVENOUS | Status: AC
  Administered 2023-04-17: 3000 [IU] via INTRAVENOUS
  Filled 2023-04-17: qty 3000

## 2023-04-17 MED ORDER — FEBUXOSTAT 40 MG PO TABS
80.0000 mg | ORAL_TABLET | Freq: Every day | ORAL | Status: DC
  Administered 2023-04-18 – 2023-04-20 (×3): 80 mg via ORAL
  Filled 2023-04-17 (×3): qty 2

## 2023-04-17 MED ORDER — ONDANSETRON HCL 4 MG/2ML IJ SOLN: 4.0000 mg | Freq: Four times a day (QID) | INTRAMUSCULAR | Status: DC | PRN

## 2023-04-17 MED ORDER — ALBUTEROL SULFATE (2.5 MG/3ML) 0.083% IN NEBU: 2.5000 mg | INHALATION_SOLUTION | Freq: Four times a day (QID) | RESPIRATORY_TRACT | Status: DC | PRN

## 2023-04-17 MED ORDER — ONDANSETRON HCL 4 MG PO TABS: 4.0000 mg | ORAL_TABLET | Freq: Four times a day (QID) | ORAL | Status: DC | PRN

## 2023-04-17 MED ORDER — PRAVASTATIN SODIUM 20 MG PO TABS
40.0000 mg | ORAL_TABLET | Freq: Every day | ORAL | Status: DC
  Administered 2023-04-18 – 2023-04-19 (×2): 40 mg via ORAL
  Filled 2023-04-17 (×2): qty 2

## 2023-04-17 MED ORDER — HYDROMORPHONE HCL 1 MG/ML IJ SOLN: 0.5000 mg | INTRAMUSCULAR | Status: DC | PRN

## 2023-04-17 MED ORDER — PANTOPRAZOLE SODIUM 40 MG IV SOLR
40.0000 mg | Freq: Once | INTRAVENOUS | Status: AC
  Administered 2023-04-17: 40 mg via INTRAVENOUS
  Filled 2023-04-17: qty 10

## 2023-04-17 MED ORDER — HEPARIN (PORCINE) 25000 UT/250ML-% IV SOLN
800.0000 [IU]/h | INTRAVENOUS | Status: DC
  Administered 2023-04-17: 800 [IU]/h via INTRAVENOUS
  Filled 2023-04-17: qty 250

## 2023-04-17 MED ORDER — METOPROLOL TARTRATE 25 MG PO TABS
25.0000 mg | ORAL_TABLET | Freq: Two times a day (BID) | ORAL | Status: DC
  Administered 2023-04-17 – 2023-04-18 (×3): 25 mg via ORAL
  Filled 2023-04-17 (×5): qty 1

## 2023-04-17 MED ORDER — SODIUM CHLORIDE 0.9% FLUSH
3.0000 mL | Freq: Two times a day (BID) | INTRAVENOUS | Status: DC
  Administered 2023-04-18 – 2023-04-19 (×4): 3 mL via INTRAVENOUS

## 2023-04-17 MED ORDER — ONDANSETRON HCL 4 MG/2ML IJ SOLN
4.0000 mg | Freq: Once | INTRAMUSCULAR | Status: AC
  Administered 2023-04-17: 4 mg via INTRAVENOUS
  Filled 2023-04-17: qty 2

## 2023-04-17 NOTE — ED Notes (Signed)
 Holding heparin until CT is read per Fuller Plan, MD

## 2023-04-17 NOTE — Assessment & Plan Note (Addendum)
 Likely due to poor p.o. intake in the setting of.  Baseline creatinine ranging between 1.6 and 2, currently 2.5.  - IV fluids as ordered - Repeat BMP in the a.m. - Hold home nephrotoxic agents

## 2023-04-17 NOTE — Assessment & Plan Note (Addendum)
 Per chart review, pulmonary hypertension was noted on echocardiogram obtained in October 2024 given elevated RV systolic pressure at 50.  Patient has not been offered right heart cath (presumably due to renal function?).

## 2023-04-17 NOTE — ED Notes (Signed)
 First Nurse Note: Pt to ED via [facility]. No handoff from [facility]. Per registration pt here for 3 days of shortness of breath and chest pain.

## 2023-04-17 NOTE — H&P (Addendum)
 History and Physical    Patient: Jane Griffin:[id number] DOB: August 03, 1940 DOA: 04/17/2023 DOS: the patient was seen and examined on 04/17/2023 PCP: Jane Regulus, MD  Patient coming from: Home  Chief Complaint:  Chief Complaint  Patient presents with   Abdominal Pain   Shortness of Breath   HPI: Jane Griffin is a 83 y.o. female with medical history significant of nonobstructive CAD, pulmonary hypertension, hypertension, hyperlipidemia, mild intermittent asthma, prediabetes, CKD stage IIIb, paroxysmal SVT, who presents to the ED due to abdominal pain, dark stool, shortness of breath.  Jane Griffin states that she has been experiencing dyspnea on exertion for quite some time now but she did feel that it acutely worsened earlier today.  She notes that this worsening occurred when she was having severe abdominal pain and now her breathing seems back to normal.  She is quite worried on what has been causing her DOE, especially since she is not quite gotten a definitive answer.  Then 4 days ago, she developed epigastric abdominal pain that would occur specifically right after eating or drinking.  This has led to poor p.o. intake.  She has also developed melanotic stool with no hematochezia.  Last NSAID use was over 1 week ago and she uses very sparingly, as she usually takes Tylenol for pain.  Jane Griffin notes that earlier today, she noticed that her left leg was more swollen than her right.  Usually her legs are equally swollen at the ankles.  She endorses a prior history of DVT when she was very young after starting birth control.  No VTE since then.  ED course: On arrival to the ED, patient was normotensive at 109/76 with heart rate of 84.  She was saturating at 98% on room air.  She was afebrile at 98.  Initial workup notable for WBC of 16.6, hemoglobin of 11.5, platelets 136, bicarb 15, BUN 58, creatinine 2.52 with GFR 19.  Troponin elevated at 39 with flat trend to 41.  BNP within  normal limits.  Lower extremity Doppler positive for left femoral-popliteal DVT.  CT of the abdomen pending.  Chest x-ray with no active disease.  Patient started on IV heparin and TRH consulted for admission.  Review of Systems: As mentioned in the history of present illness. All other systems reviewed and are negative.  Past Medical History:  Diagnosis Date   Allergic rhinitis    Arthritis    joints   Cancer (HCC)    Cervical disc disease    Chronic kidney disease    STAGE 3   GERD (gastroesophageal reflux disease)    H/O blood clots    around her 20's   History of asthma    History of colon polyps    History of gout    Hyperlipidemia    Hypertension    Personal history of radiation therapy    PONV (postoperative nausea and vomiting)    nausea   Shingles    Past Surgical History:  Procedure Laterality Date   ABDOMINAL HYSTERECTOMY     BREAST BIOPSY Left 10/26/2021   [place] Core Bx ribbon clip positive   BREAST LUMPECTOMY Left 11/11/2021   Procedure: BREAST LUMPECTOMY;  Surgeon: Jane Mayotte, MD;  Location: [facility];  Service: General;  Laterality: Left;   BREAST LUMPECTOMY Left 11/11/2021   invasive lobular ca/neg margins/closest margin .3 mm   COLONOSCOPY  2013   COLONOSCOPY WITH PROPOFOL N/A 10/11/2016   Procedure: COLONOSCOPY WITH PROPOFOL;  Surgeon:  Jane Brightly, MD;  Location: [facility];  Service: Endoscopy;  Laterality: N/A;   DISTAL INTERPHALANGEAL JOINT FUSION Right 08/08/2022   Procedure: Right middle finger DISTAL INTERPHALANGEAL JOINT FUSION;  Surgeon: Jane Bucker, MD;  Location: [facility];  Service: Orthopedics;  Laterality: Right;   ELBOW SURGERY Left    EYE SURGERY     both eyes cataract removed   FEMORAL HERNIA REPAIR Right 09/18/2015   Procedure: HERNIA REPAIR femoral  ADULT;  Surgeon: Jane Brightly, MD;  Location: [facility];  Service: General;  Laterality: Right;   HERNIA REPAIR     LAPAROSCOPIC BILATERAL SALPINGO  OOPHERECTOMY Bilateral 09/06/2015   Procedure: LAPAROSCOPIC BILATERAL SALPINGO OOPHORECTOMY;  Surgeon: Jane Bouchard, MD;  Location: [facility];  Service: Gynecology;  Laterality: Bilateral;   PARTIAL HYSTERECTOMY     TUBAL LIGATION     UPPER GI ENDOSCOPY     WRIST SURGERY Right    Social History:  reports that she has never smoked. She has never used smokeless tobacco. She reports that she does not drink alcohol and does not use drugs.  Allergies  Allergen Reactions   Codeine Nausea Only   Gabapentin     nightmares   Lipitor [Atorvastatin]     Aches    Pravachol [Pravastatin Sodium]     aches   Tramadol     nausea   Aleve [Naproxen Sodium] Rash   Sulfa Antibiotics Itching and Rash   Sulfasalazine Itching and Rash    Family History  Problem Relation Age of Onset   Clotting disorder Mother    Heart failure Mother    Emphysema Father    Heart failure Father    Cancer Daughter        breast cancer   Breast cancer Daughter 87    Prior to Admission medications   Medication Sig Start Date End Date Taking? Authorizing Provider  acetaminophen (TYLENOL) 500 MG tablet Take 500 mg by mouth every 6 (six) hours as needed for mild pain.    [provider]  alendronate (FOSAMAX) 70 MG tablet TAKE 1 TABLET BY MOUTH ONCE A WEEK. TAKE WITH A FULL GLASS OF WATER ON AN EMPTY STOMACH. 03/12/23   Jane Ruths, MD  aspirin EC 81 MG tablet Take 81 mg by mouth daily. Swallow whole.    [provider]  colchicine 0.6 MG tablet PLEASE SEE ATTACHED FOR DETAILED DIRECTIONS 08/23/22   [provider]  Febuxostat 80 MG TABS Take 80 mg by mouth daily. 09/01/22 08/27/23  [provider]  fluvastatin XL (LESCOL XL) 80 MG 24 hr tablet Take 80 mg by mouth daily. 06/10/15   [provider]  metoprolol tartrate (LOPRESSOR) 25 MG tablet Take 25 mg by mouth 2 (two) times daily.    [provider]  pantoprazole (PROTONIX) 40 MG tablet TAKE 1 TABLET BY  MOUTH ONCE A DAY 30-60 MINUTES BEFORE FIRST MEAL OF THE DAY 02/03/13   Jane Cowden, MD  tamoxifen (NOLVADEX) 20 MG tablet TAKE 1 TABLET BY MOUTH EVERY DAY 02/08/23   Jane Ruths, MD    Physical Exam: Vitals:   04/17/23 1428 04/17/23 1749  BP: 109/76 131/80  Pulse: 84 74  Resp: 20 18  Temp: 98 F (36.7 C)   TempSrc: Oral   SpO2: 98% 100%   Physical Exam Vitals and nursing note reviewed.  Constitutional:      General: She is not in acute distress.    Appearance: She is  normal weight.  HENT:     Head: Normocephalic and atraumatic.     Mouth/Throat:     Mouth: Mucous membranes are moist.     Pharynx: Oropharynx is clear.  Cardiovascular:     Rate and Rhythm: Normal rate and regular rhythm.     Heart sounds: No murmur heard. Pulmonary:     Effort: Pulmonary effort is normal. No respiratory distress.     Breath sounds: Normal breath sounds. No wheezing, rhonchi or rales.  Abdominal:     General: Bowel sounds are normal. There is no distension.     Palpations: Abdomen is soft.     Tenderness: There is no abdominal tenderness. There is no guarding.  Musculoskeletal:     Right lower leg: No edema.     Left lower leg: Edema (Trace pitting edema) present.  Skin:    General: Skin is warm and dry.     Findings: No erythema.  Neurological:     General: No focal deficit present.     Mental Status: She is alert and oriented to person, place, and time.  Psychiatric:        Mood and Affect: Mood normal.        Behavior: Behavior normal.    Data Reviewed: CBC with WBC of 16.6, hemoglobin of 11.5, MCV of 93, platelets 136 CMP with sodium of 137, potassium 5.1, bicarb 15, glucose 129, BUN 58, creatinine 2.52, AST 25, ALT 17, GFR 19 Troponin 39 and then 41 BNP 97 COVID-19, influenza and RSV PCR negative  EKG personally reviewed.  Sinus rhythm with rate of 85.  Right bundle branch block noted.  Nonspecific T wave inversions.  Compared to prior EKG, T wave inversions appear  chronic.  CT ABDOMEN PELVIS WO CONTRAST Result Date: 04/17/2023 CLINICAL DATA:  Abdomen pain EXAM: CT ABDOMEN AND PELVIS WITHOUT CONTRAST TECHNIQUE: Multidetector CT imaging of the abdomen and pelvis was performed following the standard protocol without IV contrast. RADIATION DOSE REDUCTION: This exam was performed according to the departmental dose-optimization program which includes automated exposure control, adjustment of the mA and/or kV according to patient size and/or use of iterative reconstruction technique. COMPARISON:  Chest x-ray 04/17/2023, CT abdomen pelvis 09/16/2015, chest CT 12/04/2022 FINDINGS: Lower chest: Lung bases demonstrate mild scarring at the lingula and left base. Negative for pleural effusion. Focal nodular airspace disease at the periphery of the right base measuring 8 mm on series 4, image 33. Coronary vascular calcification Hepatobiliary: No focal liver abnormality is seen. No gallstones, gallbladder wall thickening, or biliary dilatation. Pancreas: Unremarkable. No pancreatic ductal dilatation or surrounding inflammatory changes. Spleen: Normal in size without focal abnormality. Adrenals/Urinary Tract: Adrenal glands are normal. Kidneys show no hydronephrosis. The bladder is unremarkable. Stomach/Bowel: Stomach nonenlarged. No dilated small bowel. No acute bowel wall thickening. Mild diverticular disease of the left colon. Nonspecific hyperdense material within the distal colon. Vascular/Lymphatic: Aortic atherosclerosis. No enlarged abdominal or pelvic lymph nodes. Reproductive: Status post hysterectomy. No adnexal masses. Other: Negative for pelvic effusion or free air. Fat containing bilateral inguinal hernias, on the right some hazy internal density is noted. No bowel containing hernia Musculoskeletal: Moderate chronic compression deformity at T11. IMPRESSION: 1. No CT evidence for acute intra-abdominal or pelvic abnormality. 2. Mild diverticular disease of the left colon  without acute inflammatory process. 3. Fat containing bilateral inguinal hernias, on the right some hazy internal density is noted which may be due to edema or inflammation. No bowel containing hernia. 4. 8 mm nodular  airspace disease at the periphery of the right base. Non-contrast chest CT at 6-12 months is recommended. If the nodule is stable at time of repeat CT, then future CT at 18-24 months (from today's scan) is considered optional for low-risk patients, but is recommended for high-risk patients. This recommendation follows the consensus statement: Guidelines for Management of Incidental Pulmonary Nodules Detected on CT Images: From the Fleischner Society 2017; Radiology 2017; 284:228-243. 5. Aortic atherosclerosis. Electronically Signed   By: Jasmine Pang M.D.   On: 04/17/2023 18:19   US Venous Img Lower Unilateral Left Result Date: 04/17/2023 CLINICAL DATA:  leg swellin EXAM: LEFT LOWER EXTREMITY VENOUS DOPPLER ULTRASOUND TECHNIQUE: Gray-scale sonography with compression, as well as color and duplex ultrasound, were performed to evaluate the deep venous system(s) from the level of the common femoral vein through the popliteal and proximal calf veins. COMPARISON:  CT AP, 04/17/2023 FINDINGS: VENOUS Normal compressibility of the common femoral, and the visualized portions of profunda femoral and great saphenous vein. Heterogeneously-echogenic, near-occlusive filling defect within and incomplete compressibility of the imaged portions of the femoral and popliteal veins. The imaged calf veins appear patent. Limited views of the contralateral common femoral vein are unremarkable. OTHER No evidence of superficial thrombophlebitis or abnormal fluid collection. Limitations: none IMPRESSION: Acute, occlusive LEFT femoropopliteal DVT. Roanna Banning, MD [facility] Electronically Signed   By: Roanna Banning M.D.   On: 04/17/2023 17:25   DG Chest 2  View Result Date: 04/17/2023 CLINICAL DATA:  Shortness of breath.  Melena.  Abdominal pain EXAM: CHEST - 2 VIEW COMPARISON:  X-ray and CTA chest 12/04/2022. FINDINGS: No consolidation, pneumothorax or effusion. No edema. Normal cardiopericardial silhouette. Dense right upper lung nodule consistent with old granulomatous disease and calcified focus. Osteopenia and degenerative changes along the spine. IMPRESSION: Chronic changes.  No acute cardiopulmonary disease. Electronically Signed   By: Karen Kays M.D.   On: 04/17/2023 17:11   There are no new results to review at this time.  Assessment and Plan:  * Acute DVT (deep venous thrombosis) (HCC) Patient is presenting with left lower extremity enlargement compared to right of unknown duration, found to have acute occlusive DVT.  No prior history of blood clot with no recent triggering events. Potentially due to Tamoxifen?  - Heparin infusion given possible melena - If no additional episodes, can likely transition to Eliquis versus Xarelto tomorrow - Telemetry monitoring - Hold home tamoxifen  Melena Patient reports several day history of black stool with epigastric abdominal pain that always begins right after eating, concerning for gastritis. Last NSAID use over 1 week ago and she uses it very sparingly.  Question if this could be related to recent rounds of steroids. Hemoglobin is stable.  - GI consulted; appreciate their recommendations - Trend H&H - Protonix 40 mg IV twice daily - Clear liquid diet n.p.o. at midnight  Shortness of breath Per chart review, patient has ongoing shortness of breath that has been present since at least November 2024, for which she has seen both cardiology and pulmonology.  Cardiology was concerned for symptomatic pulmonary hypertension, and Pulmonology indicated possible fibrosis.  She has received multiple rounds of antibiotics, steroids, inhalers with minimal improvement.  At this time, shortness of breath is  slightly worse but no evidence of hypoxia.  - If patient develops hypoxia, will start supplemental oxygen as needed to maintain oxygen saturation above 88% - VQ scan ordered - Continue home regimen  Acute kidney injury superimposed  on chronic kidney disease (HCC) Likely due to poor p.o. intake in the setting of.  Baseline creatinine ranging between 1.6 and 2, currently 2.5.  - IV fluids as ordered - Repeat BMP in the a.m. - Hold home nephrotoxic agents  PHT (pulmonary hypertension) (HCC) Per chart review, pulmonary hypertension was noted on echocardiogram obtained in October 2024 given elevated RV systolic pressure at 50.  Patient has not been offered right heart cath (presumably due to renal function?).  Chronic gouty arthritis - Continue home regimen  Invasive ductal carcinoma of left breast (HCC) - Hold home tamoxifen in the setting of DVT  Advance Care Planning:   Code Status: Full Code verified by patient with son at bedside  Consults: None  Family Communication: Patient's son updated at bedside  Severity of Illness: The appropriate patient status for this patient is OBSERVATION. Observation status is judged to be reasonable and necessary in order to provide the required intensity of service to ensure the patient's safety. The patient's presenting symptoms, physical exam findings, and initial radiographic and laboratory data in the context of their medical condition is felt to place them at decreased risk for further clinical deterioration. Furthermore, it is anticipated that the patient will be medically stable for discharge from the hospital within 2 midnights of admission.   Author: Verdene Lennert, MD 04/17/2023 7:56 PM  For on call review [email or web address].

## 2023-04-17 NOTE — ED Triage Notes (Signed)
 Pt to ED via POV from [facility]. Pt was seen by Dr. Trisha Mangle for her inguinal hernia and brought to ED due to other symptoms. Pt reports abd pain with melena and SOB x4 days. Pt reports umbilical pain w/ radiation upwards to chest.

## 2023-04-17 NOTE — Assessment & Plan Note (Signed)
-   Hold home tamoxifen in the setting of DVT

## 2023-04-17 NOTE — Assessment & Plan Note (Addendum)
 Per chart review, patient has ongoing shortness of breath that has been present since at least November 2024, for which she has seen both cardiology and pulmonology.  Cardiology was concerned for symptomatic pulmonary hypertension, and Pulmonology indicated possible fibrosis.  She has received multiple rounds of antibiotics, steroids, inhalers with minimal improvement.  At this time, shortness of breath is slightly worse but no evidence of hypoxia.  - If patient develops hypoxia, will start supplemental oxygen as needed to maintain oxygen saturation above 88% - VQ scan ordered - Continue home regimen

## 2023-04-17 NOTE — ED Notes (Signed)
 Pt reports discoloration of LLE Dx this date DVT pt heparin initiated per physician order pt known GI bleed pt assisted to bs commode and back to stretcher family @ bs

## 2023-04-17 NOTE — Progress Notes (Signed)
 PHARMACY - ANTICOAGULATION CONSULT NOTE  Pharmacy Consult for heparin Indication: DVT  Allergies  Allergen Reactions   Codeine Nausea Only   Gabapentin     nightmares   Lipitor [Atorvastatin]     Aches    Pravachol [Pravastatin Sodium]     aches   Tramadol     nausea   Aleve [Naproxen Sodium] Rash   Sulfa Antibiotics Itching and Rash   Sulfasalazine Itching and Rash   Patient Measurements:   Heparin Dosing Weight: 50.9 kg  Vital Signs: Temp: 98 F (36.7 C) (03/11 1428) Temp Source: Oral (03/11 1428) BP: 131/80 (03/11 1749) Pulse Rate: 74 (03/11 1749)  Labs: Recent Labs    04/17/23 1433 04/17/23 1535  HGB 11.5*  --   HCT 37.5  --   PLT 136*  --   CREATININE 2.52*  --   TROPONINIHS  --  39*   Estimated Creatinine Clearance: 14.1 mL/min (A) (by C-G formula based on SCr of 2.52 mg/dL (H)).  Medical History: Past Medical History:  Diagnosis Date   Allergic rhinitis    Arthritis    joints   Cancer (HCC)    Cervical disc disease    Chronic kidney disease    STAGE 3   GERD (gastroesophageal reflux disease)    H/O blood clots    around her 20's   History of asthma    History of colon polyps    History of gout    Hyperlipidemia    Hypertension    Personal history of radiation therapy    PONV (postoperative nausea and vomiting)    nausea   Shingles    Medications:  No PTA anticoagulation per chart review  Baseline Labs Hgb 11.5 Plt 136 INR/PT - ordered  Assessment: LR is a 83 yo female who presented from [facility] with a history of 3 days of SOB and chest pain. They also report umbilical pain with radiation upwards to chest. Imaging of lower unilateral left leg is positive for acute occlusive femoropopliteal DVT. Pharmacy has been consulted to manage this patient heparin.   Goal of Therapy:  Heparin level 0.3-0.7 units/ml Monitor platelets by anticoagulation protocol: Yes   Plan:  Give heparin bolus 3,000 units x 1 Start heparin infusion  at 800 units/hr Check HL in 8 hours Continue to monitor CBC daily with labs.   Thank you for allowing pharmacy to participate in this patient's care.   Effie Shy, PharmD Pharmacy Resident  04/17/2023 6:01 PM

## 2023-04-17 NOTE — Assessment & Plan Note (Addendum)
 Patient reports several day history of black stool with epigastric abdominal pain that always begins right after eating, concerning for gastritis. Last NSAID use over 1 week ago and she uses it very sparingly.  Question if this could be related to recent rounds of steroids. Hemoglobin is stable.  - GI consulted; appreciate their recommendations - Trend H&H - Protonix 40 mg IV twice daily - Clear liquid diet n.p.o. at midnight

## 2023-04-17 NOTE — Assessment & Plan Note (Addendum)
 Patient is presenting with left lower extremity enlargement compared to right of unknown duration, found to have acute occlusive DVT.  No prior history of blood clot with no recent triggering events. Potentially due to Tamoxifen?  - Heparin infusion given possible melena - If no additional episodes, can likely transition to Eliquis versus Xarelto tomorrow - Telemetry monitoring - Hold home tamoxifen

## 2023-04-17 NOTE — ED Provider Notes (Addendum)
 [facility] Provider Note    Event Date/Time   First MD Initiated Contact with Patient 04/17/23 1506     (approximate)   History   Abdominal Pain and Shortness of Breath   HPI  Jane Griffin is a 83 y.o. female who comes in with abdominal pain with melena they recommended coming in for a CT, x-ray patient was seen by Dr. Maia Plan today and was complaining of some increasing shortness of breath, lower abdominal pain, black stools.  Denies ever having this previously.  Patient scent here for evaluation.  She denies any falls or hitting her head.  She does have a little bit of increased swelling on her left leg compared to her right and she reports being on some medications for fluid.  She has had a CT PE that was negative and on review cardiology note from 03/14/2023 it was thought that her shortness of breath was related to pulmonary hypertension.     Physical Exam   Triage Vital Signs: ED Triage Vitals [04/17/23 1428]  Encounter Vitals Group     BP 109/76     Systolic BP Percentile      Diastolic BP Percentile      Pulse Rate 84     Resp 20     Temp 98 F (36.7 C)     Temp Source Oral     SpO2 98 %     Weight      Height      Head Circumference      Peak Flow      Pain Score 7     Pain Loc      Pain Education      Exclude from Growth Chart     Most recent vital signs: Vitals:   04/17/23 1428  BP: 109/76  Pulse: 84  Resp: 20  Temp: 98 F (36.7 C)  SpO2: 98%     General: Awake, no distress.  CV:  Good peripheral perfusion.  Resp:  Normal effort.  Abd:  No distention.  Tender lower abdomen Other:  Trace edema noted bilaterally with left greater than right Dark stool   ED Results / Procedures / Treatments   Labs (all labs ordered are listed, but only abnormal results are displayed) Labs Reviewed  COMPREHENSIVE METABOLIC PANEL - Abnormal; Notable for the following components:      Result Value   CO2 15 (*)    Glucose, Bld 129 (*)     BUN 58 (*)    Creatinine, Ser 2.52 (*)    GFR, Estimated 19 (*)    All other components within normal limits  CBC - Abnormal; Notable for the following components:   WBC 16.6 (*)    Hemoglobin 11.5 (*)    Platelets 136 (*)    All other components within normal limits  POC OCCULT BLOOD, ED  TYPE AND SCREEN     EKG  My interpretation of EKG:  Normal sinus rate of 85 without any ST elevation or T wave inversions, 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   Critical care was time spent personally by me on the following activities:  Development of treatment plan with patient or surrogate, discussions with consultants, evaluation of patient's response to treatment, examination of patient, ordering and review of laboratory studies, ordering and review of radiographic studies, ordering and performing treatments and interventions, pulse oximetry, re-evaluation of patient's condition and review of old charts .1-3 Lead EKG Interpretation  Performed by: Concha Se, MD Authorized by: Concha Se, MD     Interpretation: normal     ECG rate:  70   ECG rate assessment: normal     Rhythm: sinus rhythm     Ectopy: none     Conduction: normal      MEDICATIONS ORDERED IN ED: Medications  pantoprazole (PROTONIX) injection 40 mg (40 mg Intravenous Given 04/17/23 1530)  fentaNYL (SUBLIMAZE) injection 50 mcg (50 mcg Intravenous Given 04/17/23 1530)  ondansetron (ZOFRAN) injection 4 mg (4 mg Intravenous Given 04/17/23 1529)     IMPRESSION /  MDM / ASSESSMENT AND PLAN / ED COURSE  I reviewed the triage vital signs and the nursing notes.   Patient's presentation is most consistent with acute presentation with potential threat to life or bodily function.   Patient comes in with abdominal pain, shortness of breath, melena.  Workup was done to evaluate for GI bleed, acute abdominal pathology such as perforation, appendicitis, ACS, CHF.  Also got ultrasound evaluate for DVT given the left leg appears slightly enlarged compared to the right  Labs do show low bicarb with elevated creatinine.  CBC shows elevated white count and hemoglobin is 11.5  + DVT on [place]   I did do a rectal exam and patient has a dark in stool but really is not staining obviously positive for blood.  She does report using Pepto-Bismol but states that she thinks that the dark stool started before the Pepto-Bismol.  We discussed the pros and cons of heparinization but given her hemoglobin to 11.5 which is higher than her baseline in the setting of dark stools for 4 days I think it would be reasonable to trial heparin with close monitoring of her hemoglobin I will let GI know about patient  Discussed the case with GI Dr Conley Rolls agree that given patient is hemodynamically stable with higher than normal hemoglobin that it be reasonable to trial the heparin.  They will consult on. Recommend Protonix 40 BID.   CT abd pending at time of admission-  I will discuss the hospital team for admission  The patient is on the cardiac monitor to evaluate for evidence of arrhythmia and/or significant heart rate changes.      FINAL CLINICAL IMPRESSION(S) / ED DIAGNOSES   Final diagnoses:  AKI (acute kidney injury) (HCC)  Acute deep vein thrombosis (DVT) of proximal vein of left lower extremity (HCC)     Rx / DC Orders   ED Discharge Orders     None        Note:  This document was prepared using Dragon voice recognition software and may include unintentional  dictation errors.   Concha Se, MD 04/17/23 Aldona Lento    Concha Se, MD 04/17/23 [phone]

## 2023-04-17 NOTE — Assessment & Plan Note (Signed)
 -  Continue home regimen

## 2023-04-18 ENCOUNTER — Observation Stay

## 2023-04-18 ENCOUNTER — Observation Stay
Admit: 2023-04-18 | Discharge: 2023-04-18 | Disposition: A | Discharge status: Home / Self Care | Attending: Obstetrics and Gynecology | Admitting: Obstetrics and Gynecology

## 2023-04-18 DIAGNOSIS — I82412 Acute embolism and thrombosis of left femoral vein: Secondary | ICD-10-CM | POA: Diagnosis not present

## 2023-04-18 LAB — BASIC METABOLIC PANEL
Anion gap: 10 (ref 5–15)
Anion gap: 7 (ref 5–15)
Anion gap: 11 (ref 5–15)
Anion gap: 4 — ABNORMAL LOW (ref 5–15)
BUN: 54 mg/dL — ABNORMAL HIGH (ref 8–23)
BUN: 52 mg/dL — ABNORMAL HIGH (ref 8–23)
BUN: 47 mg/dL — ABNORMAL HIGH (ref 8–23)
BUN: 42 mg/dL — ABNORMAL HIGH (ref 8–23)
CO2: 18 mmol/L — ABNORMAL LOW (ref 22–32)
CO2: 18 mmol/L — ABNORMAL LOW (ref 22–32)
CO2: 16 mmol/L — ABNORMAL LOW (ref 22–32)
CO2: 19 mmol/L — ABNORMAL LOW (ref 22–32)
Calcium: 8.9 mg/dL (ref 8.9–10.3)
Calcium: 8.6 mg/dL — ABNORMAL LOW (ref 8.9–10.3)
Calcium: 8.7 mg/dL — ABNORMAL LOW (ref 8.9–10.3)
Calcium: 8.1 mg/dL — ABNORMAL LOW (ref 8.9–10.3)
Chloride: 110 mmol/L (ref 98–111)
Chloride: 111 mmol/L (ref 98–111)
Chloride: 110 mmol/L (ref 98–111)
Chloride: 111 mmol/L (ref 98–111)
Creatinine, Ser: 2.2 mg/dL — ABNORMAL HIGH (ref 0.44–1.00)
Creatinine, Ser: 2.29 mg/dL — ABNORMAL HIGH (ref 0.44–1.00)
Creatinine, Ser: 2.27 mg/dL — ABNORMAL HIGH (ref 0.44–1.00)
Creatinine, Ser: 2 mg/dL — ABNORMAL HIGH (ref 0.44–1.00)
GFR, Estimated: 22 mL/min — ABNORMAL LOW (ref >60)
GFR, Estimated: 21 mL/min — ABNORMAL LOW (ref >60)
GFR, Estimated: 21 mL/min — ABNORMAL LOW (ref >60)
GFR, Estimated: 25 mL/min — ABNORMAL LOW (ref >60)
Glucose, Bld: 91 mg/dL (ref 70–99)
Glucose, Bld: 107 mg/dL — ABNORMAL HIGH (ref 70–99)
Glucose, Bld: 121 mg/dL — ABNORMAL HIGH (ref 70–99)
Glucose, Bld: 83 mg/dL (ref 70–99)
Potassium: 6.4 mmol/L (ref 3.5–5.1)
Potassium: 6.2 mmol/L — ABNORMAL HIGH (ref 3.5–5.1)
Potassium: 4.8 mmol/L (ref 3.5–5.1)
Potassium: 5.3 mmol/L — ABNORMAL HIGH (ref 3.5–5.1)
Sodium: 138 mmol/L (ref 135–145)
Sodium: 136 mmol/L (ref 135–145)
Sodium: 137 mmol/L (ref 135–145)
Sodium: 134 mmol/L — ABNORMAL LOW (ref 135–145)

## 2023-04-18 LAB — CBG MONITORING, ED: Glucose-Capillary: 122 mg/dL — ABNORMAL HIGH (ref 70–99)

## 2023-04-18 LAB — CBC
HCT: 31.9 % — ABNORMAL LOW (ref 36.0–46.0)
Hemoglobin: 10.3 g/dL — ABNORMAL LOW (ref 12.0–15.0)
MCH: 28.6 pg (ref 26.0–34.0)
MCHC: 32.3 g/dL (ref 30.0–36.0)
MCV: 88.6 fL (ref 80.0–100.0)
Platelets: 123 10*3/uL — ABNORMAL LOW (ref 150–400)
RBC: 3.6 MIL/uL — ABNORMAL LOW (ref 3.87–5.11)
RDW: 14.5 % (ref 11.5–15.5)
WBC: 12.4 10*3/uL — ABNORMAL HIGH (ref 4.0–10.5)
nRBC: 0 % (ref 0.0–0.2)

## 2023-04-18 LAB — HEPARIN LEVEL (UNFRACTIONATED)
Heparin Unfractionated: 1.05 [IU]/mL — ABNORMAL HIGH (ref 0.30–0.70)
Heparin Unfractionated: 0.17 [IU]/mL — ABNORMAL LOW (ref 0.30–0.70)
Heparin Unfractionated: 0.84 [IU]/mL — ABNORMAL HIGH (ref 0.30–0.70)

## 2023-04-18 LAB — GLUCOSE, CAPILLARY: Glucose-Capillary: 146 mg/dL — ABNORMAL HIGH (ref 70–99)

## 2023-04-18 MED ORDER — HEPARIN BOLUS VIA INFUSION
1500.0000 [IU] | Freq: Once | INTRAVENOUS | Status: AC
  Administered 2023-04-18: 1500 [IU] via INTRAVENOUS
  Filled 2023-04-18: qty 1500

## 2023-04-18 MED ORDER — PANTOPRAZOLE SODIUM 40 MG IV SOLR
40.0000 mg | Freq: Two times a day (BID) | INTRAVENOUS | Status: DC
  Administered 2023-04-18 – 2023-04-19 (×3): 40 mg via INTRAVENOUS
  Filled 2023-04-18 (×3): qty 10

## 2023-04-18 MED ORDER — DEXTROSE 50 % IV SOLN
1.0000 | Freq: Once | INTRAVENOUS | Status: AC
  Administered 2023-04-18: 50 mL via INTRAVENOUS
  Filled 2023-04-18: qty 50

## 2023-04-18 MED ORDER — SODIUM BICARBONATE 8.4 % IV SOLN
INTRAVENOUS | Status: DC
Start: 2023-04-18 — End: 2023-04-19
  Filled 2023-04-18: qty 1000
  Filled 2023-04-18: qty 150
  Filled 2023-04-18: qty 1000

## 2023-04-18 MED ORDER — LACTATED RINGERS IV SOLN: INTRAVENOUS | Status: DC

## 2023-04-18 MED ORDER — TAMOXIFEN CITRATE 10 MG PO TABS
20.0000 mg | ORAL_TABLET | Freq: Every day | ORAL | Status: DC
  Administered 2023-04-18: 20 mg via ORAL
  Filled 2023-04-18: qty 2

## 2023-04-18 MED ORDER — INSULIN ASPART 100 UNIT/ML IV SOLN
5.0000 [IU] | Freq: Once | INTRAVENOUS | Status: AC
  Administered 2023-04-18: 5 [IU] via INTRAVENOUS
  Filled 2023-04-18: qty 0.05

## 2023-04-18 MED ORDER — TECHNETIUM TO 99M ALBUMIN AGGREGATED
4.0000 | Freq: Once | INTRAVENOUS | Status: AC | PRN
Start: 2023-04-18 — End: 2023-04-18
  Administered 2023-04-18: 3.7 via INTRAVENOUS

## 2023-04-18 MED ORDER — HEPARIN (PORCINE) 25000 UT/250ML-% IV SOLN
650.0000 [IU]/h | INTRAVENOUS | Status: DC
  Administered 2023-04-18: 750 [IU]/h via INTRAVENOUS
  Filled 2023-04-18: qty 250

## 2023-04-18 NOTE — ED Notes (Signed)
 Patient to nuclear medicine at this time.

## 2023-04-18 NOTE — Progress Notes (Signed)
 PHARMACY - ANTICOAGULATION CONSULT NOTE  Pharmacy Consult for heparin Indication: DVT  Allergies  Allergen Reactions   Codeine Nausea Only   Gabapentin     nightmares   Lipitor [Atorvastatin]     Aches    Pravachol [Pravastatin Sodium]     aches   Tramadol     nausea   Aleve [Naproxen Sodium] Rash   Sulfa Antibiotics Itching and Rash   Sulfasalazine Itching and Rash   Patient Measurements:   Heparin Dosing Weight: 50.9 kg  Vital Signs: Temp: 98.2 F (36.8 C) (03/12 2142) Temp Source: Oral (03/12 2142) BP: 124/61 (03/12 2142) Pulse Rate: 62 (03/12 2142)  Labs: Recent Labs    04/17/23 1433 04/17/23 1535 04/17/23 1734 04/17/23 1822 04/18/23 0250 04/18/23 1112 04/18/23 1313 04/18/23 1400 04/18/23 1810 04/18/23 2218  HGB 11.5*  --   --   --   --  10.3*  --   --   --   --   HCT 37.5  --   --   --   --  31.9*  --   --   --   --   PLT 136*  --   --   --   --  123*  --   --   --   --   LABPROT  --   --   --  16.1*  --   --   --   --   --   --   INR  --   --   --  1.3*  --   --   --   --   --   --   HEPARINUNFRC  --   --   --   --  1.05*  --  0.17*  --   --  0.84*  CREATININE 2.52*  --   --   --   --  2.20*  --  2.29* 2.27* 2.00*  TROPONINIHS  --  39* 41*  --   --   --   --   --   --   --    Estimated Creatinine Clearance: 17.7 mL/min (A) (by C-G formula based on SCr of 2 mg/dL (H)).  Medical History: Past Medical History:  Diagnosis Date   Allergic rhinitis    Arthritis    joints   Cancer (HCC)    Cervical disc disease    Chronic kidney disease    STAGE 3   GERD (gastroesophageal reflux disease)    H/O blood clots    around her 20's   History of asthma    History of colon polyps    History of gout    Hyperlipidemia    Hypertension    Personal history of radiation therapy    PONV (postoperative nausea and vomiting)    nausea   Shingles    Medications:  No PTA anticoagulation per chart review  Baseline Labs Hgb 11.5 Plt 136 INR/PT -  ordered  Assessment: Jane Griffin is a 83 yo female who presented from [facility] with a history of 3 days of SOB and chest pain. They also report umbilical pain with radiation upwards to chest. Imaging of lower unilateral left leg is positive for acute occlusive femoropopliteal DVT. Pharmacy has been consulted to manage this patient heparin.   Hgb 11.5>10.3 (monitor closely due to reports of melena)  Goal of Therapy:  Heparin level 0.3-0.7 units/ml Monitor platelets by anticoagulation protocol: Yes    0312 1313 HL 0.17, subtherapeutic x 1 @  600 u/hr 1914 2215 HL 0.84, elevated @ 750 units/hr  Plan:  3/12:  HL @ 2215 = 0.84, elevated - Will decrease heparin infusion to 650 units/hr Recheck heparin level 8 hours after rate change Daily CBC while on heparin  Thank you for allowing pharmacy to participate in this patient's care.   Jane Griffin D, PharmD 04/18/2023 10:47 PM

## 2023-04-18 NOTE — Care Management Obs Status (Signed)
 MEDICARE OBSERVATION STATUS NOTIFICATION   Patient Details  Name: Jane Griffin MRN: [id number] Date of Birth: 02-23-1940   Medicare Observation Status Notification Given:  Orland Dec, CMA 04/18/2023, 4:15 PM

## 2023-04-18 NOTE — Progress Notes (Addendum)
 PROGRESS NOTE    Jane Griffin  MWN:[id number] DOB: 1940-11-09 DOA: 04/17/2023 PCP: Lauro Regulus, MD Outpatient Specialists: pulm, cardiology    Brief Narrative:   From admission h and p  Jane Griffin is a 83 y.o. female with medical history significant of nonobstructive CAD, pulmonary hypertension, hypertension, hyperlipidemia, mild intermittent asthma, prediabetes, CKD stage IIIb, paroxysmal SVT, who presents to the ED due to abdominal pain, dark stool, shortness of breath.   Jane Griffin states that she has been experiencing dyspnea on exertion for quite some time now but she did feel that it acutely worsened earlier today.  She notes that this worsening occurred when she was having severe abdominal pain and now her breathing seems back to normal.  She is quite worried on what has been causing her DOE, especially since she is not quite gotten a definitive answer.   Then 4 days ago, she developed epigastric abdominal pain that would occur specifically right after eating or drinking.  This has led to poor p.o. intake.  She has also developed melanotic stool with no hematochezia.  Last NSAID use was over 1 week ago and she uses very sparingly, as she usually takes Tylenol for pain.   [name] notes that earlier today, she noticed that her left leg was more swollen than her right.  Usually her legs are equally swollen at the ankles.  She endorses a prior history of DVT when she was very young after starting birth control.  No VTE since then.  Assessment & Plan:   Principal Problem:   Acute DVT (deep venous thrombosis) (HCC) Active Problems:   Melena   Shortness of breath   Acute kidney injury superimposed on chronic kidney disease (HCC)   PHT (pulmonary hypertension) (HCC)   Invasive ductal carcinoma of left breast (HCC)   Asthma   Chronic gouty arthritis   Coronary artery disease due to calcified coronary lesion  # Acute left fempop DVT Breast cancer may be provoking  etiology. Tamoxifen may also be culprit. Also reports distant hx of dvt in setting of OCPs - continue heparin - f/u vq scan - bleeding as below  # melena Reports several days melena, also history of hemorrhoids and several days of small amount of brb when wiping. Hgb 11.5 on presentation stable from priors. On asa but denies nsaids. Did come in with abd pain so ulcer, etc. A possibility. - gi to see - tentative plan for egd later today - start ppi IV bid  # Abdominal pain Resolved, ct abdomen nothing acute - endoscopy as above  # aki on ckd 3b Cr 2.52 with metabolic acidosis, etiology unclear as reports normal po, no diarrhea or vomiting. Is on hydrochlorothiazide and lomesartan at home - continue fluids - renal u/s if fails to improve with fluids - holding home olmesartan/hydrochlorothiazide  # Breast cancer S/p xrt, currently on tamoxifen - hold tamoxifen (per dr. Orlie Dakin, ok to resume if patient is continued on an anticoagulant)  # CAD  On ct, negative nuclear stress last year, no chest pain - holding home asa - cont home statin, metop  # Lung nodule Probably incidental - outpt pulm f/u (is established w/ dr. Richardson Dopp)   DVT prophylaxis: heparin IV Code Status: full Family Communication: son updated @ bedside 3/12  Level of care: Telemetry Medical Status is: Observation    Consultants:  gi  Procedures: Pending upper endoscopy  Antimicrobials:  none    Subjective: Reports resolution of abd pain and  dyspnea. Last bm yesterday morning  Objective: Vitals:   04/17/23 2136 04/17/23 2300 04/18/23 0429 04/18/23 0811  BP: 130/64 106/63 (!) 114/48 123/65  Pulse: 76 61 66 (!) 55  Resp: (!) 22 14 20 16   Temp: 97.8 F (36.6 C)  (!) 97.5 F (36.4 C) 98.5 F (36.9 C)  TempSrc: Temporal  Temporal Oral  SpO2: 99% 98% 96% 98%    Intake/Output Summary (Last 24 hours) at 04/18/2023 1121 Last data filed at 04/18/2023 0856 Gross per 24 hour  Intake 26.25 ml   Output --  Net 26.25 ml   There were no vitals filed for this visit.  Examination:  General exam: Appears calm and comfortable  Respiratory system: Clear to auscultation. Respiratory effort normal. Cardiovascular system: S1 & S2 heard, RRR. No JVD, murmurs, rubs, gallops or clicks. No pedal edema. Gastrointestinal system: Abdomen is nondistended, soft and nontender.   Central nervous system: Alert and oriented. No focal neurological deficits. Extremities: left lower extremity swelling Skin: No rashes, lesions or ulcers Psychiatry: Judgement and insight appear normal. Mood & affect appropriate.     Data Reviewed: I have personally reviewed following labs and imaging studies  CBC: Recent Labs  Lab 04/17/23 1433  WBC 16.6*  HGB 11.5*  HCT 37.5  MCV 93.3  PLT 136*   Basic Metabolic Panel: Recent Labs  Lab 04/17/23 1433  NA 137  K 5.1  CL 109  CO2 15*  GLUCOSE 129*  BUN 58*  CREATININE 2.52*  CALCIUM 9.3   GFR: Estimated Creatinine Clearance: 14.1 mL/min (A) (by C-G formula based on SCr of 2.52 mg/dL (H)). Liver Function Tests: Recent Labs  Lab 04/17/23 1433  AST 25  ALT 17  ALKPHOS 38  BILITOT 0.8  PROT 6.7  ALBUMIN 3.5   No results for input(s): "LIPASE", "AMYLASE" in the last 168 hours. No results for input(s): "AMMONIA" in the last 168 hours. Coagulation Profile: Recent Labs  Lab 04/17/23 1822  INR 1.3*   Cardiac Enzymes: No results for input(s): "CKTOTAL", "CKMB", "CKMBINDEX", "TROPONINI" in the last 168 hours. BNP (last 3 results) No results for input(s): "PROBNP" in the last 8760 hours. HbA1C: No results for input(s): "HGBA1C" in the last 72 hours. CBG: No results for input(s): "GLUCAP" in the last 168 hours. Lipid Profile: No results for input(s): "CHOL", "HDL", "LDLCALC", "TRIG", "CHOLHDL", "LDLDIRECT" in the last 72 hours. Thyroid Function Tests: No results for input(s): "TSH", "T4TOTAL", "FREET4", "T3FREE", "THYROIDAB" in the last 72  hours. Anemia Panel: No results for input(s): "VITAMINB12", "FOLATE", "FERRITIN", "TIBC", "IRON", "RETICCTPCT" in the last 72 hours. Urine analysis: No results found for: "COLORURINE", "APPEARANCEUR", "LABSPEC", "PHURINE", "GLUCOSEU", "HGBUR", "BILIRUBINUR", "KETONESUR", "PROTEINUR", "UROBILINOGEN", "NITRITE", "LEUKOCYTESUR" Sepsis Labs: @LABRCNTIP (procalcitonin:4,lacticidven:4)  ) Recent Results (from the past 240 hours)  Resp panel by RT-PCR (RSV, Flu A&B, Covid) Anterior Nasal Swab     Status: None   Collection Time: 04/17/23  3:35 PM   Specimen: Anterior Nasal Swab  Result Value Ref Range Status   SARS Coronavirus 2 by RT PCR NEGATIVE NEGATIVE Final    Comment: (NOTE) SARS-CoV-2 target nucleic acids are NOT DETECTED.  The SARS-CoV-2 RNA is generally detectable in upper respiratory specimens during the acute phase of infection. The lowest concentration of SARS-CoV-2 viral copies this assay can detect is 138 copies/mL. A negative result does not preclude SARS-Cov-2 infection and should not be used as the sole basis for treatment or other patient management decisions. A negative result may occur with  improper specimen collection/handling,  submission of specimen other than nasopharyngeal swab, presence of viral mutation(s) within the areas targeted by this assay, and inadequate number of viral copies(<138 copies/mL). A negative result must be combined with clinical observations, patient history, and epidemiological information. The expected result is Negative.  Fact Sheet for Patients:  BloggerCourse.com  Fact Sheet for Healthcare Providers:  SeriousBroker.it  This test is no t yet approved or cleared by the [place] FDA and  has been authorized for detection and/or diagnosis of SARS-CoV-2 by FDA under an Emergency Use Authorization (EUA). This EUA will remain  in effect (meaning this test can be used) for the duration of  the COVID-19 declaration under Section 564(b)(1) of the Act, 21 U.S.C.section 360bbb-3(b)(1), unless the authorization is terminated  or revoked sooner.       Influenza A by PCR NEGATIVE NEGATIVE Final   Influenza B by PCR NEGATIVE NEGATIVE Final    Comment: (NOTE) The Xpert Xpress SARS-CoV-2/FLU/RSV plus assay is intended as an aid in the diagnosis of influenza from Nasopharyngeal swab specimens and should not be used as a sole basis for treatment. Nasal washings and aspirates are unacceptable for Xpert Xpress SARS-CoV-2/FLU/RSV testing.  Fact Sheet for Patients: BloggerCourse.com  Fact Sheet for Healthcare Providers: SeriousBroker.it  This test is not yet approved or cleared by the [place] FDA and has been authorized for detection and/or diagnosis of SARS-CoV-2 by FDA under an Emergency Use Authorization (EUA). This EUA will remain in effect (meaning this test can be used) for the duration of the COVID-19 declaration under Section 564(b)(1) of the Act, 21 U.S.C. section 360bbb-3(b)(1), unless the authorization is terminated or revoked.     Resp Syncytial Virus by PCR NEGATIVE NEGATIVE Final    Comment: (NOTE) Fact Sheet for Patients: BloggerCourse.com  Fact Sheet for Healthcare Providers: SeriousBroker.it  This test is not yet approved or cleared by the [place] FDA and has been authorized for detection and/or diagnosis of SARS-CoV-2 by FDA under an Emergency Use Authorization (EUA). This EUA will remain in effect (meaning this test can be used) for the duration of the COVID-19 declaration under Section 564(b)(1) of the Act, 21 U.S.C. section 360bbb-3(b)(1), unless the authorization is terminated or revoked.  Performed at [facility], [address] [place], [place] [address]          Radiology Studies: CT ABDOMEN PELVIS WO  CONTRAST Result Date: 04/17/2023 CLINICAL DATA:  Abdomen pain EXAM: CT ABDOMEN AND PELVIS WITHOUT CONTRAST TECHNIQUE: Multidetector CT imaging of the abdomen and pelvis was performed following the standard protocol without IV contrast. RADIATION DOSE REDUCTION: This exam was performed according to the departmental dose-optimization program which includes automated exposure control, adjustment of the mA and/or kV according to patient size and/or use of iterative reconstruction technique. COMPARISON:  Chest x-ray 04/17/2023, CT abdomen pelvis 09/16/2015, chest CT 12/04/2022 FINDINGS: Lower chest: Lung bases demonstrate mild scarring at the lingula and left base. Negative for pleural effusion. Focal nodular airspace disease at the periphery of the right base measuring 8 mm on series 4, image 33. Coronary vascular calcification Hepatobiliary: No focal liver abnormality is seen. No gallstones, gallbladder wall thickening, or biliary dilatation. Pancreas: Unremarkable. No pancreatic ductal dilatation or surrounding inflammatory changes. Spleen: Normal in size without focal abnormality. Adrenals/Urinary Tract: Adrenal glands are normal. Kidneys show no hydronephrosis. The bladder is unremarkable. Stomach/Bowel: Stomach nonenlarged. No dilated small bowel. No acute bowel wall thickening. Mild diverticular disease of the left colon. Nonspecific hyperdense material within the distal colon.  Vascular/Lymphatic: Aortic atherosclerosis. No enlarged abdominal or pelvic lymph nodes. Reproductive: Status post hysterectomy. No adnexal masses. Other: Negative for pelvic effusion or free air. Fat containing bilateral inguinal hernias, on the right some hazy internal density is noted. No bowel containing hernia Musculoskeletal: Moderate chronic compression deformity at T11. IMPRESSION: 1. No CT evidence for acute intra-abdominal or pelvic abnormality. 2. Mild diverticular disease of the left colon without acute inflammatory process. 3.  Fat containing bilateral inguinal hernias, on the right some hazy internal density is noted which may be due to edema or inflammation. No bowel containing hernia. 4. 8 mm nodular airspace disease at the periphery of the right base. Non-contrast chest CT at 6-12 months is recommended. If the nodule is stable at time of repeat CT, then future CT at 18-24 months (from today's scan) is considered optional for low-risk patients, but is recommended for high-risk patients. This recommendation follows the consensus statement: Guidelines for Management of Incidental Pulmonary Nodules Detected on CT Images: From the Fleischner Society 2017; Radiology 2017; 284:228-243. 5. Aortic atherosclerosis. Electronically Signed   By: Jasmine Pang M.D.   On: 04/17/2023 18:19   US Venous Img Lower Unilateral Left Result Date: 04/17/2023 CLINICAL DATA:  leg swellin EXAM: LEFT LOWER EXTREMITY VENOUS DOPPLER ULTRASOUND TECHNIQUE: Gray-scale sonography with compression, as well as color and duplex ultrasound, were performed to evaluate the deep venous system(s) from the level of the common femoral vein through the popliteal and proximal calf veins. COMPARISON:  CT AP, 04/17/2023 FINDINGS: VENOUS Normal compressibility of the common femoral, and the visualized portions of profunda femoral and great saphenous vein. Heterogeneously-echogenic, near-occlusive filling defect within and incomplete compressibility of the imaged portions of the femoral and popliteal veins. The imaged calf veins appear patent. Limited views of the contralateral common femoral vein are unremarkable. OTHER No evidence of superficial thrombophlebitis or abnormal fluid collection. Limitations: none IMPRESSION: Acute, occlusive LEFT femoropopliteal DVT. Roanna Banning, MD [facility] Radiology Electronically Signed   By: Roanna Banning M.D.   On: 04/17/2023 17:25   DG Chest 2 View Result Date: 04/17/2023 CLINICAL DATA:   Shortness of breath.  Melena.  Abdominal pain EXAM: CHEST - 2 VIEW COMPARISON:  X-ray and CTA chest 12/04/2022. FINDINGS: No consolidation, pneumothorax or effusion. No edema. Normal cardiopericardial silhouette. Dense right upper lung nodule consistent with old granulomatous disease and calcified focus. Osteopenia and degenerative changes along the spine. IMPRESSION: Chronic changes.  No acute cardiopulmonary disease. Electronically Signed   By: Karen Kays M.D.   On: 04/17/2023 17:11        Scheduled Meds:  febuxostat  80 mg Oral Daily   metoprolol tartrate  25 mg Oral BID   pravastatin  40 mg Oral q1800   sodium chloride flush  3 mL Intravenous Q12H   Continuous Infusions:  heparin Stopped (04/18/23 1111)     LOS: 0 days     Silvano Bilis, MD [facility]   If 7PM-7AM, please contact night-coverage [email or web address] Password [facility] 04/18/2023, 11:21 AM

## 2023-04-18 NOTE — Progress Notes (Signed)
 PHARMACY - ANTICOAGULATION CONSULT NOTE  Pharmacy Consult for heparin Indication: DVT  Allergies  Allergen Reactions   Codeine Nausea Only   Gabapentin     nightmares   Lipitor [Atorvastatin]     Aches    Pravachol [Pravastatin Sodium]     aches   Tramadol     nausea   Aleve [Naproxen Sodium] Rash   Sulfa Antibiotics Itching and Rash   Sulfasalazine Itching and Rash   Patient Measurements:   Heparin Dosing Weight: 50.9 kg  Vital Signs: Temp: 99.3 F (37.4 C) (03/12 1222) Temp Source: Oral (03/12 1222) BP: 122/82 (03/12 1000) Pulse Rate: 61 (03/12 1000)  Labs: Recent Labs    04/17/23 1433 04/17/23 1535 04/17/23 1734 04/17/23 1822 04/18/23 0250 04/18/23 1112 04/18/23 1313  HGB 11.5*  --   --   --   --  10.3*  --   HCT 37.5  --   --   --   --  31.9*  --   PLT 136*  --   --   --   --  123*  --   LABPROT  --   --   --  16.1*  --   --   --   INR  --   --   --  1.3*  --   --   --   HEPARINUNFRC  --   --   --   --  1.05*  --  0.17*  CREATININE 2.52*  --   --   --   --  2.20*  --   TROPONINIHS  --  39* 41*  --   --   --   --    Estimated Creatinine Clearance: 16.1 mL/min (A) (by C-G formula based on SCr of 2.2 mg/dL (H)).  Medical History: Past Medical History:  Diagnosis Date   Allergic rhinitis    Arthritis    joints   Cancer (HCC)    Cervical disc disease    Chronic kidney disease    STAGE 3   GERD (gastroesophageal reflux disease)    H/O blood clots    around her 20's   History of asthma    History of colon polyps    History of gout    Hyperlipidemia    Hypertension    Personal history of radiation therapy    PONV (postoperative nausea and vomiting)    nausea   Shingles    Medications:  No PTA anticoagulation per chart review  Baseline Labs Hgb 11.5 Plt 136 INR/PT - ordered  Assessment: Jane Griffin is a 83 yo female who presented from [place] [facility] with a history of 3 days of SOB and chest pain. They also report umbilical pain with radiation  upwards to chest. Imaging of lower unilateral left leg is positive for acute occlusive femoropopliteal DVT. Pharmacy has been consulted to manage this patient heparin.   Hgb 11.5>10.3 (monitor closely due to reports of melena)  Goal of Therapy:  Heparin level 0.3-0.7 units/ml Monitor platelets by anticoagulation protocol: Yes    0312 1313 HL 0.17, subtherapeutic x 1 @ 600 u/hr  Plan:  Heparin level subtherapeutic x 1  Give heparin 1500 units IV x 1 Increase heparin infusion rate to 750 units/hr Recheck heparin level 8 hours after rate change Daily CBC while on heparin  Thank you for allowing pharmacy to participate in this patient's care.   Barrie Folk, PharmD 04/18/2023 1:45 PM

## 2023-04-18 NOTE — ED Notes (Signed)
 Wouk, MD, made aware of potassium 6.4

## 2023-04-18 NOTE — Progress Notes (Signed)
 PHARMACY - ANTICOAGULATION CONSULT NOTE  Pharmacy Consult for heparin Indication: DVT  Allergies  Allergen Reactions   Codeine Nausea Only   Gabapentin     nightmares   Lipitor [Atorvastatin]     Aches    Pravachol [Pravastatin Sodium]     aches   Tramadol     nausea   Aleve [Naproxen Sodium] Rash   Sulfa Antibiotics Itching and Rash   Sulfasalazine Itching and Rash   Patient Measurements:   Heparin Dosing Weight: 50.9 kg  Vital Signs: Temp: 97.8 F (36.6 C) (03/11 2136) Temp Source: Temporal (03/11 2136) BP: 106/63 (03/11 2300) Pulse Rate: 61 (03/11 2300)  Labs: Recent Labs    04/17/23 1433 04/17/23 1535 04/17/23 1734 04/17/23 1822 04/18/23 0250  HGB 11.5*  --   --   --   --   HCT 37.5  --   --   --   --   PLT 136*  --   --   --   --   LABPROT  --   --   --  16.1*  --   INR  --   --   --  1.3*  --   HEPARINUNFRC  --   --   --   --  1.05*  CREATININE 2.52*  --   --   --   --   TROPONINIHS  --  39* 41*  --   --    Estimated Creatinine Clearance: 14.1 mL/min (A) (by C-G formula based on SCr of 2.52 mg/dL (H)).  Medical History: Past Medical History:  Diagnosis Date   Allergic rhinitis    Arthritis    joints   Cancer (HCC)    Cervical disc disease    Chronic kidney disease    STAGE 3   GERD (gastroesophageal reflux disease)    H/O blood clots    around her 20's   History of asthma    History of colon polyps    History of gout    Hyperlipidemia    Hypertension    Personal history of radiation therapy    PONV (postoperative nausea and vomiting)    nausea   Shingles    Medications:  No PTA anticoagulation per chart review  Baseline Labs Hgb 11.5 Plt 136 INR/PT - ordered  Assessment: Jane Griffin is a 83 yo female who presented from [place] [facility] with a history of 3 days of SOB and chest pain. They also report umbilical pain with radiation upwards to chest. Imaging of lower unilateral left leg is positive for acute occlusive femoropopliteal DVT.  Pharmacy has been consulted to manage this patient heparin.   Goal of Therapy:  Heparin level 0.3-0.7 units/ml Monitor platelets by anticoagulation protocol: Yes   Plan:  3/12:  HL @ 0205 = 1.05, elevated - Will hold heparin drip for 1 hr and restart at 600 units/hr @ 0430. - Will recheck HL 8 hrs after restart  Continue to monitor CBC daily with labs.   Thank you for allowing pharmacy to participate in this patient's care.   Jane Griffin D, PharmD 04/18/2023 3:42 AM

## 2023-04-18 NOTE — Consult Note (Signed)
 GI Inpatient Consult Note  Reason for Consult: Melena, epigastric abdominal pain    Attending Requesting Consult: Dr. Verdene Lennert, MD  History of Present Illness: Jane Griffin is a 83 y.o. female seen for evaluation of melena and epigastric abdominal pain at the request of admitting hospitalist - Dr. Verdene Lennert. Patient has a PMH of HTN, HLD, mild intermittent asthma, prediabetes, CKD Stage IIIb, hx of breast cancer, pSVT, nonobstructive CAD, and pHTN. She presented to the [facility] ED yesterday afternoon for chief complaint of epigastric abdominal pain with melena, shortness of breath, and chest pain x 4-days. She was seen as initial consult by Dr. Hazle Quant at [facility] for possible inguinal hernia, but was advised to come to the ED due to her acute symptoms. Upon presentation to the ED, all vital signs were stable. Labs notable for WBC 16.6K, hemoglobin 11.5, platelets 136K, BUN 58, serum creatinine 2.52, GFR 19, and serum electrolytes within normal limits. Lower extremity doppler ultrasound positive for left femoral-popliteal DVT. CT abd/pelvis without any acute intra-abdominopelvic pathologies. She was started on IV heparin and admitted to the floor. GI consulted for melena and epigastric abdominal pain.   Patient seen and examined this afternoon resting comfortably in ED bed. She reports she just got back from her perfusion scan. Her last episode of melena was yesterday morning before coming to the hospital. She denies any gross hematochezia. She denies any nausea, vomiting, esophageal dysphagia, odynophagia, hoarseness, or loss of appetite. No increased GERD symptoms. She denies any previous endoscopy. No history of GI bleeding. She reports she does not regularly take NSAIDs. She does complain of left lower leg swelling. She has hx of DVT when she was in her 65s after starting oral birth control. No issues with VTEs since that time up until now. Hemoglobin has remained  stable.   Summary of GI Procedures:  CS[facility] 10/11/2016 Evette Cristal) - sigmoid diverticulosis, one 2 mm TA from cecum biopsied, one 5 mm TA removed from IC valve, one 10 mm TA removed from proximal ascending colon  [facility] 01/25/2011 ([place]) - normal examined colon   Past Medical History:  Past Medical History:  Diagnosis Date   Allergic rhinitis    Arthritis    joints   Cancer (HCC)    Cervical disc disease    Chronic kidney disease    STAGE 3   GERD (gastroesophageal reflux disease)    H/O blood clots    around her 20's   History of asthma    History of colon polyps    History of gout    Hyperlipidemia    Hypertension    Personal history of radiation therapy    PONV (postoperative nausea and vomiting)    nausea   Shingles     Problem List: Patient Active Problem List   Diagnosis Date Noted   Acute DVT (deep venous thrombosis) (HCC) 04/17/2023   Acute kidney injury superimposed on chronic kidney disease (HCC) 04/17/2023   Melena 04/17/2023   Asthma 04/17/2023   Shortness of breath 04/17/2023   Moderate tricuspid regurgitation 12/12/2022   Moderate mitral regurgitation 12/12/2022   PHT (pulmonary hypertension) (HCC) 12/12/2022   SVT (supraventricular tachycardia) (HCC) 12/12/2022   NSTEMI (non-ST elevated myocardial infarction) (HCC) 12/05/2022   Hypomagnesemia 12/05/2022   Hypokalemia 12/05/2022   Hypocalcemia 12/05/2022   Postural dizziness with presyncope 12/05/2022   Anemia 12/05/2022   Metabolic acidosis 12/05/2022   Cough 12/05/2022   Grief at loss of child 12/05/2022  Chest tightness 12/05/2022   Elevated troponin 12/05/2022   Chronic kidney disease    Chronic gouty arthritis 08/17/2022   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   B12 deficiency 03/02/2021   Prediabetes 03/02/2021   Neurocardiogenic pre-syncope 06/14/2017   Atherosclerosis of abdominal aorta (HCC) 10/27/2015   Ventral hernia without obstruction or gangrene 06/16/2015   Elevated  serum creatinine 06/16/2015   Ovarian mass, right 06/16/2015   Upper airway cough syndrome 03/14/2012   GERD (gastroesophageal reflux disease) 04/11/2011    Past Surgical History: Past Surgical History:  Procedure Laterality Date   ABDOMINAL HYSTERECTOMY     BREAST BIOPSY Left 10/26/2021   [place] Core Bx ribbon clip positive   BREAST LUMPECTOMY Left 11/11/2021   Procedure: BREAST LUMPECTOMY;  Surgeon: Earline Mayotte, MD;  Location: [facility];  Service: General;  Laterality: Left;   BREAST LUMPECTOMY Left 11/11/2021   invasive lobular ca/neg margins/closest margin .3 mm   COLONOSCOPY  2013   COLONOSCOPY WITH PROPOFOL N/A 10/11/2016   Procedure: COLONOSCOPY WITH PROPOFOL;  Surgeon: Kieth Brightly, MD;  Location: [facility];  Service: Endoscopy;  Laterality: N/A;   DISTAL INTERPHALANGEAL JOINT FUSION Right 08/08/2022   Procedure: Right middle finger DISTAL INTERPHALANGEAL JOINT FUSION;  Surgeon: Kennedy Bucker, MD;  Location: [facility];  Service: Orthopedics;  Laterality: Right;   ELBOW SURGERY Left    EYE SURGERY     both eyes cataract removed   FEMORAL HERNIA REPAIR Right 09/18/2015   Procedure: HERNIA REPAIR femoral  ADULT;  Surgeon: Kieth Brightly, MD;  Location: [facility] OR[facility];  Service: General;  Laterality: Right;   HERNIA REPAIR     LAPAROSCOPIC BILATERAL SALPINGO OOPHERECTOMY Bilateral 09/06/2015   Procedure: LAPAROSCOPIC BILATERAL SALPINGO OOPHORECTOMY;  Surgeon: Suzy Bouchard, MD;  Location: [facility];  Service: Gynecology;  Laterality: Bilateral;   PARTIAL HYSTERECTOMY     TUBAL LIGATION     UPPER GI ENDOSCOPY     WRIST SURGERY Right     Allergies: Allergies  Allergen Reactions   Codeine Nausea Only   Gabapentin     nightmares   Lipitor [Atorvastatin]     Aches    Pravachol [Pravastatin Sodium]     aches   Tramadol     nausea   Aleve [Naproxen Sodium] Rash   Sulfa Antibiotics Itching and Rash   Sulfasalazine Itching and Rash    Home  Medications: (Not in a hospital admission)  Home medication reconciliation was completed with the patient.   Scheduled Inpatient Medications:    febuxostat  80 mg Oral Daily   metoprolol tartrate  25 mg Oral BID   pravastatin  40 mg Oral q1800   sodium chloride flush  3 mL Intravenous Q12H    Continuous Inpatient Infusions:    heparin 600 Units/hr (04/18/23 0433)    PRN Inpatient Medications:  albuterol, HYDROmorphone (DILAUDID) injection, ondansetron **OR** ondansetron (ZOFRAN) IV  Family History: family history includes Breast cancer (age of onset: 18) in her daughter; Cancer in her daughter; Clotting disorder in her mother; Emphysema in her father; Heart failure in her father and mother.  The patient's family history is negative for inflammatory bowel disorders, GI malignancy, or solid organ transplantation.  Social History:   reports that she has never smoked. She has never used smokeless tobacco. She reports that she does not drink alcohol and does not use drugs. The patient denies ETOH, tobacco, or drug use.   Review of Systems: Constitutional: Weight is stable.  Eyes: No changes in vision. ENT: No oral lesions, sore throat.  GI: see HPI.  Heme/Lymph: No easy bruising.  CV: No chest pain.  GU: No hematuria.  Integumentary: No rashes.  Neuro: No headaches.  Psych: No depression/anxiety.  Endocrine: No heat/cold intolerance.  Allergic/Immunologic: No urticaria.  Resp: No cough, SOB.  Musculoskeletal: No joint swelling.    Physical Examination: BP 123/65   Pulse (!) 55   Temp 98.5 F (36.9 C) (Oral)   Resp 16   SpO2 98%  Gen: NAD, alert and oriented x 4 HEENT: PEERLA, EOMI, Neck: supple, no JVD or thyromegaly Chest: CTA bilaterally, no wheezes, crackles, or other adventitious sounds CV: RRR, no m/g/c/r Abd: soft, NT, ND, +BS in all four quadrants; no HSM, guarding, ridigity, or rebound tenderness Ext: no edema, well perfused with 2+ pulses, Skin: no rash or  lesions noted Lymph: no LAD  Data: Lab Results  Component Value Date   WBC 16.6 (H) 04/17/2023   HGB 11.5 (L) 04/17/2023   HCT 37.5 04/17/2023   MCV 93.3 04/17/2023   PLT 136 (L) 04/17/2023   Recent Labs  Lab 04/17/23 1433  HGB 11.5*   Lab Results  Component Value Date   NA 137 04/17/2023   K 5.1 04/17/2023   CL 109 04/17/2023   CO2 15 (L) 04/17/2023   BUN 58 (H) 04/17/2023   CREATININE 2.52 (H) 04/17/2023   Lab Results  Component Value Date   ALT 17 04/17/2023   AST 25 04/17/2023   ALKPHOS 38 04/17/2023   BILITOT 0.8 04/17/2023   Recent Labs  Lab 04/17/23 1822  INR 1.3*   CT abd/pelvis without contrast 04/17/2023: IMPRESSION: 1. No CT evidence for acute intra-abdominal or pelvic abnormality. 2. Mild diverticular disease of the left colon without acute inflammatory process. 3. Fat containing bilateral inguinal hernias, on the right some hazy internal density is noted which may be due to edema or inflammation. No bowel containing hernia. 4. 8 mm nodular airspace disease at the periphery of the right base. Non-contrast chest CT at 6-12 months is recommended. If the nodule is stable at time of repeat CT, then future CT at 18-24 months (from today's scan) is considered optional for low-risk patients, but is recommended for high-risk patients. This recommendation follows the consensus statement: Guidelines for Management of Incidental Pulmonary Nodules Detected on CT Images: From the Fleischner Society 2017; Radiology 2017; 284:228-243. 5. Aortic atherosclerosis.    Assessment/Plan:  83 y/o Caucasian female with a PMH of HTN, HLD, mild intermittent asthma, prediabetes, CKD Stage IIIb, hx of breast cancer, pSVT, nonobstructive CAD, and pHTN presented to the [facility] ED 3/11 for chief complaint of abdominal pain, melena, and shortness of breath. Patient was found to have acute DVT left lower extremity and started on heparin. NM Perfusion scan this afternoon with  findings suspicious for pulmonary embolism. GI consulted for potential UGIB.   Epigastric abdominal pain/Melena - symptomology concerning for potential UGIB. H&H is stable currently with no overt gastrointestinal bleeding. DDx includes peptic ulcer disease, gastritis, esophagitis, AVMs, neoplasm, polyp, GAVE, etc.   Acute DVT/Probably PE  AKI on CKD Stage IIIb  Hx of breast cancer  Hyperkalemia   Recommendations:  - Maintain 2 large bore IVs for access - No signs of overt gastrointestinal bleeding. Continue to monitor for signs of GIB.  - H&H stable currently. Continue to monitor serial H&H. Transfuse for Hgb <7.0. - Continue Protonix 40 mg IV BID for gastric protection - EGD when clinically  feasible, ideally after further plan of care is made with findings of acute DVT/PE (thrombectomy, etc). Advise getting Vascular on board. - GI will continue to follow along with you - Full liquid diet for now. ADAT.  - Recommend continuing anticoagulation at this time per primary/vascular   I reviewed the risks (including bleeding, perforation, infection, anesthesia complications, cardiac/respiratory complications), benefits and alternatives of EGD. Patient consents to proceed.    Thank you for the consult. Please call with questions or concerns.  Gilda Crease, PA-C [facility] [phone]

## 2023-04-19 ENCOUNTER — Observation Stay: Admitting: Anesthesiology

## 2023-04-19 ENCOUNTER — Encounter
Admission: EM | Disposition: A | Discharge status: Home / Self Care | Payer: Self-pay | Source: Home / Self Care | Attending: Emergency Medicine

## 2023-04-19 ENCOUNTER — Observation Stay

## 2023-04-19 ENCOUNTER — Encounter: Payer: Self-pay | Admitting: Internal Medicine

## 2023-04-19 DIAGNOSIS — I82412 Acute embolism and thrombosis of left femoral vein: Secondary | ICD-10-CM | POA: Diagnosis not present

## 2023-04-19 HISTORY — PX: ESOPHAGOGASTRODUODENOSCOPY: SHX5428

## 2023-04-19 LAB — BASIC METABOLIC PANEL
Anion gap: 9 (ref 5–15)
BUN: 37 mg/dL — ABNORMAL HIGH (ref 8–23)
CO2: 23 mmol/L (ref 22–32)
Calcium: 8.3 mg/dL — ABNORMAL LOW (ref 8.9–10.3)
Chloride: 105 mmol/L (ref 98–111)
Creatinine, Ser: 1.9 mg/dL — ABNORMAL HIGH (ref 0.44–1.00)
GFR, Estimated: 26 mL/min — ABNORMAL LOW (ref >60)
Glucose, Bld: 105 mg/dL — ABNORMAL HIGH (ref 70–99)
Potassium: 5 mmol/L (ref 3.5–5.1)
Sodium: 137 mmol/L (ref 135–145)

## 2023-04-19 LAB — CBC
HCT: 26.9 % — ABNORMAL LOW (ref 36.0–46.0)
Hemoglobin: 8.9 g/dL — ABNORMAL LOW (ref 12.0–15.0)
MCH: 28.3 pg (ref 26.0–34.0)
MCHC: 33.1 g/dL (ref 30.0–36.0)
MCV: 85.4 fL (ref 80.0–100.0)
Platelets: 107 10*3/uL — ABNORMAL LOW (ref 150–400)
RBC: 3.15 MIL/uL — ABNORMAL LOW (ref 3.87–5.11)
RDW: 14.3 % (ref 11.5–15.5)
WBC: 9.1 10*3/uL (ref 4.0–10.5)
nRBC: 0 % (ref 0.0–0.2)

## 2023-04-19 LAB — ECHOCARDIOGRAM COMPLETE
Area-P 1/2: 3.53 cm2
S' Lateral: 2.3 cm

## 2023-04-19 LAB — HEPARIN LEVEL (UNFRACTIONATED)
Heparin Unfractionated: 0.73 [IU]/mL — ABNORMAL HIGH (ref 0.30–0.70)
Heparin Unfractionated: 0.29 [IU]/mL — ABNORMAL LOW (ref 0.30–0.70)

## 2023-04-19 SURGERY — EGD (ESOPHAGOGASTRODUODENOSCOPY)
Anesthesia: General

## 2023-04-19 MED ORDER — PROPOFOL 1000 MG/100ML IV EMUL
INTRAVENOUS | Status: AC
  Filled 2023-04-19: qty 100

## 2023-04-19 MED ORDER — LIDOCAINE HCL (CARDIAC) PF 100 MG/5ML IV SOSY
PREFILLED_SYRINGE | INTRAVENOUS | Status: DC | PRN
  Administered 2023-04-19: 60 mg via INTRAVENOUS

## 2023-04-19 MED ORDER — LIDOCAINE HCL (PF) 2 % IJ SOLN
INTRAMUSCULAR | Status: AC
  Filled 2023-04-19: qty 5

## 2023-04-19 MED ORDER — PHENYLEPHRINE 80 MCG/ML (10ML) SYRINGE FOR IV PUSH (FOR BLOOD PRESSURE SUPPORT)
PREFILLED_SYRINGE | INTRAVENOUS | Status: AC
  Filled 2023-04-19: qty 10

## 2023-04-19 MED ORDER — ONDANSETRON HCL 4 MG/2ML IJ SOLN
INTRAMUSCULAR | Status: AC
Start: 2023-04-19 — End: ?
  Filled 2023-04-19: qty 2

## 2023-04-19 MED ORDER — SODIUM CHLORIDE 0.9 % IV SOLN: INTRAVENOUS | Status: DC

## 2023-04-19 MED ORDER — PHENYLEPHRINE 80 MCG/ML (10ML) SYRINGE FOR IV PUSH (FOR BLOOD PRESSURE SUPPORT)
PREFILLED_SYRINGE | INTRAVENOUS | Status: DC | PRN
  Administered 2023-04-19: 80 ug via INTRAVENOUS

## 2023-04-19 MED ORDER — PROPOFOL 500 MG/50ML IV EMUL
INTRAVENOUS | Status: DC | PRN
  Administered 2023-04-19: 100 ug/kg/min via INTRAVENOUS

## 2023-04-19 MED ORDER — PROPOFOL 10 MG/ML IV BOLUS
INTRAVENOUS | Status: DC | PRN
  Administered 2023-04-19: 60 mg via INTRAVENOUS

## 2023-04-19 MED ORDER — ONDANSETRON HCL 4 MG/2ML IJ SOLN
INTRAMUSCULAR | Status: DC | PRN
  Administered 2023-04-19: 4 mg via INTRAVENOUS

## 2023-04-19 NOTE — OR Nursing (Signed)
 At 1250, Heparin was restarted post procedure at the current ordered rate.

## 2023-04-19 NOTE — Progress Notes (Signed)
 PHARMACY - ANTICOAGULATION CONSULT NOTE  Pharmacy Consult for heparin Indication: DVT  Allergies  Allergen Reactions   Codeine Nausea Only   Gabapentin     nightmares   Lipitor [Atorvastatin]     Aches    Pravachol [Pravastatin Sodium]     aches   Tramadol     nausea   Aleve [Naproxen Sodium] Rash   Sulfa Antibiotics Itching and Rash   Sulfasalazine Itching and Rash   Patient Measurements:   Heparin Dosing Weight: 50.9 kg  Vital Signs: Temp: 99.1 F (37.3 C) (03/13 2058) Temp Source: Oral (03/13 2058) BP: 103/56 (03/13 2058) Pulse Rate: 77 (03/13 2058)  Labs: Recent Labs    04/17/23 1433 04/17/23 1535 04/17/23 1734 04/17/23 1822 04/18/23 0250 04/18/23 1112 04/18/23 1313 04/18/23 1810 04/18/23 2218 04/19/23 0413 04/19/23 0653 04/19/23 2105  HGB 11.5*  --   --   --   --  10.3*  --   --   --  8.9*  --   --   HCT 37.5  --   --   --   --  31.9*  --   --   --  26.9*  --   --   PLT 136*  --   --   --   --  123*  --   --   --  107*  --   --   LABPROT  --   --   --  16.1*  --   --   --   --   --   --   --   --   INR  --   --   --  1.3*  --   --   --   --   --   --   --   --   HEPARINUNFRC  --   --   --   --    < >  --    < >  --  0.84*  --  0.73* 0.29*  CREATININE 2.52*  --   --   --   --  2.20*   < > 2.27* 2.00* 1.90*  --   --   TROPONINIHS  --  39* 41*  --   --   --   --   --   --   --   --   --    < > = values in this interval not displayed.   Estimated Creatinine Clearance: 18.7 mL/min (A) (by C-G formula based on SCr of 1.9 mg/dL (H)).  Medical History: Past Medical History:  Diagnosis Date   Allergic rhinitis    Arthritis    joints   Cancer (HCC)    Cervical disc disease    Chronic kidney disease    STAGE 3   GERD (gastroesophageal reflux disease)    H/O blood clots    around her 20's   History of asthma    History of colon polyps    History of gout    Hyperlipidemia    Hypertension    Personal history of radiation therapy    PONV  (postoperative nausea and vomiting)    nausea   Shingles    Medications:  No PTA anticoagulation per chart review  Baseline Labs Hgb 11.5 Plt 136 INR/PT - ordered  Assessment: Jane Griffin is a 83 yo female who presented from [place] [facility] with a history of 3 days of SOB and chest pain. They also report umbilical pain with radiation upwards  to chest. Imaging of lower unilateral left leg is positive for acute occlusive femoropopliteal DVT. Pharmacy has been consulted to manage this patient heparin.   Hgb 11.5>10.3 (monitor closely due to reports of melena)  Goal of Therapy:  Heparin level 0.3-0.7 units/ml Monitor platelets by anticoagulation protocol: Yes    0312 1313 HL 0.17, subtherapeutic x 1 @ 600 u/hr 0312 2215 HL 0.84, elevated @ 750 units/hr 0313 0653 HL 0.73, supratherapeutic @ 650 units/hour 0313 2105 HL 0.29      Plan:  Heparin level is slightly subtherapeutic. Will increase heparin infusion back to 650 units/hr. Recheck heparin level in 8 hours. CBC daily while on heparin.   Thank you for involving pharmacy in this patient's care.   Paschal Dopp, PharmD Clinical Pharmacist 04/19/2023 9:29 PM

## 2023-04-19 NOTE — Progress Notes (Signed)
 PROGRESS NOTE    Jane Griffin  BJY:[id number] DOB: [birth year] DOA: 04/17/2023 PCP: Lauro Regulus, MD Outpatient Specialists: pulm, cardiology    Brief Narrative:   From admission h and p  Jane Griffin is a 83 y.o. female with medical history significant of nonobstructive CAD, pulmonary hypertension, hypertension, hyperlipidemia, mild intermittent asthma, prediabetes, CKD stage IIIb, paroxysmal SVT, who presents to the ED due to abdominal pain, dark stool, shortness of breath.   Jane Griffin states that she has been experiencing dyspnea on exertion for quite some time now but she did feel that it acutely worsened earlier today.  She notes that this worsening occurred when she was having severe abdominal pain and now her breathing seems back to normal.  She is quite worried on what has been causing her DOE, especially since she is not quite gotten a definitive answer.   Then 4 days ago, she developed epigastric abdominal pain that would occur specifically right after eating or drinking.  This has led to poor p.o. intake.  She has also developed melanotic stool with no hematochezia.  Last NSAID use was over 1 week ago and she uses very sparingly, as she usually takes Tylenol for pain.   Jane Griffin notes that earlier today, she noticed that her left leg was more swollen than her right.  Usually her legs are equally swollen at the ankles.  She endorses a prior history of DVT when she was very young after starting birth control.  No VTE since then.  Assessment & Plan:   Principal Problem:   Acute DVT (deep venous thrombosis) (HCC) Active Problems:   Melena   Shortness of breath   Acute kidney injury superimposed on chronic kidney disease (HCC)   PHT (pulmonary hypertension) (HCC)   Invasive ductal carcinoma of left breast (HCC)   Asthma   Chronic gouty arthritis   Coronary artery disease due to calcified coronary lesion  # Acute left fempop DVT # PE Breast cancer may be  provoking etiology. Tamoxifen may also be culprit. Also reports distant hx of dvt in setting of OCPs. VQ scan probable PE. TTE no right hear strain or other significant finding - continue heparin for now, if stable overnight no bleeding will plan on conversion to apixaban - dr Orlie Dakin ok with continuing tamoxifen provided patient is anticoagulated  # melena? Reports several days dark/black stool, also history of hemorrhoids and several days of small amount of brb when wiping. Last stool now 2 days ago. Hgb 11.5 on presentation stable from priors. On asa but denies nsaids. EGD performed today, nothing found to suspect bleeding. Hgb has drifted to 8.9 but patient has had plenty of IV fluids - will monitor overnight, plan on d/c tomorrow if no bleeding and hgb stable  # Abdominal pain Resolved, ct abdomen nothing acute - endoscopy as above  # aki on ckd 3b # Metabolic acidosis Cr 2.52 with metabolic acidosis, etiology unclear as reports normal po, no diarrhea or vomiting. Is on hydrochlorothiazide and lomesartan at home. Renal u/s no obstruction. Kidney function improved to 1.9 today with fluids. Acidosis resolved - d/c fluids - holding home olmesartan/hydrochlorothiazide  # Hyperkalemia Likely 2/2 aki, s/p treatment with insulin, fluids, and sodium bicarb. K normalized today but upper limit normal - will see what her k is tomorrow off fluids  # Breast cancer S/p xrt, currently on tamoxifen - hold tamoxifen (per dr. Orlie Dakin, ok to resume if patient is continued on an anticoagulant)  # CAD  On ct, negative nuclear stress last year, no chest pain - holding home asa - cont home statin, metop  # Lung nodule Probably incidental - outpt pulm f/u (is established w/ dr. Richardson Dopp)   DVT prophylaxis: heparin IV Code Status: full Family Communication: son updated @ bedside 3/13  Level of care: Telemetry Medical Status is: Observation    Consultants:  gi  Procedures: upper  endoscopy  Antimicrobials:  none    Subjective: Reports resolution of abd pain and dyspnea. Last bm two days ago. Feels well  Objective: Vitals:   04/19/23 1152 04/19/23 1242 04/19/23 1250 04/19/23 1300  BP: (!) 109/96 (!) 101/55 (!) 100/55 96/65  Pulse: 72 92 82 73  Resp: 16 15 (!) 25 17  Temp: (!) 97.5 F (36.4 C)     TempSrc: Temporal     SpO2: 99% 94% 99% 95%    Intake/Output Summary (Last 24 hours) at 04/19/2023 1408 Last data filed at 04/19/2023 1230 Gross per 24 hour  Intake 300 ml  Output 200 ml  Net 100 ml   There were no vitals filed for this visit.  Examination:  General exam: Appears calm and comfortable  Respiratory system: Clear to auscultation. Respiratory effort normal. Cardiovascular system: S1 & S2 heard, RRR. No JVD, murmurs, rubs, gallops or clicks. No pedal edema. Gastrointestinal system: Abdomen is nondistended, soft and nontender.   Central nervous system: Alert and oriented. No focal neurological deficits. Extremities: left lower extremity swelling Skin: No rashes, lesions or ulcers Psychiatry: Judgement and insight appear normal. Mood & affect appropriate.     Data Reviewed: I have personally reviewed following labs and imaging studies  CBC: Recent Labs  Lab 04/17/23 1433 04/18/23 1112 04/19/23 0413  WBC 16.6* 12.4* 9.1  HGB 11.5* 10.3* 8.9*  HCT 37.5 31.9* 26.9*  MCV 93.3 88.6 85.4  PLT 136* 123* 107*   Basic Metabolic Panel: Recent Labs  Lab 04/18/23 1112 04/18/23 1400 04/18/23 1810 04/18/23 2218 04/19/23 0413  NA 138 136 137 134* 137  K 6.4* 6.2* 4.8 5.3* 5.0  CL 110 111 110 111 105  CO2 18* 18* 16* 19* 23  GLUCOSE 91 107* 121* 83 105*  BUN 54* 52* 47* 42* 37*  CREATININE 2.20* 2.29* 2.27* 2.00* 1.90*  CALCIUM 8.9 8.6* 8.7* 8.1* 8.3*   GFR: Estimated Creatinine Clearance: 18.7 mL/min (A) (by C-G formula based on SCr of 1.9 mg/dL (H)). Liver Function Tests: Recent Labs  Lab 04/17/23 1433  AST 25  ALT 17   ALKPHOS 38  BILITOT 0.8  PROT 6.7  ALBUMIN 3.5   No results for input(s): "LIPASE", "AMYLASE" in the last 168 hours. No results for input(s): "AMMONIA" in the last 168 hours. Coagulation Profile: Recent Labs  Lab 04/17/23 1822  INR 1.3*   Cardiac Enzymes: No results for input(s): "CKTOTAL", "CKMB", "CKMBINDEX", "TROPONINI" in the last 168 hours. BNP (last 3 results) No results for input(s): "PROBNP" in the last 8760 hours. HbA1C: No results for input(s): "HGBA1C" in the last 72 hours. CBG: Recent Labs  Lab 04/18/23 1354 04/18/23 1816  GLUCAP 122* 146*   Lipid Profile: No results for input(s): "CHOL", "HDL", "LDLCALC", "TRIG", "CHOLHDL", "LDLDIRECT" in the last 72 hours. Thyroid Function Tests: No results for input(s): "TSH", "T4TOTAL", "FREET4", "T3FREE", "THYROIDAB" in the last 72 hours. Anemia Panel: No results for input(s): "VITAMINB12", "FOLATE", "FERRITIN", "TIBC", "IRON", "RETICCTPCT" in the last 72 hours. Urine analysis: No results found for: "COLORURINE", "APPEARANCEUR", "LABSPEC", "PHURINE", "GLUCOSEU", "HGBUR", "BILIRUBINUR", "KETONESUR", "PROTEINUR", "UROBILINOGEN", "  NITRITE", "LEUKOCYTESUR" Sepsis Labs: @LABRCNTIP (procalcitonin:4,lacticidven:4)  ) Recent Results (from the past 240 hours)  Resp panel by RT-PCR (RSV, Flu A&B, Covid) Anterior Nasal Swab     Status: None   Collection Time: 04/17/23  3:35 PM   Specimen: Anterior Nasal Swab  Result Value Ref Range Status   SARS Coronavirus 2 by RT PCR NEGATIVE NEGATIVE Final    Comment: (NOTE) SARS-CoV-2 target nucleic acids are NOT DETECTED.  The SARS-CoV-2 RNA is generally detectable in upper respiratory specimens during the acute phase of infection. The lowest concentration of SARS-CoV-2 viral copies this assay can detect is 138 copies/mL. A negative result does not preclude SARS-Cov-2 infection and should not be used as the sole basis for treatment or other patient management decisions. A negative  result may occur with  improper specimen collection/handling, submission of specimen other than nasopharyngeal swab, presence of viral mutation(s) within the areas targeted by this assay, and inadequate number of viral copies(<138 copies/mL). A negative result must be combined with clinical observations, patient history, and epidemiological information. The expected result is Negative.  Fact Sheet for Patients:  BloggerCourse.com  Fact Sheet for Healthcare Providers:  SeriousBroker.it  This test is no t yet approved or cleared by the [place] FDA and  has been authorized for detection and/or diagnosis of SARS-CoV-2 by FDA under an Emergency Use Authorization (EUA). This EUA will remain  in effect (meaning this test can be used) for the duration of the COVID-19 declaration under Section 564(b)(1) of the Act, 21 U.S.C.section 360bbb-3(b)(1), unless the authorization is terminated  or revoked sooner.       Influenza A by PCR NEGATIVE NEGATIVE Final   Influenza B by PCR NEGATIVE NEGATIVE Final    Comment: (NOTE) The Xpert Xpress SARS-CoV-2/FLU/RSV plus assay is intended as an aid in the diagnosis of influenza from Nasopharyngeal swab specimens and should not be used as a sole basis for treatment. Nasal washings and aspirates are unacceptable for Xpert Xpress SARS-CoV-2/FLU/RSV testing.  Fact Sheet for Patients: BloggerCourse.com  Fact Sheet for Healthcare Providers: SeriousBroker.it  This test is not yet approved or cleared by the [place] FDA and has been authorized for detection and/or diagnosis of SARS-CoV-2 by FDA under an Emergency Use Authorization (EUA). This EUA will remain in effect (meaning this test can be used) for the duration of the COVID-19 declaration under Section 564(b)(1) of the Act, 21 U.S.C. section 360bbb-3(b)(1), unless the authorization is  terminated or revoked.     Resp Syncytial Virus by PCR NEGATIVE NEGATIVE Final    Comment: (NOTE) Fact Sheet for Patients: BloggerCourse.com  Fact Sheet for Healthcare Providers: SeriousBroker.it  This test is not yet approved or cleared by the [place] FDA and has been authorized for detection and/or diagnosis of SARS-CoV-2 by FDA under an Emergency Use Authorization (EUA). This EUA will remain in effect (meaning this test can be used) for the duration of the COVID-19 declaration under Section 564(b)(1) of the Act, 21 U.S.C. section 360bbb-3(b)(1), unless the authorization is terminated or revoked.  Performed at [facility], [address] [place], [place] [address]          Radiology Studies: US RENAL Result Date: 04/19/2023 CLINICAL DATA:  [id number] AKI (acute kidney injury) (HCC) [id number] EXAM: RENAL / URINARY TRACT ULTRASOUND COMPLETE COMPARISON:  CT 04/17/2023 FINDINGS: Right Kidney: Renal measurements: 7.5 x 4.1 x 3.6 cm = volume: 57 mL. Echogenicity within normal limits. Incidentally noted 1.0 cm cyst. No solid mass or hydronephrosis visualized.  Left Kidney: Renal measurements: 8.3 x 4.2 x 4.1 cm = volume: 74 mL. Echogenicity within normal limits. Subcentimeter cyst. No solid mass or hydronephrosis visualized. Bladder: Appears normal for degree of bladder distention. Other: None. IMPRESSION: No evidence of obstructive uropathy. Electronically Signed   By: Duanne Guess D.O.   On: 04/19/2023 13:40   ECHOCARDIOGRAM COMPLETE Result Date: 04/19/2023    ECHOCARDIOGRAM REPORT   Patient Name:   JALEN OBERRY Date of Exam: 04/18/2023 Medical Rec #:  [id number]     Height:       62.0 in Accession #:    [id number]    Weight:       112.3 lb Date of Birth:  1940-11-27     BSA:          1.496 m Patient Age:    82 years      BP:           114/48 mmHg Patient Gender: F             HR:           57 bpm. Exam Location:  [facility]  Procedure: 2D Echo, Cardiac Doppler and Color Doppler (Both Spectral and Color            Flow Doppler were utilized during procedure). Indications:     I26.09 Pulmonary Embolus  History:         Patient has prior history of Echocardiogram examinations, most                  recent 12/05/2022. Risk Factors:Hypertension and Dyslipidemia.  Sonographer:     Daphine Deutscher RDCS Referring Phys:  [id number] [facility] Samin Milke Diagnosing Phys: Rozell Searing Custovic IMPRESSIONS  1. Left ventricular ejection fraction, by estimation, is 60 to 65%. The left ventricle has normal function. The left ventricle has no regional wall motion abnormalities. Left ventricular diastolic parameters were normal.  2. Right ventricular systolic function is normal. The right ventricular size is normal.  3. The mitral valve is normal in structure. Mild mitral valve regurgitation. No evidence of mitral stenosis.  4. The aortic valve is normal in structure. Aortic valve regurgitation is not visualized. No aortic stenosis is present.  5. The inferior vena cava is normal in size with greater than 50% respiratory variability, suggesting right atrial pressure of 3 mmHg. FINDINGS  Left Ventricle: Left ventricular ejection fraction, by estimation, is 60 to 65%. The left ventricle has normal function. The left ventricle has no regional wall motion abnormalities. The left ventricular internal cavity size was normal in size. There is  no left ventricular hypertrophy. Left ventricular diastolic parameters were normal. Right Ventricle: The right ventricular size is normal. No increase in right ventricular wall thickness. Right ventricular systolic function is normal. Left Atrium: Left atrial size was normal in size. Right Atrium: Right atrial size was normal in size. Pericardium: There is no evidence of pericardial effusion. Mitral Valve: The mitral valve is normal in structure. Mild mitral valve regurgitation. No evidence of mitral valve stenosis. Tricuspid  Valve: The tricuspid valve is normal in structure. Tricuspid valve regurgitation is mild. Aortic Valve: The aortic valve is normal in structure. Aortic valve regurgitation is not visualized. No aortic stenosis is present. Pulmonic Valve: The pulmonic valve was normal in structure. Pulmonic valve regurgitation is not visualized. Aorta: The aortic root is normal in size and structure. Venous: The inferior vena cava is normal in size with greater than 50% respiratory variability, suggesting right atrial pressure  of 3 mmHg. IAS/Shunts: No atrial level shunt detected by color flow Doppler.  LEFT VENTRICLE PLAX 2D LVIDd:         3.90 cm   Diastology LVIDs:         2.30 cm   LV e' medial:    6.64 cm/s LV PW:         0.80 cm   LV E/e' medial:  11.0 LV IVS:        0.90 cm   LV e' lateral:   10.20 cm/s LVOT diam:     1.90 cm   LV E/e' lateral: 7.2 LV SV:         55 LV SV Index:   37 LVOT Area:     2.84 cm  RIGHT VENTRICLE RV Basal diam:  2.60 cm RV S prime:     12.90 cm/s TAPSE (M-mode): 1.8 cm LEFT ATRIUM             Index        RIGHT ATRIUM          Index LA diam:        3.20 cm 2.14 cm/m   RA Area:     9.11 cm LA Vol (A2C):   23.0 ml 15.38 ml/m  RA Volume:   17.40 ml 11.63 ml/m LA Vol (A4C):   33.8 ml 22.60 ml/m LA Biplane Vol: 28.2 ml 18.85 ml/m  AORTIC VALVE LVOT Vmax:   97.25 cm/s LVOT Vmean:  59.550 cm/s LVOT VTI:    0.193 m  AORTA Ao Root diam: 3.30 cm MITRAL VALVE               TRICUSPID VALVE MV Area (PHT): 3.53 cm    TR Peak grad:   26.0 mmHg MV Decel Time: 215 msec    TR Vmax:        255.00 cm/s MV E velocity: 73.10 cm/s MV A velocity: 78.85 cm/s  SHUNTS MV E/A ratio:  0.93        Systemic VTI:  0.19 m                            Systemic Diam: 1.90 cm Rozell Searing Custovic Electronically signed by Clotilde Dieter Signature Date/Time: 04/19/2023/8:48:01 AM    Final    NM Pulmonary Perfusion Result Date: 04/18/2023 CLINICAL DATA:  Shortness of breath.  Positive DVT. EXAM: NUCLEAR MEDICINE PERFUSION LUNG SCAN  TECHNIQUE: Perfusion images were obtained in multiple projections after intravenous injection of radiopharmaceutical. Ventilation scans intentionally deferred if perfusion scan and chest x-ray adequate for interpretation during COVID 19 epidemic. RADIOPHARMACEUTICALS:  3.7 mCi Tc-31m MAA IV COMPARISON:  Chest x-ray 04/17/2023. FINDINGS: Focal solitary large segmental defect at the posterior left lung base. Few other small peripheral subsegmental perfusion defects. Lungs are grossly clear on x-ray. IMPRESSION: Intermediate probability perfusion lung scan based on conglomeration of findings, but with positive leg Doppler study and symptomatology with this appearance, this finding is suspicious for a pulmonary embolism. Electronically Signed   By: Karen Kays M.D.   On: 04/18/2023 12:58   CT ABDOMEN PELVIS WO CONTRAST Result Date: 04/17/2023 CLINICAL DATA:  Abdomen pain EXAM: CT ABDOMEN AND PELVIS WITHOUT CONTRAST TECHNIQUE: Multidetector CT imaging of the abdomen and pelvis was performed following the standard protocol without IV contrast. RADIATION DOSE REDUCTION: This exam was performed according to the departmental dose-optimization program which includes automated exposure control, adjustment of the mA and/or kV according  to patient size and/or use of iterative reconstruction technique. COMPARISON:  Chest x-ray 04/17/2023, CT abdomen pelvis 09/16/2015, chest CT 12/04/2022 FINDINGS: Lower chest: Lung bases demonstrate mild scarring at the lingula and left base. Negative for pleural effusion. Focal nodular airspace disease at the periphery of the right base measuring 8 mm on series 4, image 33. Coronary vascular calcification Hepatobiliary: No focal liver abnormality is seen. No gallstones, gallbladder wall thickening, or biliary dilatation. Pancreas: Unremarkable. No pancreatic ductal dilatation or surrounding inflammatory changes. Spleen: Normal in size without focal abnormality. Adrenals/Urinary Tract:  Adrenal glands are normal. Kidneys show no hydronephrosis. The bladder is unremarkable. Stomach/Bowel: Stomach nonenlarged. No dilated small bowel. No acute bowel wall thickening. Mild diverticular disease of the left colon. Nonspecific hyperdense material within the distal colon. Vascular/Lymphatic: Aortic atherosclerosis. No enlarged abdominal or pelvic lymph nodes. Reproductive: Status post hysterectomy. No adnexal masses. Other: Negative for pelvic effusion or free air. Fat containing bilateral inguinal hernias, on the right some hazy internal density is noted. No bowel containing hernia Musculoskeletal: Moderate chronic compression deformity at T11. IMPRESSION: 1. No CT evidence for acute intra-abdominal or pelvic abnormality. 2. Mild diverticular disease of the left colon without acute inflammatory process. 3. Fat containing bilateral inguinal hernias, on the right some hazy internal density is noted which may be due to edema or inflammation. No bowel containing hernia. 4. 8 mm nodular airspace disease at the periphery of the right base. Non-contrast chest CT at 6-12 months is recommended. If the nodule is stable at time of repeat CT, then future CT at 18-24 months (from today's scan) is considered optional for low-risk patients, but is recommended for high-risk patients. This recommendation follows the consensus statement: Guidelines for Management of Incidental Pulmonary Nodules Detected on CT Images: From the Fleischner Society 2017; Radiology 2017; 284:228-243. 5. Aortic atherosclerosis. Electronically Signed   By: Jasmine Pang M.D.   On: 04/17/2023 18:19   US Venous Img Lower Unilateral Left Result Date: 04/17/2023 CLINICAL DATA:  leg swellin EXAM: LEFT LOWER EXTREMITY VENOUS DOPPLER ULTRASOUND TECHNIQUE: Gray-scale sonography with compression, as well as color and duplex ultrasound, were performed to evaluate the deep venous system(s) from the level of the common femoral vein through the popliteal  and proximal calf veins. COMPARISON:  CT AP, 04/17/2023 FINDINGS: VENOUS Normal compressibility of the common femoral, and the visualized portions of profunda femoral and great saphenous vein. Heterogeneously-echogenic, near-occlusive filling defect within and incomplete compressibility of the imaged portions of the femoral and popliteal veins. The imaged calf veins appear patent. Limited views of the contralateral common femoral vein are unremarkable. OTHER No evidence of superficial thrombophlebitis or abnormal fluid collection. Limitations: none IMPRESSION: Acute, occlusive LEFT femoropopliteal DVT. Roanna Banning, MD [facility] Radiology Electronically Signed   By: Roanna Banning M.D.   On: 04/17/2023 17:25   DG Chest 2 View Result Date: 04/17/2023 CLINICAL DATA:  Shortness of breath.  Melena.  Abdominal pain EXAM: CHEST - 2 VIEW COMPARISON:  X-ray and CTA chest 12/04/2022. FINDINGS: No consolidation, pneumothorax or effusion. No edema. Normal cardiopericardial silhouette. Dense right upper lung nodule consistent with old granulomatous disease and calcified focus. Osteopenia and degenerative changes along the spine. IMPRESSION: Chronic changes.  No acute cardiopulmonary disease. Electronically Signed   By: Karen Kays M.D.   On: 04/17/2023 17:11        Scheduled Meds:  febuxostat  80 mg Oral Daily   metoprolol tartrate  25 mg Oral BID   pantoprazole (  PROTONIX) IV  40 mg Intravenous Q12H   pravastatin  40 mg Oral q1800   sodium chloride flush  3 mL Intravenous Q12H   Continuous Infusions:  sodium chloride 75 mL/hr at 04/19/23 1213   heparin 600 Units/hr (04/19/23 1250)     LOS: 0 days     Silvano Bilis, MD [facility]   If 7PM-7AM, please contact night-coverage [email or web address] Password [facility] 04/19/2023, 2:08 PM

## 2023-04-19 NOTE — Interval H&P Note (Signed)
 History and Physical Interval Note:  04/19/2023 12:17 PM  Jane Griffin  has presented today for surgery, with the diagnosis of melena, anemia.  The various methods of treatment have been discussed with the patient and family. After consideration of risks, benefits and other options for treatment, the patient has consented to  Procedure(s): EGD (ESOPHAGOGASTRODUODENOSCOPY) (N/A) as a surgical intervention.  The patient's history has been reviewed, patient examined, no change in status, stable for surgery.  I have reviewed the patient's chart and labs.  Questions were answered to the patient's satisfaction.     [place], [place]

## 2023-04-19 NOTE — Plan of Care (Signed)

## 2023-04-19 NOTE — Progress Notes (Signed)
 [facility] Gastroenterology Inpatient Progress Note    Subjective: Patient seen for f/u melena. Denies pain. NO BM in 2 days.  Objective: Vital signs in last 24 hours: Temp:  [98 F (36.7 C)-99.3 F (37.4 C)] 98.5 F (36.9 C) (03/13 0801) Pulse Rate:  [57-70] 58 (03/13 0801) Resp:  [14-18] 18 (03/13 0801) BP: (101-138)/(56-78) 128/64 (03/13 0801) SpO2:  [97 %-100 %] 99 % (03/13 0801) Blood pressure 128/64, pulse (!) 58, temperature 98.5 F (36.9 C), resp. rate 18, SpO2 99%.    Intake/Output from previous day: 03/12 0701 - 03/13 0700 In: 26.3 [I.V.:26.3] Out: -   Intake/Output this shift: No intake/output data recorded.   Gen: NAD. Appears comfortable.  HEENT: [place]/AT. PERRLA. Normal external ear exam.  Chest: CTA, no wheezes.  CV: RR nl S1, S2. No gallops.  Abd: soft, nt, nd. BS+  Ext: no edema. Pulses 2+  Neuro: Alert and oriented. Judgement appears normal. Nonfocal.   Lab Results: Results for orders placed or performed during the hospital encounter of 04/17/23 (from the past 24 hours)  Heparin level (unfractionated)     Status: Abnormal   Collection Time: 04/18/23  1:13 PM  Result Value Ref Range   Heparin Unfractionated 0.17 (L) 0.30 - 0.70 IU/mL  CBG monitoring, ED     Status: Abnormal   Collection Time: 04/18/23  1:54 PM  Result Value Ref Range   Glucose-Capillary 122 (H) 70 - 99 mg/dL  Basic metabolic panel     Status: Abnormal   Collection Time: 04/18/23  2:00 PM  Result Value Ref Range   Sodium 136 135 - 145 mmol/L   Potassium 6.2 (H) 3.5 - 5.1 mmol/L   Chloride 111 98 - 111 mmol/L   CO2 18 (L) 22 - 32 mmol/L   Glucose, Bld 107 (H) 70 - 99 mg/dL   BUN 52 (H) 8 - 23 mg/dL   Creatinine, Ser 1.61 (H) 0.44 - 1.00 mg/dL   Calcium 8.6 (L) 8.9 - 10.3 mg/dL   GFR, Estimated 21 (L) >60 mL/min   Anion gap 7 5 - 15  Basic metabolic panel     Status: Abnormal   Collection Time: 04/18/23  6:10 PM  Result Value Ref Range   Sodium 137 135 - 145 mmol/L    Potassium 4.8 3.5 - 5.1 mmol/L   Chloride 110 98 - 111 mmol/L   CO2 16 (L) 22 - 32 mmol/L   Glucose, Bld 121 (H) 70 - 99 mg/dL   BUN 47 (H) 8 - 23 mg/dL   Creatinine, Ser 0.96 (H) 0.44 - 1.00 mg/dL   Calcium 8.7 (L) 8.9 - 10.3 mg/dL   GFR, Estimated 21 (L) >60 mL/min   Anion gap 11 5 - 15  Glucose, capillary     Status: Abnormal   Collection Time: 04/18/23  6:16 PM  Result Value Ref Range   Glucose-Capillary 146 (H) 70 - 99 mg/dL  Heparin level (unfractionated)     Status: Abnormal   Collection Time: 04/18/23 10:18 PM  Result Value Ref Range   Heparin Unfractionated 0.84 (H) 0.30 - 0.70 IU/mL  Basic metabolic panel     Status: Abnormal   Collection Time: 04/18/23 10:18 PM  Result Value Ref Range   Sodium 134 (L) 135 - 145 mmol/L   Potassium 5.3 (H) 3.5 - 5.1 mmol/L   Chloride 111 98 - 111 mmol/L   CO2 19 (L) 22 - 32 mmol/L   Glucose, Bld 83 70 - 99 mg/dL  BUN 42 (H) 8 - 23 mg/dL   Creatinine, Ser 1.61 (H) 0.44 - 1.00 mg/dL   Calcium 8.1 (L) 8.9 - 10.3 mg/dL   GFR, Estimated 25 (L) >60 mL/min   Anion gap 4 (L) 5 - 15  CBC     Status: Abnormal   Collection Time: 04/19/23  4:13 AM  Result Value Ref Range   WBC 9.1 4.0 - 10.5 K/uL   RBC 3.15 (L) 3.87 - 5.11 MIL/uL   Hemoglobin 8.9 (L) 12.0 - 15.0 g/dL   HCT 09.6 (L) 04.5 - 40.9 %   MCV 85.4 80.0 - 100.0 fL   MCH 28.3 26.0 - 34.0 pg   MCHC 33.1 30.0 - 36.0 g/dL   RDW 81.1 91.4 - 78.2 %   Platelets 107 (L) 150 - 400 K/uL   nRBC 0.0 0.0 - 0.2 %  Basic metabolic panel     Status: Abnormal   Collection Time: 04/19/23  4:13 AM  Result Value Ref Range   Sodium 137 135 - 145 mmol/L   Potassium 5.0 3.5 - 5.1 mmol/L   Chloride 105 98 - 111 mmol/L   CO2 23 22 - 32 mmol/L   Glucose, Bld 105 (H) 70 - 99 mg/dL   BUN 37 (H) 8 - 23 mg/dL   Creatinine, Ser 9.56 (H) 0.44 - 1.00 mg/dL   Calcium 8.3 (L) 8.9 - 10.3 mg/dL   GFR, Estimated 26 (L) >60 mL/min   Anion gap 9 5 - 15  Heparin level (unfractionated)     Status: Abnormal    Collection Time: 04/19/23  6:53 AM  Result Value Ref Range   Heparin Unfractionated 0.73 (H) 0.30 - 0.70 IU/mL     Recent Labs    04/17/23 1433 04/18/23 1112 04/19/23 0413  WBC 16.6* 12.4* 9.1  HGB 11.5* 10.3* 8.9*  HCT 37.5 31.9* 26.9*  PLT 136* 123* 107*   BMET Recent Labs    04/18/23 1810 04/18/23 2218 04/19/23 0413  NA 137 134* 137  K 4.8 5.3* 5.0  CL 110 111 105  CO2 16* 19* 23  GLUCOSE 121* 83 105*  BUN 47* 42* 37*  CREATININE 2.27* 2.00* 1.90*  CALCIUM 8.7* 8.1* 8.3*   LFT Recent Labs    04/17/23 1433  PROT 6.7  ALBUMIN 3.5  AST 25  ALT 17  ALKPHOS 38  BILITOT 0.8   PT/INR Recent Labs    04/17/23 1822  LABPROT 16.1*  INR 1.3*   Hepatitis Panel No results for input(s): "HEPBSAG", "HCVAB", "HEPAIGM", "HEPBIGM" in the last 72 hours. C-Diff No results for input(s): "CDIFFTOX" in the last 72 hours. No results for input(s): "CDIFFPCR" in the last 72 hours.   Studies/Results: ECHOCARDIOGRAM COMPLETE Result Date: 04/19/2023    ECHOCARDIOGRAM REPORT   Patient Name:   Jane Griffin Date of Exam: 04/18/2023 Medical Rec #:  [id number]     Height:       62.0 in Accession #:    [id number]    Weight:       112.3 lb Date of Birth:  [birth year]     BSA:          1.496 m Patient Age:    82 years      BP:           114/48 mmHg Patient Gender: F             HR:           57 bpm. Exam Location:  [facility] Procedure: 2D Echo, Cardiac Doppler and Color Doppler (Both Spectral and Color            Flow Doppler were utilized during procedure). Indications:     I26.09 Pulmonary Embolus  History:         Patient has prior history of Echocardiogram examinations, most                  recent 12/05/2022. Risk Factors:Hypertension and Dyslipidemia.  Sonographer:     Daphine Deutscher RDCS Referring Phys:  [id number] [facility] Diagnosing Phys: Rozell Searing Custovic IMPRESSIONS  1. Left ventricular ejection fraction, by estimation, is 60 to 65%. The left ventricle has normal function. The  left ventricle has no regional wall motion abnormalities. Left ventricular diastolic parameters were normal.  2. Right ventricular systolic function is normal. The right ventricular size is normal.  3. The mitral valve is normal in structure. Mild mitral valve regurgitation. No evidence of mitral stenosis.  4. The aortic valve is normal in structure. Aortic valve regurgitation is not visualized. No aortic stenosis is present.  5. The inferior vena cava is normal in size with greater than 50% respiratory variability, suggesting right atrial pressure of 3 mmHg. FINDINGS  Left Ventricle: Left ventricular ejection fraction, by estimation, is 60 to 65%. The left ventricle has normal function. The left ventricle has no regional wall motion abnormalities. The left ventricular internal cavity size was normal in size. There is  no left ventricular hypertrophy. Left ventricular diastolic parameters were normal. Right Ventricle: The right ventricular size is normal. No increase in right ventricular wall thickness. Right ventricular systolic function is normal. Left Atrium: Left atrial size was normal in size. Right Atrium: Right atrial size was normal in size. Pericardium: There is no evidence of pericardial effusion. Mitral Valve: The mitral valve is normal in structure. Mild mitral valve regurgitation. No evidence of mitral valve stenosis. Tricuspid Valve: The tricuspid valve is normal in structure. Tricuspid valve regurgitation is mild. Aortic Valve: The aortic valve is normal in structure. Aortic valve regurgitation is not visualized. No aortic stenosis is present. Pulmonic Valve: The pulmonic valve was normal in structure. Pulmonic valve regurgitation is not visualized. Aorta: The aortic root is normal in size and structure. Venous: The inferior vena cava is normal in size with greater than 50% respiratory variability, suggesting right atrial pressure of 3 mmHg. IAS/Shunts: No atrial level shunt detected by color flow  Doppler.  LEFT VENTRICLE PLAX 2D LVIDd:         3.90 cm   Diastology LVIDs:         2.30 cm   LV e' medial:    6.64 cm/s LV PW:         0.80 cm   LV E/e' medial:  11.0 LV IVS:        0.90 cm   LV e' lateral:   10.20 cm/s LVOT diam:     1.90 cm   LV E/e' lateral: 7.2 LV SV:         55 LV SV Index:   37 LVOT Area:     2.84 cm  RIGHT VENTRICLE RV Basal diam:  2.60 cm RV S prime:     12.90 cm/s TAPSE (M-mode): 1.8 cm LEFT ATRIUM             Index        RIGHT ATRIUM          Index LA diam:  3.20 cm 2.14 cm/m   RA Area:     9.11 cm LA Vol (A2C):   23.0 ml 15.38 ml/m  RA Volume:   17.40 ml 11.63 ml/m LA Vol (A4C):   33.8 ml 22.60 ml/m LA Biplane Vol: 28.2 ml 18.85 ml/m  AORTIC VALVE LVOT Vmax:   97.25 cm/s LVOT Vmean:  59.550 cm/s LVOT VTI:    0.193 m  AORTA Ao Root diam: 3.30 cm MITRAL VALVE               TRICUSPID VALVE MV Area (PHT): 3.53 cm    TR Peak grad:   26.0 mmHg MV Decel Time: 215 msec    TR Vmax:        255.00 cm/s MV E velocity: 73.10 cm/s MV A velocity: 78.85 cm/s  SHUNTS MV E/A ratio:  0.93        Systemic VTI:  0.19 m                            Systemic Diam: 1.90 cm Rozell Searing Custovic Electronically signed by Clotilde Dieter Signature Date/Time: 04/19/2023/8:48:01 AM    Final    NM Pulmonary Perfusion Result Date: 04/18/2023 CLINICAL DATA:  Shortness of breath.  Positive DVT. EXAM: NUCLEAR MEDICINE PERFUSION LUNG SCAN TECHNIQUE: Perfusion images were obtained in multiple projections after intravenous injection of radiopharmaceutical. Ventilation scans intentionally deferred if perfusion scan and chest x-ray adequate for interpretation during COVID 19 epidemic. RADIOPHARMACEUTICALS:  3.7 mCi Tc-34m MAA IV COMPARISON:  Chest x-ray 04/17/2023. FINDINGS: Focal solitary large segmental defect at the posterior left lung base. Few other small peripheral subsegmental perfusion defects. Lungs are grossly clear on x-ray. IMPRESSION: Intermediate probability perfusion lung scan based on conglomeration  of findings, but with positive leg Doppler study and symptomatology with this appearance, this finding is suspicious for a pulmonary embolism. Electronically Signed   By: Karen Kays M.D.   On: 04/18/2023 12:58   CT ABDOMEN PELVIS WO CONTRAST Result Date: 04/17/2023 CLINICAL DATA:  Abdomen pain EXAM: CT ABDOMEN AND PELVIS WITHOUT CONTRAST TECHNIQUE: Multidetector CT imaging of the abdomen and pelvis was performed following the standard protocol without IV contrast. RADIATION DOSE REDUCTION: This exam was performed according to the departmental dose-optimization program which includes automated exposure control, adjustment of the mA and/or kV according to patient size and/or use of iterative reconstruction technique. COMPARISON:  Chest x-ray 04/17/2023, CT abdomen pelvis 09/16/2015, chest CT 12/04/2022 FINDINGS: Lower chest: Lung bases demonstrate mild scarring at the lingula and left base. Negative for pleural effusion. Focal nodular airspace disease at the periphery of the right base measuring 8 mm on series 4, image 33. Coronary vascular calcification Hepatobiliary: No focal liver abnormality is seen. No gallstones, gallbladder wall thickening, or biliary dilatation. Pancreas: Unremarkable. No pancreatic ductal dilatation or surrounding inflammatory changes. Spleen: Normal in size without focal abnormality. Adrenals/Urinary Tract: Adrenal glands are normal. Kidneys show no hydronephrosis. The bladder is unremarkable. Stomach/Bowel: Stomach nonenlarged. No dilated small bowel. No acute bowel wall thickening. Mild diverticular disease of the left colon. Nonspecific hyperdense material within the distal colon. Vascular/Lymphatic: Aortic atherosclerosis. No enlarged abdominal or pelvic lymph nodes. Reproductive: Status post hysterectomy. No adnexal masses. Other: Negative for pelvic effusion or free air. Fat containing bilateral inguinal hernias, on the right some hazy internal density is noted. No bowel  containing hernia Musculoskeletal: Moderate chronic compression deformity at T11. IMPRESSION: 1. No CT evidence for acute intra-abdominal or pelvic abnormality. 2.  Mild diverticular disease of the left colon without acute inflammatory process. 3. Fat containing bilateral inguinal hernias, on the right some hazy internal density is noted which may be due to edema or inflammation. No bowel containing hernia. 4. 8 mm nodular airspace disease at the periphery of the right base. Non-contrast chest CT at 6-12 months is recommended. If the nodule is stable at time of repeat CT, then future CT at 18-24 months (from today's scan) is considered optional for low-risk patients, but is recommended for high-risk patients. This recommendation follows the consensus statement: Guidelines for Management of Incidental Pulmonary Nodules Detected on CT Images: From the Fleischner Society 2017; Radiology 2017; 284:228-243. 5. Aortic atherosclerosis. Electronically Signed   By: Jasmine Pang M.D.   On: 04/17/2023 18:19   US Venous Img Lower Unilateral Left Result Date: 04/17/2023 CLINICAL DATA:  leg swellin EXAM: LEFT LOWER EXTREMITY VENOUS DOPPLER ULTRASOUND TECHNIQUE: Gray-scale sonography with compression, as well as color and duplex ultrasound, were performed to evaluate the deep venous system(s) from the level of the common femoral vein through the popliteal and proximal calf veins. COMPARISON:  CT AP, 04/17/2023 FINDINGS: VENOUS Normal compressibility of the common femoral, and the visualized portions of profunda femoral and great saphenous vein. Heterogeneously-echogenic, near-occlusive filling defect within and incomplete compressibility of the imaged portions of the femoral and popliteal veins. The imaged calf veins appear patent. Limited views of the contralateral common femoral vein are unremarkable. OTHER No evidence of superficial thrombophlebitis or abnormal fluid collection. Limitations: none IMPRESSION: Acute, occlusive  LEFT femoropopliteal DVT. Roanna Banning, MD [facility] Radiology Electronically Signed   By: Roanna Banning M.D.   On: 04/17/2023 17:25   DG Chest 2 View Result Date: 04/17/2023 CLINICAL DATA:  Shortness of breath.  Melena.  Abdominal pain EXAM: CHEST - 2 VIEW COMPARISON:  X-ray and CTA chest 12/04/2022. FINDINGS: No consolidation, pneumothorax or effusion. No edema. Normal cardiopericardial silhouette. Dense right upper lung nodule consistent with old granulomatous disease and calcified focus. Osteopenia and degenerative changes along the spine. IMPRESSION: Chronic changes.  No acute cardiopulmonary disease. Electronically Signed   By: Karen Kays M.D.   On: 04/17/2023 17:11    Scheduled Inpatient Medications:    febuxostat  80 mg Oral Daily   metoprolol tartrate  25 mg Oral BID   pantoprazole (PROTONIX) IV  40 mg Intravenous Q12H   pravastatin  40 mg Oral q1800   sodium chloride flush  3 mL Intravenous Q12H    Continuous Inpatient Infusions:    sodium chloride 75 mL/hr at 04/19/23 0939   heparin Stopped (04/19/23 0812)    PRN Inpatient Medications:  albuterol, HYDROmorphone (DILAUDID) injection, ondansetron **OR** ondansetron (ZOFRAN) IV  Assessment: Epigastric pain. Melena, possible UGI bleed. Hx Breast Cancer. Acute DVT. Probable Left lung base Pulmonary embolism.   Plan:   Definitive evaluation and care for DVT/PTE. Serial H/H. Hold heparin x 4 hrs. Done. NPO.  EGD today. The patient understands the nature of the planned procedure, indications, risks, alternatives and potential complications including but not limited to bleeding, infection, perforation, damage to internal organs and possible oversedation/side effects from anesthesia. The patient agrees and gives consent to proceed.  Please refer to procedure notes for findings, recommendations and patient disposition/instructions.    Jane Griffin K. Norma Fredrickson, M.D. 04/19/2023, 11:16  AM

## 2023-04-19 NOTE — Transfer of Care (Signed)
 Immediate Anesthesia Transfer of Care Note  Patient: Jane Griffin  Procedure(s) Performed: EGD (ESOPHAGOGASTRODUODENOSCOPY)  Patient Location: PACU and Endoscopy Unit  Anesthesia Type:General  Level of Consciousness: awake and alert   Airway & Oxygen Therapy: Patient Spontanous Breathing  Post-op Assessment: Report given to RN and Post -op Vital signs reviewed and stable  Post vital signs: Reviewed and stable  Last Vitals:  Vitals Value Taken Time  BP 101/55 04/19/23 1242  Temp    Pulse 90 04/19/23 1244  Resp 15 04/19/23 1244  SpO2 99 % 04/19/23 1244  Vitals shown include unfiled device data.  Last Pain:  Vitals:   04/19/23 1152  TempSrc: Temporal  PainSc:       Patients Stated Pain Goal: 4 (04/17/23 2136)  Complications: No notable events documented.

## 2023-04-19 NOTE — Op Note (Addendum)
 [facility] Gastroenterology Patient Name: Jane Griffin Procedure Date: 04/19/2023 12:17 PM MRN: [id number] Account #: [id number] Date of Birth: 1940/12/11 Admit Type: Inpatient Age: 83 Room: [facility] ENDO ROOM 4 Gender: Female Note Status: Supervisor Override Instrument Name: Patton Salles Endoscope [id number] Procedure:             Upper GI endoscopy Indications:           Melena Providers:             Boykin Nearing. Norma Fredrickson MD, MD Referring MD:          Marya Amsler. Dareen Piano MD, MD (Referring MD) Medicines:             Propofol per Anesthesia Complications:         No immediate complications. Estimated blood loss: None. Procedure:             Pre-Anesthesia Assessment:                        - The risks and benefits of the procedure and the                         sedation options and risks were discussed with the                         patient. All questions were answered and informed                         consent was obtained.                        - Patient identification and proposed procedure were                         verified prior to the procedure by the nurse. The                         procedure was verified in the procedure room.                        - ASA Grade Assessment: III - A patient with severe                         systemic disease.                        - After reviewing the risks and benefits, the patient                         was deemed in satisfactory condition to undergo the                         procedure.                        After obtaining informed consent, the endoscope was                         passed under direct vision. Throughout the procedure,  the patient's blood pressure, pulse, and oxygen                         saturations were monitored continuously. The Endoscope                         was introduced through the mouth, and advanced to the                         third part of duodenum. The upper GI  endoscopy was                         accomplished without difficulty. The patient tolerated                         the procedure well. Findings:      The esophagus was normal.      A few medium pedunculated and sessile polyps with no bleeding and no       stigmata of recent bleeding were found in the gastric body. Biopsy is       contraindicated because the patient has a bleeding disorder.      A 2 cm hiatal hernia was present.      The examined duodenum was normal. Impression:            - Normal esophagus.                        - A few gastric polyps. Biopsy is contraindicated.                        - 2 cm hiatal hernia.                        - Normal examined duodenum.                        - No specimens collected. Recommendation:        - Return patient to hospital ward for ongoing care.                        - Resume heparin at prior dose NOW. Refer to managing                         physician for further adjustment of therapy.                        - [employer] will sign off for now. Call us back if any                         changes clinically or if we can help for any reason. Procedure Code(s):     --- Professional ---                        [phone], Esophagogastroduodenoscopy, flexible,                         transoral; diagnostic, including collection of  specimen(s) by brushing or washing, when performed                         (separate procedure) Diagnosis Code(s):     --- Professional ---                        K92.1, Melena (includes Hematochezia)                        K44.9, Diaphragmatic hernia without obstruction or                         gangrene                        K31.7, Polyp of stomach and duodenum CPT copyright 2022 American [employer] Association. All rights reserved. The codes documented in this report are preliminary and upon coder review may  be revised to meet current compliance requirements. Stanton Kidney MD, MD 04/19/2023  12:32:24 PM This report has been signed electronically. Number of Addenda: 0 Note Initiated On: 04/19/2023 12:17 PM Estimated Blood Loss:  Estimated blood loss: none.      [facility]

## 2023-04-19 NOTE — Anesthesia Preprocedure Evaluation (Signed)
 Anesthesia Evaluation  Patient identified by MRN, date of birth, ID band Patient awake    Reviewed: Allergy & Precautions, NPO status , Patient's Chart, lab work & pertinent test results  History of Anesthesia Complications (+) PONV and history of anesthetic complications  Airway Mallampati: II  TM Distance: >3 FB Neck ROM: full    Dental  (+) Teeth Intact, Dental Advidsory Given   Pulmonary shortness of breath (2/2 anemia), asthma , neg sleep apnea, neg COPD, neg recent URI   Pulmonary exam normal breath sounds clear to auscultation       Cardiovascular Exercise Tolerance: Good hypertension, Pt. on medications (-) angina + CAD and + Past MI  (-) Cardiac Stents Normal cardiovascular exam(-) dysrhythmias (-) Valvular Problems/Murmurs Rhythm:Regular     Neuro/Psych negative neurological ROS  negative psych ROS   GI/Hepatic negative GI ROS, Neg liver ROS,GERD  Medicated,,  Endo/Other  negative endocrine ROS    Renal/GU CRFRenal diseasenegative Renal ROS  negative genitourinary   Musculoskeletal   Abdominal Normal abdominal exam  (+)   Peds negative pediatric ROS (+)  Hematology  (+) Blood dyscrasia, anemia   Anesthesia Other Findings Past Medical History: No date: Allergic rhinitis No date: Arthritis No date: Cancer (HCC) No date: Cervical disc disease No date: Chronic kidney disease     Comment:  STAGE 3 No date: GERD (gastroesophageal reflux disease) No date: H/O blood clots     Comment:  around her 40's No date: History of colon polyps No date: Hyperlipidemia No date: Hypertension No date: PONV (postoperative nausea and vomiting)     Comment:  nausea No date: Shingles  Past Surgical History: No date: ABDOMINAL HYSTERECTOMY 10/26/2021: BREAST BIOPSY; Left     Comment:  [place] Core Bx ribbon clip path pending 2013: COLONOSCOPY 10/11/2016: COLONOSCOPY WITH PROPOFOL; N/A     Comment:  Procedure: COLONOSCOPY  WITH PROPOFOL;  Surgeon: Kieth Brightly, MD;  Location: [facility] ENDOSCOPY;  Service:               Endoscopy;  Laterality: N/A; No date: ELBOW SURGERY; Left No date: EYE SURGERY     Comment:  both eyes cataract removed 09/18/2015: FEMORAL HERNIA REPAIR; Right     Comment:  Procedure: HERNIA REPAIR femoral  ADULT;  Surgeon:               Kieth Brightly, MD;  Location: [facility] ORS;  Service:              General;  Laterality: Right; No date: HERNIA REPAIR 09/06/2015: LAPAROSCOPIC BILATERAL SALPINGO OOPHERECTOMY; Bilateral     Comment:  Procedure: LAPAROSCOPIC BILATERAL SALPINGO OOPHORECTOMY;              Surgeon: Suzy Bouchard, MD;  Location: [facility] ORS;               Service: Gynecology;  Laterality: Bilateral; No date: PARTIAL HYSTERECTOMY No date: TUBAL LIGATION No date: UPPER GI ENDOSCOPY No date: WRIST SURGERY; Right  BMI    Body Mass Index: 21.40 kg/m      Reproductive/Obstetrics negative OB ROS                             Anesthesia Physical Anesthesia Plan  ASA: 3  Anesthesia Plan: General   Post-op Pain Management:    Induction: Intravenous  PONV Risk Score and Plan:  4 or greater and Propofol infusion and TIVA  Airway Management Planned: Natural Airway and Nasal Cannula  Additional Equipment:   Intra-op Plan:   Post-operative Plan:   Informed Consent: I have reviewed the patients History and Physical, chart, labs and discussed the procedure including the risks, benefits and alternatives for the proposed anesthesia with the patient or authorized representative who has indicated his/her understanding and acceptance.     Dental Advisory Given  Plan Discussed with: CRNA and Surgeon  Anesthesia Plan Comments:         Anesthesia Quick Evaluation

## 2023-04-19 NOTE — Care Plan (Signed)
 Patient s/p EGD Patient tolerated procedure without difficulty  Patient Alert and Oriented Patient with increased secretions - sx in use   Report given to RN on unit

## 2023-04-19 NOTE — Anesthesia Postprocedure Evaluation (Signed)
 Anesthesia Post Note  Patient: Jane Griffin  Procedure(s) Performed: EGD (ESOPHAGOGASTRODUODENOSCOPY)  Patient location during evaluation: Endoscopy Anesthesia Type: General Level of consciousness: awake and alert Pain management: pain level controlled Vital Signs Assessment: post-procedure vital signs reviewed and stable Respiratory status: spontaneous breathing, nonlabored ventilation, respiratory function stable and patient connected to nasal cannula oxygen Cardiovascular status: blood pressure returned to baseline and stable Postop Assessment: no apparent nausea or vomiting Anesthetic complications: no   No notable events documented.   Last Vitals:  Vitals:   04/19/23 1250 04/19/23 1300  BP: (!) 100/55 96/65  Pulse: 82 73  Resp: (!) 25 17  Temp:    SpO2: 99% 95%    Last Pain:  Vitals:   04/19/23 1152  TempSrc: Temporal  PainSc:                  Lenard Simmer

## 2023-04-19 NOTE — Progress Notes (Signed)
 PHARMACY - ANTICOAGULATION CONSULT NOTE  Pharmacy Consult for heparin Indication: DVT  Allergies  Allergen Reactions   Codeine Nausea Only   Gabapentin     nightmares   Lipitor [Atorvastatin]     Aches    Pravachol [Pravastatin Sodium]     aches   Tramadol     nausea   Aleve [Naproxen Sodium] Rash   Sulfa Antibiotics Itching and Rash   Sulfasalazine Itching and Rash   Patient Measurements:   Heparin Dosing Weight: 50.9 kg  Vital Signs: Temp: 98.4 F (36.9 C) (03/13 0617) Temp Source: Oral (03/13 0617) BP: 138/68 (03/13 0617) Pulse Rate: 61 (03/13 0617)  Labs: Recent Labs    04/17/23 1433 04/17/23 1535 04/17/23 1734 04/17/23 1822 04/18/23 0250 04/18/23 1112 04/18/23 1313 04/18/23 1400 04/18/23 1810 04/18/23 2218 04/19/23 0413 04/19/23 0653  HGB 11.5*  --   --   --   --  10.3*  --   --   --   --  8.9*  --   HCT 37.5  --   --   --   --  31.9*  --   --   --   --  26.9*  --   PLT 136*  --   --   --   --  123*  --   --   --   --  107*  --   LABPROT  --   --   --  16.1*  --   --   --   --   --   --   --   --   INR  --   --   --  1.3*  --   --   --   --   --   --   --   --   HEPARINUNFRC  --   --   --   --    < >  --  0.17*  --   --  0.84*  --  0.73*  CREATININE 2.52*  --   --   --   --  2.20*  --    < > 2.27* 2.00* 1.90*  --   TROPONINIHS  --  39* 41*  --   --   --   --   --   --   --   --   --    < > = values in this interval not displayed.   Estimated Creatinine Clearance: 18.7 mL/min (A) (by C-G formula based on SCr of 1.9 mg/dL (H)).  Medical History: Past Medical History:  Diagnosis Date   Allergic rhinitis    Arthritis    joints   Cancer (HCC)    Cervical disc disease    Chronic kidney disease    STAGE 3   GERD (gastroesophageal reflux disease)    H/O blood clots    around her 20's   History of asthma    History of colon polyps    History of gout    Hyperlipidemia    Hypertension    Personal history of radiation therapy    PONV  (postoperative nausea and vomiting)    nausea   Shingles    Medications:  No PTA anticoagulation per chart review  Baseline Labs Hgb 11.5 Plt 136 INR/PT - ordered  Assessment: LR is a 83 yo female who presented from [place] [facility] with a history of 3 days of SOB and chest pain. They also report umbilical pain with radiation upwards to  chest. Imaging of lower unilateral left leg is positive for acute occlusive femoropopliteal DVT. Pharmacy has been consulted to manage this patient heparin.   Hgb 11.5>10.3 (monitor closely due to reports of melena)  Goal of Therapy:  Heparin level 0.3-0.7 units/ml Monitor platelets by anticoagulation protocol: Yes    0312 1313 HL 0.17, subtherapeutic x 1 @ 600 u/hr 0312 2215 HL 0.84, elevated @ 750 units/hr 0313 0653 HL 0.73, supratherapeutic @ 650 units/hour  No issues with infusion reported, no signs/symptoms of bleeding noted. Hgb 8.9, plt 107.   Plan:  Decrease heparin rate to 600 units/hour Check heparin level in 8 hours Monitor daily heparin levels while on heparin infusion Monitor daily CBC and signs/symptoms of bleeding.  Thank you for involving pharmacy in this patient's care.   Rockwell Alexandria, PharmD Clinical Pharmacist 04/19/2023 7:22 AM

## 2023-04-19 NOTE — H&P (View-Only) (Signed)
 [facility] Gastroenterology Inpatient Progress Note    Subjective: Patient seen for f/u melena. Denies pain. NO BM in 2 days.  Objective: Vital signs in last 24 hours: Temp:  [98 F (36.7 C)-99.3 F (37.4 C)] 98.5 F (36.9 C) (03/13 0801) Pulse Rate:  [57-70] 58 (03/13 0801) Resp:  [14-18] 18 (03/13 0801) BP: (101-138)/(56-78) 128/64 (03/13 0801) SpO2:  [97 %-100 %] 99 % (03/13 0801) Blood pressure 128/64, pulse (!) 58, temperature 98.5 F (36.9 C), resp. rate 18, SpO2 99%.    Intake/Output from previous day: 03/12 0701 - 03/13 0700 In: 26.3 [I.V.:26.3] Out: -   Intake/Output this shift: No intake/output data recorded.   Gen: NAD. Appears comfortable.  HEENT: [place]/AT. PERRLA. Normal external ear exam.  Chest: CTA, no wheezes.  CV: RR nl S1, S2. No gallops.  Abd: soft, nt, nd. BS+  Ext: no edema. Pulses 2+  Neuro: Alert and oriented. Judgement appears normal. Nonfocal.   Lab Results: Results for orders placed or performed during the hospital encounter of 04/17/23 (from the past 24 hours)  Heparin level (unfractionated)     Status: Abnormal   Collection Time: 04/18/23  1:13 PM  Result Value Ref Range   Heparin Unfractionated 0.17 (L) 0.30 - 0.70 IU/mL  CBG monitoring, ED     Status: Abnormal   Collection Time: 04/18/23  1:54 PM  Result Value Ref Range   Glucose-Capillary 122 (H) 70 - 99 mg/dL  Basic metabolic panel     Status: Abnormal   Collection Time: 04/18/23  2:00 PM  Result Value Ref Range   Sodium 136 135 - 145 mmol/L   Potassium 6.2 (H) 3.5 - 5.1 mmol/L   Chloride 111 98 - 111 mmol/L   CO2 18 (L) 22 - 32 mmol/L   Glucose, Bld 107 (H) 70 - 99 mg/dL   BUN 52 (H) 8 - 23 mg/dL   Creatinine, Ser 1.61 (H) 0.44 - 1.00 mg/dL   Calcium 8.6 (L) 8.9 - 10.3 mg/dL   GFR, Estimated 21 (L) >60 mL/min   Anion gap 7 5 - 15  Basic metabolic panel     Status: Abnormal   Collection Time: 04/18/23  6:10 PM  Result Value Ref Range   Sodium 137 135 - 145 mmol/L    Potassium 4.8 3.5 - 5.1 mmol/L   Chloride 110 98 - 111 mmol/L   CO2 16 (L) 22 - 32 mmol/L   Glucose, Bld 121 (H) 70 - 99 mg/dL   BUN 47 (H) 8 - 23 mg/dL   Creatinine, Ser 0.96 (H) 0.44 - 1.00 mg/dL   Calcium 8.7 (L) 8.9 - 10.3 mg/dL   GFR, Estimated 21 (L) >60 mL/min   Anion gap 11 5 - 15  Glucose, capillary     Status: Abnormal   Collection Time: 04/18/23  6:16 PM  Result Value Ref Range   Glucose-Capillary 146 (H) 70 - 99 mg/dL  Heparin level (unfractionated)     Status: Abnormal   Collection Time: 04/18/23 10:18 PM  Result Value Ref Range   Heparin Unfractionated 0.84 (H) 0.30 - 0.70 IU/mL  Basic metabolic panel     Status: Abnormal   Collection Time: 04/18/23 10:18 PM  Result Value Ref Range   Sodium 134 (L) 135 - 145 mmol/L   Potassium 5.3 (H) 3.5 - 5.1 mmol/L   Chloride 111 98 - 111 mmol/L   CO2 19 (L) 22 - 32 mmol/L   Glucose, Bld 83 70 - 99 mg/dL  BUN 42 (H) 8 - 23 mg/dL   Creatinine, Ser 1.61 (H) 0.44 - 1.00 mg/dL   Calcium 8.1 (L) 8.9 - 10.3 mg/dL   GFR, Estimated 25 (L) >60 mL/min   Anion gap 4 (L) 5 - 15  CBC     Status: Abnormal   Collection Time: 04/19/23  4:13 AM  Result Value Ref Range   WBC 9.1 4.0 - 10.5 K/uL   RBC 3.15 (L) 3.87 - 5.11 MIL/uL   Hemoglobin 8.9 (L) 12.0 - 15.0 g/dL   HCT 09.6 (L) 04.5 - 40.9 %   MCV 85.4 80.0 - 100.0 fL   MCH 28.3 26.0 - 34.0 pg   MCHC 33.1 30.0 - 36.0 g/dL   RDW 81.1 91.4 - 78.2 %   Platelets 107 (L) 150 - 400 K/uL   nRBC 0.0 0.0 - 0.2 %  Basic metabolic panel     Status: Abnormal   Collection Time: 04/19/23  4:13 AM  Result Value Ref Range   Sodium 137 135 - 145 mmol/L   Potassium 5.0 3.5 - 5.1 mmol/L   Chloride 105 98 - 111 mmol/L   CO2 23 22 - 32 mmol/L   Glucose, Bld 105 (H) 70 - 99 mg/dL   BUN 37 (H) 8 - 23 mg/dL   Creatinine, Ser 9.56 (H) 0.44 - 1.00 mg/dL   Calcium 8.3 (L) 8.9 - 10.3 mg/dL   GFR, Estimated 26 (L) >60 mL/min   Anion gap 9 5 - 15  Heparin level (unfractionated)     Status: Abnormal    Collection Time: 04/19/23  6:53 AM  Result Value Ref Range   Heparin Unfractionated 0.73 (H) 0.30 - 0.70 IU/mL     Recent Labs    04/17/23 1433 04/18/23 1112 04/19/23 0413  WBC 16.6* 12.4* 9.1  HGB 11.5* 10.3* 8.9*  HCT 37.5 31.9* 26.9*  PLT 136* 123* 107*   BMET Recent Labs    04/18/23 1810 04/18/23 2218 04/19/23 0413  NA 137 134* 137  K 4.8 5.3* 5.0  CL 110 111 105  CO2 16* 19* 23  GLUCOSE 121* 83 105*  BUN 47* 42* 37*  CREATININE 2.27* 2.00* 1.90*  CALCIUM 8.7* 8.1* 8.3*   LFT Recent Labs    04/17/23 1433  PROT 6.7  ALBUMIN 3.5  AST 25  ALT 17  ALKPHOS 38  BILITOT 0.8   PT/INR Recent Labs    04/17/23 1822  LABPROT 16.1*  INR 1.3*   Hepatitis Panel No results for input(s): "HEPBSAG", "HCVAB", "HEPAIGM", "HEPBIGM" in the last 72 hours. C-Diff No results for input(s): "CDIFFTOX" in the last 72 hours. No results for input(s): "CDIFFPCR" in the last 72 hours.   Studies/Results: ECHOCARDIOGRAM COMPLETE Result Date: 04/19/2023    ECHOCARDIOGRAM REPORT   Patient Name:   Jane Griffin Date of Exam: 04/18/2023 Medical Rec #:  [id number]     Height:       62.0 in Accession #:    [id number]    Weight:       112.3 lb Date of Birth:  [birth year]     BSA:          1.496 m Patient Age:    82 years      BP:           114/48 mmHg Patient Gender: F             HR:           57 bpm. Exam Location:  [facility] Procedure: 2D Echo, Cardiac Doppler and Color Doppler (Both Spectral and Color            Flow Doppler were utilized during procedure). Indications:     I26.09 Pulmonary Embolus  History:         Patient has prior history of Echocardiogram examinations, most                  recent 12/05/2022. Risk Factors:Hypertension and Dyslipidemia.  Sonographer:     Daphine Deutscher RDCS Referring Phys:  [id number] [facility] Diagnosing Phys: Rozell Searing Custovic IMPRESSIONS  1. Left ventricular ejection fraction, by estimation, is 60 to 65%. The left ventricle has normal function. The  left ventricle has no regional wall motion abnormalities. Left ventricular diastolic parameters were normal.  2. Right ventricular systolic function is normal. The right ventricular size is normal.  3. The mitral valve is normal in structure. Mild mitral valve regurgitation. No evidence of mitral stenosis.  4. The aortic valve is normal in structure. Aortic valve regurgitation is not visualized. No aortic stenosis is present.  5. The inferior vena cava is normal in size with greater than 50% respiratory variability, suggesting right atrial pressure of 3 mmHg. FINDINGS  Left Ventricle: Left ventricular ejection fraction, by estimation, is 60 to 65%. The left ventricle has normal function. The left ventricle has no regional wall motion abnormalities. The left ventricular internal cavity size was normal in size. There is  no left ventricular hypertrophy. Left ventricular diastolic parameters were normal. Right Ventricle: The right ventricular size is normal. No increase in right ventricular wall thickness. Right ventricular systolic function is normal. Left Atrium: Left atrial size was normal in size. Right Atrium: Right atrial size was normal in size. Pericardium: There is no evidence of pericardial effusion. Mitral Valve: The mitral valve is normal in structure. Mild mitral valve regurgitation. No evidence of mitral valve stenosis. Tricuspid Valve: The tricuspid valve is normal in structure. Tricuspid valve regurgitation is mild. Aortic Valve: The aortic valve is normal in structure. Aortic valve regurgitation is not visualized. No aortic stenosis is present. Pulmonic Valve: The pulmonic valve was normal in structure. Pulmonic valve regurgitation is not visualized. Aorta: The aortic root is normal in size and structure. Venous: The inferior vena cava is normal in size with greater than 50% respiratory variability, suggesting right atrial pressure of 3 mmHg. IAS/Shunts: No atrial level shunt detected by color flow  Doppler.  LEFT VENTRICLE PLAX 2D LVIDd:         3.90 cm   Diastology LVIDs:         2.30 cm   LV e' medial:    6.64 cm/s LV PW:         0.80 cm   LV E/e' medial:  11.0 LV IVS:        0.90 cm   LV e' lateral:   10.20 cm/s LVOT diam:     1.90 cm   LV E/e' lateral: 7.2 LV SV:         55 LV SV Index:   37 LVOT Area:     2.84 cm  RIGHT VENTRICLE RV Basal diam:  2.60 cm RV S prime:     12.90 cm/s TAPSE (M-mode): 1.8 cm LEFT ATRIUM             Index        RIGHT ATRIUM          Index LA diam:  3.20 cm 2.14 cm/m   RA Area:     9.11 cm LA Vol (A2C):   23.0 ml 15.38 ml/m  RA Volume:   17.40 ml 11.63 ml/m LA Vol (A4C):   33.8 ml 22.60 ml/m LA Biplane Vol: 28.2 ml 18.85 ml/m  AORTIC VALVE LVOT Vmax:   97.25 cm/s LVOT Vmean:  59.550 cm/s LVOT VTI:    0.193 m  AORTA Ao Root diam: 3.30 cm MITRAL VALVE               TRICUSPID VALVE MV Area (PHT): 3.53 cm    TR Peak grad:   26.0 mmHg MV Decel Time: 215 msec    TR Vmax:        255.00 cm/s MV E velocity: 73.10 cm/s MV A velocity: 78.85 cm/s  SHUNTS MV E/A ratio:  0.93        Systemic VTI:  0.19 m                            Systemic Diam: 1.90 cm Rozell Searing Custovic Electronically signed by Clotilde Dieter Signature Date/Time: 04/19/2023/8:48:01 AM    Final    NM Pulmonary Perfusion Result Date: 04/18/2023 CLINICAL DATA:  Shortness of breath.  Positive DVT. EXAM: NUCLEAR MEDICINE PERFUSION LUNG SCAN TECHNIQUE: Perfusion images were obtained in multiple projections after intravenous injection of radiopharmaceutical. Ventilation scans intentionally deferred if perfusion scan and chest x-ray adequate for interpretation during COVID 19 epidemic. RADIOPHARMACEUTICALS:  3.7 mCi Tc-34m MAA IV COMPARISON:  Chest x-ray 04/17/2023. FINDINGS: Focal solitary large segmental defect at the posterior left lung base. Few other small peripheral subsegmental perfusion defects. Lungs are grossly clear on x-ray. IMPRESSION: Intermediate probability perfusion lung scan based on conglomeration  of findings, but with positive leg Doppler study and symptomatology with this appearance, this finding is suspicious for a pulmonary embolism. Electronically Signed   By: Karen Kays M.D.   On: 04/18/2023 12:58   CT ABDOMEN PELVIS WO CONTRAST Result Date: 04/17/2023 CLINICAL DATA:  Abdomen pain EXAM: CT ABDOMEN AND PELVIS WITHOUT CONTRAST TECHNIQUE: Multidetector CT imaging of the abdomen and pelvis was performed following the standard protocol without IV contrast. RADIATION DOSE REDUCTION: This exam was performed according to the departmental dose-optimization program which includes automated exposure control, adjustment of the mA and/or kV according to patient size and/or use of iterative reconstruction technique. COMPARISON:  Chest x-ray 04/17/2023, CT abdomen pelvis 09/16/2015, chest CT 12/04/2022 FINDINGS: Lower chest: Lung bases demonstrate mild scarring at the lingula and left base. Negative for pleural effusion. Focal nodular airspace disease at the periphery of the right base measuring 8 mm on series 4, image 33. Coronary vascular calcification Hepatobiliary: No focal liver abnormality is seen. No gallstones, gallbladder wall thickening, or biliary dilatation. Pancreas: Unremarkable. No pancreatic ductal dilatation or surrounding inflammatory changes. Spleen: Normal in size without focal abnormality. Adrenals/Urinary Tract: Adrenal glands are normal. Kidneys show no hydronephrosis. The bladder is unremarkable. Stomach/Bowel: Stomach nonenlarged. No dilated small bowel. No acute bowel wall thickening. Mild diverticular disease of the left colon. Nonspecific hyperdense material within the distal colon. Vascular/Lymphatic: Aortic atherosclerosis. No enlarged abdominal or pelvic lymph nodes. Reproductive: Status post hysterectomy. No adnexal masses. Other: Negative for pelvic effusion or free air. Fat containing bilateral inguinal hernias, on the right some hazy internal density is noted. No bowel  containing hernia Musculoskeletal: Moderate chronic compression deformity at T11. IMPRESSION: 1. No CT evidence for acute intra-abdominal or pelvic abnormality. 2.  Mild diverticular disease of the left colon without acute inflammatory process. 3. Fat containing bilateral inguinal hernias, on the right some hazy internal density is noted which may be due to edema or inflammation. No bowel containing hernia. 4. 8 mm nodular airspace disease at the periphery of the right base. Non-contrast chest CT at 6-12 months is recommended. If the nodule is stable at time of repeat CT, then future CT at 18-24 months (from today's scan) is considered optional for low-risk patients, but is recommended for high-risk patients. This recommendation follows the consensus statement: Guidelines for Management of Incidental Pulmonary Nodules Detected on CT Images: From the Fleischner Society 2017; Radiology 2017; 284:228-243. 5. Aortic atherosclerosis. Electronically Signed   By: Jasmine Pang M.D.   On: 04/17/2023 18:19   US Venous Img Lower Unilateral Left Result Date: 04/17/2023 CLINICAL DATA:  leg swellin EXAM: LEFT LOWER EXTREMITY VENOUS DOPPLER ULTRASOUND TECHNIQUE: Gray-scale sonography with compression, as well as color and duplex ultrasound, were performed to evaluate the deep venous system(s) from the level of the common femoral vein through the popliteal and proximal calf veins. COMPARISON:  CT AP, 04/17/2023 FINDINGS: VENOUS Normal compressibility of the common femoral, and the visualized portions of profunda femoral and great saphenous vein. Heterogeneously-echogenic, near-occlusive filling defect within and incomplete compressibility of the imaged portions of the femoral and popliteal veins. The imaged calf veins appear patent. Limited views of the contralateral common femoral vein are unremarkable. OTHER No evidence of superficial thrombophlebitis or abnormal fluid collection. Limitations: none IMPRESSION: Acute, occlusive  LEFT femoropopliteal DVT. Roanna Banning, MD [facility] Electronically Signed   By: Roanna Banning M.D.   On: 04/17/2023 17:25   DG Chest 2 View Result Date: 04/17/2023 CLINICAL DATA:  Shortness of breath.  Melena.  Abdominal pain EXAM: CHEST - 2 VIEW COMPARISON:  X-ray and CTA chest 12/04/2022. FINDINGS: No consolidation, pneumothorax or effusion. No edema. Normal cardiopericardial silhouette. Dense right upper lung nodule consistent with old granulomatous disease and calcified focus. Osteopenia and degenerative changes along the spine. IMPRESSION: Chronic changes.  No acute cardiopulmonary disease. Electronically Signed   By: Karen Kays M.D.   On: 04/17/2023 17:11    Scheduled Inpatient Medications:    febuxostat  80 mg Oral Daily   metoprolol tartrate  25 mg Oral BID   pantoprazole (PROTONIX) IV  40 mg Intravenous Q12H   pravastatin  40 mg Oral q1800   sodium chloride flush  3 mL Intravenous Q12H    Continuous Inpatient Infusions:    sodium chloride 75 mL/hr at 04/19/23 0939   heparin Stopped (04/19/23 0812)    PRN Inpatient Medications:  albuterol, HYDROmorphone (DILAUDID) injection, ondansetron **OR** ondansetron (ZOFRAN) IV  Assessment: Epigastric pain. Melena, possible UGI bleed. Hx Breast Cancer. Acute DVT. Probable Left lung base Pulmonary embolism.   Plan:   Definitive evaluation and care for DVT/PTE. Serial H/H. Hold heparin x 4 hrs. Done. NPO.  EGD today. The patient understands the nature of the planned procedure, indications, risks, alternatives and potential complications including but not limited to bleeding, infection, perforation, damage to internal organs and possible oversedation/side effects from anesthesia. The patient agrees and gives consent to proceed.  Please refer to procedure notes for findings, recommendations and patient disposition/instructions.    Johana Hopkinson K. Norma Fredrickson, M.D. 04/19/2023, 11:16  AM

## 2023-04-20 ENCOUNTER — Encounter: Payer: Self-pay | Admitting: Internal Medicine

## 2023-04-20 ENCOUNTER — Other Ambulatory Visit: Payer: Self-pay

## 2023-04-20 ENCOUNTER — Other Ambulatory Visit (HOSPITAL_COMMUNITY): Payer: Self-pay

## 2023-04-20 ENCOUNTER — Telehealth (HOSPITAL_COMMUNITY): Payer: Self-pay | Admitting: Pharmacy Technician

## 2023-04-20 DIAGNOSIS — I82412 Acute embolism and thrombosis of left femoral vein: Secondary | ICD-10-CM | POA: Diagnosis not present

## 2023-04-20 LAB — BASIC METABOLIC PANEL
Anion gap: 9 (ref 5–15)
Anion gap: 13 (ref 5–15)
BUN: 34 mg/dL — ABNORMAL HIGH (ref 8–23)
BUN: 31 mg/dL — ABNORMAL HIGH (ref 8–23)
CO2: 23 mmol/L (ref 22–32)
CO2: 21 mmol/L — ABNORMAL LOW (ref 22–32)
Calcium: 7.9 mg/dL — ABNORMAL LOW (ref 8.9–10.3)
Calcium: 8.3 mg/dL — ABNORMAL LOW (ref 8.9–10.3)
Chloride: 103 mmol/L (ref 98–111)
Chloride: 102 mmol/L (ref 98–111)
Creatinine, Ser: 2.33 mg/dL — ABNORMAL HIGH (ref 0.44–1.00)
Creatinine, Ser: 2.2 mg/dL — ABNORMAL HIGH (ref 0.44–1.00)
GFR, Estimated: 21 mL/min — ABNORMAL LOW (ref >60)
GFR, Estimated: 22 mL/min — ABNORMAL LOW (ref >60)
Glucose, Bld: 99 mg/dL (ref 70–99)
Glucose, Bld: 86 mg/dL (ref 70–99)
Potassium: 4.8 mmol/L (ref 3.5–5.1)
Potassium: 4.5 mmol/L (ref 3.5–5.1)
Sodium: 135 mmol/L (ref 135–145)
Sodium: 136 mmol/L (ref 135–145)

## 2023-04-20 LAB — CBC
HCT: 25.9 % — ABNORMAL LOW (ref 36.0–46.0)
Hemoglobin: 8.4 g/dL — ABNORMAL LOW (ref 12.0–15.0)
MCH: 28.6 pg (ref 26.0–34.0)
MCHC: 32.4 g/dL (ref 30.0–36.0)
MCV: 88.1 fL (ref 80.0–100.0)
Platelets: 104 10*3/uL — ABNORMAL LOW (ref 150–400)
RBC: 2.94 MIL/uL — ABNORMAL LOW (ref 3.87–5.11)
RDW: 14.6 % (ref 11.5–15.5)
WBC: 8.4 10*3/uL (ref 4.0–10.5)
nRBC: 0 % (ref 0.0–0.2)

## 2023-04-20 LAB — HEMOGLOBIN: Hemoglobin: 9.2 g/dL — ABNORMAL LOW (ref 12.0–15.0)

## 2023-04-20 LAB — HEPARIN LEVEL (UNFRACTIONATED): Heparin Unfractionated: 0.47 [IU]/mL (ref 0.30–0.70)

## 2023-04-20 MED ORDER — FEBUXOSTAT 80 MG PO TABS: 40.0000 mg | ORAL_TABLET | Freq: Every day | ORAL | Status: DC

## 2023-04-20 MED ORDER — APIXABAN 5 MG PO TABS
10.0000 mg | ORAL_TABLET | Freq: Two times a day (BID) | ORAL | Status: DC
  Administered 2023-04-20: 10 mg via ORAL
  Filled 2023-04-20: qty 2

## 2023-04-20 MED ORDER — APIXABAN 5 MG PO TABS
ORAL_TABLET | ORAL | 1 refills | Status: DC
  Filled 2023-04-20: qty 74, 30d supply, fill #0

## 2023-04-20 MED ORDER — APIXABAN 5 MG PO TABS: 5.0000 mg | ORAL_TABLET | Freq: Two times a day (BID) | ORAL | Status: DC

## 2023-04-20 NOTE — Progress Notes (Signed)
 PHARMACY - ANTICOAGULATION CONSULT NOTE  Pharmacy Consult for heparin Indication: DVT  Allergies  Allergen Reactions   Codeine Nausea Only   Gabapentin     nightmares   Lipitor [Atorvastatin]     Aches    Pravachol [Pravastatin Sodium]     aches   Tramadol     nausea   Aleve [Naproxen Sodium] Rash   Sulfa Antibiotics Itching and Rash   Sulfasalazine Itching and Rash   Patient Measurements:   Heparin Dosing Weight: 50.9 kg  Vital Signs: Temp: 98.6 F (37 C) (03/14 0451) Temp Source: Oral (03/14 0451) BP: 107/57 (03/14 0451) Pulse Rate: 68 (03/14 0451)  Labs: Recent Labs    04/17/23 1535 04/17/23 1734 04/17/23 1822 04/18/23 0250 04/18/23 1112 04/18/23 1313 04/18/23 2218 04/19/23 0413 04/19/23 0653 04/19/23 2105 04/20/23 0547  HGB  --   --   --   --  10.3*  --   --  8.9*  --   --  8.4*  HCT  --   --   --   --  31.9*  --   --  26.9*  --   --  25.9*  PLT  --   --   --   --  123*  --   --  107*  --   --  104*  LABPROT  --   --  16.1*  --   --   --   --   --   --   --   --   INR  --   --  1.3*  --   --   --   --   --   --   --   --   HEPARINUNFRC  --   --   --    < >  --    < > 0.84*  --  0.73* 0.29* 0.47  CREATININE  --   --   --   --  2.20*   < > 2.00* 1.90*  --   --  2.33*  TROPONINIHS 39* 41*  --   --   --   --   --   --   --   --   --    < > = values in this interval not displayed.   Estimated Creatinine Clearance: 15.2 mL/min (A) (by C-G formula based on SCr of 2.33 mg/dL (H)).  Medical History: Past Medical History:  Diagnosis Date   Allergic rhinitis    Arthritis    joints   Cancer (HCC)    Cervical disc disease    Chronic kidney disease    STAGE 3   GERD (gastroesophageal reflux disease)    H/O blood clots    around her 20's   History of asthma    History of colon polyps    History of gout    Hyperlipidemia    Hypertension    Personal history of radiation therapy    PONV (postoperative nausea and vomiting)    nausea   Shingles     Medications:  No PTA anticoagulation per chart review  Baseline Labs Hgb 11.5 Plt 136 INR/PT - ordered  Assessment: LR is a 83 yo female who presented from [facility] with a history of 3 days of SOB and chest pain. They also report umbilical pain with radiation upwards to chest. Imaging of lower unilateral left leg is positive for acute occlusive femoropopliteal DVT. Pharmacy has been consulted to manage this patient heparin.  Hgb 11.5>10.3 (monitor closely due to reports of melena)  Goal of Therapy:  Heparin level 0.3-0.7 units/ml Monitor platelets by anticoagulation protocol: Yes    0312 1313 HL 0.17, subtherapeutic x 1 @ 600 u/hr 0312 2215 HL 0.84, elevated @ 750 units/hr 0313 0653 HL 0.73, supratherapeutic @ 650 units/hour 0313 2105 HL 0.29  0314 0547 HL 0.47, therapeutic X 1 @ 650 units/hr     Plan:  3/14:  HL @ 0547 = 0.47, therapeutic X 1 - Will continue pt on current rate and recheck HL in 8 hrs.  Thank you for involving pharmacy in this patient's care.   Ashleen Demma D Clinical Pharmacist 04/20/2023 6:12 AM

## 2023-04-20 NOTE — Discharge Instructions (Addendum)
 Information on my medicine - ELIQUIS (apixaban)  This medication education was reviewed with me or my healthcare representative as part of my discharge preparation.  Why was Eliquis prescribed for you? Eliquis was prescribed to treat blood clots that may have been found in the veins of your legs (deep vein thrombosis) or in your lungs (pulmonary embolism) and to reduce the risk of them occurring again.  What do You need to know about Eliquis ? The starting dose is 10 mg (two 5 mg tablets) taken TWICE daily for the FIRST SEVEN (7) DAYS, then on (enter date)  04/27/23  the dose is reduced to ONE 5 mg tablet taken TWICE daily.  Eliquis may be taken with or without food.   Try to take the dose about the same time in the morning and in the evening. If you have difficulty swallowing the tablet whole please discuss with your pharmacist how to take the medication safely.  Take Eliquis exactly as prescribed and DO NOT stop taking Eliquis without talking to the doctor who prescribed the medication.  Stopping may increase your risk of developing a new blood clot.  Refill your prescription before you run out.  After discharge, you should have regular check-up appointments with your healthcare provider that is prescribing your Eliquis.    What do you do if you miss a dose? If a dose of ELIQUIS is not taken at the scheduled time, take it as soon as possible on the same day and twice-daily administration should be resumed. The dose should not be doubled to make up for a missed dose.  Important Safety Information A possible side effect of Eliquis is bleeding. You should call your healthcare provider right away if you experience any of the following: Bleeding from an injury or your nose that does not stop. Unusual colored urine (red or dark brown) or unusual colored stools (red or black). Unusual bruising for unknown reasons. A serious fall or if you hit your head (even if there is no  bleeding).  Some medicines may interact with Eliquis and might increase your risk of bleeding or clotting while on Eliquis. To help avoid this, consult your healthcare provider or pharmacist prior to using any new prescription or non-prescription medications, including herbals, vitamins, non-steroidal anti-inflammatory drugs (NSAIDs) and supplements.  This website has more information on Eliquis (apixaban): http://[email or web address].com/eliquis/home

## 2023-04-20 NOTE — Discharge Summary (Signed)
 Jane Griffin ZOX:[id number] DOB: [birth year] DOA: 04/17/2023  PCP: Lauro Regulus, MD  Admit date: 04/17/2023 Discharge date: 04/20/2023  Time spent: 35 minutes  Recommendations for Outpatient Follow-up:  Pcp f/u next week as scheduled, will need check of kidney function, electrolytes, and hemoglobin at that visit Hematology f/u next month Patient has been referred to [facility], advise f/u with nephrologist  Outpatient f/u of incidental pulmonary nodule Given current kidney function we have held alendronate and reduced dose of febuxostat    Discharge Diagnoses:  Principal Problem:   Acute DVT (deep venous thrombosis) (HCC) Active Problems:   Melena   Shortness of breath   Acute kidney injury superimposed on chronic kidney disease (HCC)   PHT (pulmonary hypertension) (HCC)   Invasive ductal carcinoma of left breast (HCC)   Asthma   Chronic gouty arthritis   Coronary artery disease due to calcified coronary lesion   Discharge Condition: stable  Diet recommendation: heart healthy  There were no vitals filed for this visit.  History of present illness:  From admission h and p Jane Griffin is a 83 y.o. female with medical history significant of nonobstructive CAD, pulmonary hypertension, hypertension, hyperlipidemia, mild intermittent asthma, prediabetes, CKD stage IIIb, paroxysmal SVT, who presents to the ED due to abdominal pain, dark stool, shortness of breath.   Jane Griffin states that she has been experiencing dyspnea on exertion for quite some time now but she did feel that it acutely worsened earlier today.  She notes that this worsening occurred when she was having severe abdominal pain and now her breathing seems back to normal.  She is quite worried on what has been causing her DOE, especially since she is not quite gotten a definitive answer.   Then 4 days ago, she developed epigastric abdominal pain that would occur specifically right after eating  or drinking.  This has led to poor p.o. intake.  She has also developed melanotic stool with no hematochezia.  Last NSAID use was over 1 week ago and she uses very sparingly, as she usually takes Tylenol for pain.   Jane Griffin notes that earlier today, she noticed that her left leg was more swollen than her right.  Usually her legs are equally swollen at the ankles.  She endorses a prior history of DVT when she was very young after starting birth control.  No VTE since then.    Hospital Course:  Patient presents with abdominal pain, dark stool, and shortness of breath. For her abdominal pain CT scan was performed, unrevealing. EGD also performed, also unrevealing. Abdominal pain has, however, resolved, and she is tolerating a diet. For the shortness of breath we also discovered LLE swelling. PVL showed acute fem/pop dvt and vq scan shows probable PE. Fortunately dyspnea is resolved, no o2 requirement. Swelling also much improved. Treated with heparin here. For the dark stool there was obviously concern for gi bleed. Egd however non-diagnostic, patient had no bleeding here, and hgb although it drifted to 9 was stable there after, and that drift likely 2/2 fluids. Will transition to apixaban, will need close pcp f/u (scheduled next week) and will need close monitoring of hemoglobin and watching for signs of bleeding. Discussed case with dr. Orlie Dakin, tamoxifen may have led to the venous thrombus; given start of apixaban he advises continuing the tamoxifen for now. She will f/u with him next month. Lastly patient had aki with hyperkalemia and metabolic acidosis. Home bp meds likely contributing. Patient was treated with  IV fluids and insulin and hyperkalemia resolved, kidney function improved but remaining around 2. 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 Advise adequate hydration, hold home bp meds, and have referred patient to nephrology. Will need check of  kidney function next week with pcp. Patient also made aware of incidental pulm nodule, either pcp or her pulmonologist can follow that up.  Procedures: EGD   Consultations: GI  Discharge Exam: Vitals:   04/20/23 0803 04/20/23 1156  BP: (!) 104/56 (!) 124/54  Pulse: 70 75  Resp: 19 17  Temp: 98.8 F (37.1 C) 98.3 F (36.8 C)  SpO2: 96% 99%    General: nad Cardiovascular: rrr Respiratory: ctab Abd: non-tender, soft Ext: warm, trace LLE edema  Discharge Instructions   Discharge Instructions     Ambulatory referral to Nephrology   Complete by: As directed    Diet - low sodium heart healthy   Complete by: As directed    Increase activity slowly   Complete by: As directed       Allergies as of 04/20/2023       Reactions   Codeine Nausea Only   Gabapentin    nightmares   Lipitor [atorvastatin]    Aches   Pravachol [pravastatin Sodium]    aches   Tramadol    nausea   Aleve [naproxen Sodium] Rash   Sulfa Antibiotics Itching, Rash   Sulfasalazine Itching, Rash        Medication List     PAUSE taking these medications    alendronate 70 MG tablet Wait to take this until your doctor or other care provider tells you to start again. Commonly known as: FOSAMAX TAKE 1 TABLET BY MOUTH ONCE A WEEK. TAKE WITH A FULL GLASS OF WATER ON AN EMPTY STOMACH.       STOP taking these medications    aspirin EC 81 MG tablet   olmesartan-hydrochlorothiazide 40-25 MG tablet Commonly known as: BENICAR HCT       TAKE these medications    acetaminophen 500 MG tablet Commonly known as: TYLENOL Take 500 mg by mouth every 6 (six) hours as needed for mild pain.   albuterol (2.5 MG/3ML) 0.083% nebulizer solution Commonly known as: PROVENTIL Take 2.5 mg by nebulization every 6 (six) hours as needed for wheezing.   apixaban 5 MG Tabs tablet Commonly known as: ELIQUIS 2 tabs by mouth twice a day for 7 days. After that 1 tab twice a day.   colchicine 0.6 MG  tablet PLEASE SEE ATTACHED FOR DETAILED DIRECTIONS   Febuxostat 80 MG Tabs Take 0.5 tablets (40 mg total) by mouth daily. What changed: how much to take   fluticasone-salmeterol 100-50 MCG/ACT Aepb Commonly known as: ADVAIR Inhale 1 puff into the lungs every 12 (twelve) hours.   fluvastatin XL 80 MG 24 hr tablet Commonly known as: LESCOL XL Take 80 mg by mouth daily.   metoprolol tartrate 25 MG tablet Commonly known as: LOPRESSOR Take 25 mg by mouth 2 (two) times daily.   pantoprazole 40 MG tablet Commonly known as: PROTONIX TAKE 1 TABLET BY MOUTH ONCE A DAY 30-60 MINUTES BEFORE FIRST MEAL OF THE DAY   tamoxifen 20 MG tablet Commonly known as: NOLVADEX TAKE 1 TABLET BY MOUTH EVERY DAY       Allergies  Allergen Reactions   Codeine Nausea Only   Gabapentin     nightmares   Lipitor [Atorvastatin]  Aches    Pravachol [Pravastatin Sodium]     aches   Tramadol     nausea   Aleve [Naproxen Sodium] Rash   Sulfa Antibiotics Itching and Rash   Sulfasalazine Itching and Rash    Follow-up Information     Lauro Regulus, MD Follow up.   Specialty: Internal Medicine Contact information: [address] [facility] [place] [address] [phone]         Jeralyn Ruths, MD Follow up.   Specialty: Oncology Contact information: [address]  [place] [address] [phone]                  The results of significant diagnostics from this hospitalization (including imaging, microbiology, ancillary and laboratory) are listed below for reference.    Significant Diagnostic Studies: US RENAL Result Date: 04/19/2023 CLINICAL DATA:  [id number] AKI (acute kidney injury) (HCC) [id number] EXAM: RENAL / URINARY TRACT ULTRASOUND COMPLETE COMPARISON:  CT 04/17/2023 FINDINGS: Right Kidney: Renal measurements: 7.5 x 4.1 x 3.6 cm = volume: 57 mL. Echogenicity within normal limits. Incidentally noted 1.0 cm cyst. No solid mass or  hydronephrosis visualized. Left Kidney: Renal measurements: 8.3 x 4.2 x 4.1 cm = volume: 74 mL. Echogenicity within normal limits. Subcentimeter cyst. No solid mass or hydronephrosis visualized. Bladder: Appears normal for degree of bladder distention. Other: None. IMPRESSION: No evidence of obstructive uropathy. Electronically Signed   By: Duanne Guess D.O.   On: 04/19/2023 13:40   ECHOCARDIOGRAM COMPLETE Result Date: 04/19/2023    ECHOCARDIOGRAM REPORT   Patient Name:   Jane Griffin Date of Exam: 04/18/2023 Medical Rec #:  [id number]     Height:       62.0 in Accession #:    [id number]    Weight:       112.3 lb Date of Birth:  Feb 03, 1941     BSA:          1.496 m Patient Age:    82 years      BP:           114/48 mmHg Patient Gender: F             HR:           57 bpm. Exam Location:  [facility] Procedure: 2D Echo, Cardiac Doppler and Color Doppler (Both Spectral and Color            Flow Doppler were utilized during procedure). Indications:     I26.09 Pulmonary Embolus  History:         Patient has prior history of Echocardiogram examinations, most                  recent 12/05/2022. Risk Factors:Hypertension and Dyslipidemia.  Sonographer:     Daphine Deutscher RDCS Referring Phys:  [id number] [facility] Bryan Griffin Piedad Standiford Diagnosing Phys: Rozell Searing Custovic IMPRESSIONS  1. Left ventricular ejection fraction, by estimation, is 60 to 65%. The left ventricle has normal function. The left ventricle has no regional wall motion abnormalities. Left ventricular diastolic parameters were normal.  2. Right ventricular systolic function is normal. The right ventricular size is normal.  3. The mitral valve is normal in structure. Mild mitral valve regurgitation. No evidence of mitral stenosis.  4. The aortic valve is normal in structure. Aortic valve regurgitation is not visualized. No aortic stenosis is present.  5. The inferior vena cava is normal in size with greater than 50% respiratory variability, suggesting right atrial pressure  of 3 mmHg. FINDINGS  Left Ventricle: Left ventricular ejection fraction, by estimation, is 60 to 65%. The left ventricle has normal function. The left ventricle has no regional wall motion abnormalities. The left ventricular internal cavity size was normal in size. There is  no left ventricular hypertrophy. Left ventricular diastolic parameters were normal. Right Ventricle: The right ventricular size is normal. No increase in right ventricular wall thickness. Right ventricular systolic function is normal. Left Atrium: Left atrial size was normal in size. Right Atrium: Right atrial size was normal in size. Pericardium: There is no evidence of pericardial effusion. Mitral Valve: The mitral valve is normal in structure. Mild mitral valve regurgitation. No evidence of mitral valve stenosis. Tricuspid Valve: The tricuspid valve is normal in structure. Tricuspid valve regurgitation is mild. Aortic Valve: The aortic valve is normal in structure. Aortic valve regurgitation is not visualized. No aortic stenosis is present. Pulmonic Valve: The pulmonic valve was normal in structure. Pulmonic valve regurgitation is not visualized. Aorta: The aortic root is normal in size and structure. Venous: The inferior vena cava is normal in size with greater than 50% respiratory variability, suggesting right atrial pressure of 3 mmHg. IAS/Shunts: No atrial level shunt detected by color flow Doppler.  LEFT VENTRICLE PLAX 2D LVIDd:         3.90 cm   Diastology LVIDs:         2.30 cm   LV e' medial:    6.64 cm/s LV PW:         0.80 cm   LV E/e' medial:  11.0 LV IVS:        0.90 cm   LV e' lateral:   10.20 cm/s LVOT diam:     1.90 cm   LV E/e' lateral: 7.2 LV SV:         55 LV SV Index:   37 LVOT Area:     2.84 cm  RIGHT VENTRICLE RV Basal diam:  2.60 cm RV S prime:     12.90 cm/s TAPSE (M-mode): 1.8 cm LEFT ATRIUM             Index        RIGHT ATRIUM          Index LA diam:        3.20 cm 2.14 cm/m   RA Area:     9.11 cm LA Vol (A2C):    23.0 ml 15.38 ml/m  RA Volume:   17.40 ml 11.63 ml/m LA Vol (A4C):   33.8 ml 22.60 ml/m LA Biplane Vol: 28.2 ml 18.85 ml/m  AORTIC VALVE LVOT Vmax:   97.25 cm/s LVOT Vmean:  59.550 cm/s LVOT VTI:    0.193 m  AORTA Ao Root diam: 3.30 cm MITRAL VALVE               TRICUSPID VALVE MV Area (PHT): 3.53 cm    TR Peak grad:   26.0 mmHg MV Decel Time: 215 msec    TR Vmax:        255.00 cm/s MV E velocity: 73.10 cm/s MV A velocity: 78.85 cm/s  SHUNTS MV E/A ratio:  0.93        Systemic VTI:  0.19 m                            Systemic Diam: 1.90 cm Rozell Searing Custovic Electronically signed by Clotilde Dieter Signature Date/Time: 04/19/2023/8:48:01 AM    Final  NM Pulmonary Perfusion Result Date: 04/18/2023 CLINICAL DATA:  Shortness of breath.  Positive DVT. EXAM: NUCLEAR MEDICINE PERFUSION LUNG SCAN TECHNIQUE: Perfusion images were obtained in multiple projections after intravenous injection of radiopharmaceutical. Ventilation scans intentionally deferred if perfusion scan and chest x-ray adequate for interpretation during COVID 19 epidemic. RADIOPHARMACEUTICALS:  3.7 mCi Tc-51m MAA IV COMPARISON:  Chest x-ray 04/17/2023. FINDINGS: Focal solitary large segmental defect at the posterior left lung base. Few other small peripheral subsegmental perfusion defects. Lungs are grossly clear on x-ray. IMPRESSION: Intermediate probability perfusion lung scan based on conglomeration of findings, but with positive leg Doppler study and symptomatology with this appearance, this finding is suspicious for a pulmonary embolism. Electronically Signed   By: Karen Kays M.D.   On: 04/18/2023 12:58   CT ABDOMEN PELVIS WO CONTRAST Result Date: 04/17/2023 CLINICAL DATA:  Abdomen pain EXAM: CT ABDOMEN AND PELVIS WITHOUT CONTRAST TECHNIQUE: Multidetector CT imaging of the abdomen and pelvis was performed following the standard protocol without IV contrast. RADIATION DOSE REDUCTION: This exam was performed according to the departmental  dose-optimization program which includes automated exposure control, adjustment of the mA and/or kV according to patient size and/or use of iterative reconstruction technique. COMPARISON:  Chest x-ray 04/17/2023, CT abdomen pelvis 09/16/2015, chest CT 12/04/2022 FINDINGS: Lower chest: Lung bases demonstrate mild scarring at the lingula and left base. Negative for pleural effusion. Focal nodular airspace disease at the periphery of the right base measuring 8 mm on series 4, image 33. Coronary vascular calcification Hepatobiliary: No focal liver abnormality is seen. No gallstones, gallbladder wall thickening, or biliary dilatation. Pancreas: Unremarkable. No pancreatic ductal dilatation or surrounding inflammatory changes. Spleen: Normal in size without focal abnormality. Adrenals/Urinary Tract: Adrenal glands are normal. Kidneys show no hydronephrosis. The bladder is unremarkable. Stomach/Bowel: Stomach nonenlarged. No dilated small bowel. No acute bowel wall thickening. Mild diverticular disease of the left colon. Nonspecific hyperdense material within the distal colon. Vascular/Lymphatic: Aortic atherosclerosis. No enlarged abdominal or pelvic lymph nodes. Reproductive: Status post hysterectomy. No adnexal masses. Other: Negative for pelvic effusion or free air. Fat containing bilateral inguinal hernias, on the right some hazy internal density is noted. No bowel containing hernia Musculoskeletal: Moderate chronic compression deformity at T11. IMPRESSION: 1. No CT evidence for acute intra-abdominal or pelvic abnormality. 2. Mild diverticular disease of the left colon without acute inflammatory process. 3. Fat containing bilateral inguinal hernias, on the right some hazy internal density is noted which may be due to edema or inflammation. No bowel containing hernia. 4. 8 mm nodular airspace disease at the periphery of the right base. Non-contrast chest CT at 6-12 months is recommended. If the nodule is stable at time  of repeat CT, then future CT at 18-24 months (from today's scan) is considered optional for low-risk patients, but is recommended for high-risk patients. This recommendation follows the consensus statement: Guidelines for Management of Incidental Pulmonary Nodules Detected on CT Images: From the Fleischner Society 2017; Radiology 2017; 284:228-243. 5. Aortic atherosclerosis. Electronically Signed   By: Jasmine Pang M.D.   On: 04/17/2023 18:19   US Venous Img Lower Unilateral Left Result Date: 04/17/2023 CLINICAL DATA:  leg swellin EXAM: LEFT LOWER EXTREMITY VENOUS DOPPLER ULTRASOUND TECHNIQUE: Gray-scale sonography with compression, as well as color and duplex ultrasound, were performed to evaluate the deep venous system(s) from the level of the common femoral vein through the popliteal and proximal calf veins. COMPARISON:  CT AP, 04/17/2023 FINDINGS: VENOUS Normal compressibility of the common femoral, and the visualized  portions of profunda femoral and great saphenous vein. Heterogeneously-echogenic, near-occlusive filling defect within and incomplete compressibility of the imaged portions of the femoral and popliteal veins. The imaged calf veins appear patent. Limited views of the contralateral common femoral vein are unremarkable. OTHER No evidence of superficial thrombophlebitis or abnormal fluid collection. Limitations: none IMPRESSION: Acute, occlusive LEFT femoropopliteal DVT. Roanna Banning, MD [facility] Electronically Signed   By: Roanna Banning M.D.   On: 04/17/2023 17:25   DG Chest 2 View Result Date: 04/17/2023 CLINICAL DATA:  Shortness of breath.  Melena.  Abdominal pain EXAM: CHEST - 2 VIEW COMPARISON:  X-ray and CTA chest 12/04/2022. FINDINGS: No consolidation, pneumothorax or effusion. No edema. Normal cardiopericardial silhouette. Dense right upper lung nodule consistent with old granulomatous disease and calcified focus. Osteopenia and  degenerative changes along the spine. IMPRESSION: Chronic changes.  No acute cardiopulmonary disease. Electronically Signed   By: Karen Kays M.D.   On: 04/17/2023 17:11    Microbiology: Recent Results (from the past 240 hours)  Resp panel by RT-PCR (RSV, Flu A&B, Covid) Anterior Nasal Swab     Status: None   Collection Time: 04/17/23  3:35 PM   Specimen: Anterior Nasal Swab  Result Value Ref Range Status   SARS Coronavirus 2 by RT PCR NEGATIVE NEGATIVE Final    Comment: (NOTE) SARS-CoV-2 target nucleic acids are NOT DETECTED.  The SARS-CoV-2 RNA is generally detectable in upper respiratory specimens during the acute phase of infection. The lowest concentration of SARS-CoV-2 viral copies this assay can detect is 138 copies/mL. A negative result does not preclude SARS-Cov-2 infection and should not be used as the sole basis for treatment or other patient management decisions. A negative result may occur with  improper specimen collection/handling, submission of specimen other than nasopharyngeal swab, presence of viral mutation(s) within the areas targeted by this assay, and inadequate number of viral copies(<138 copies/mL). A negative result must be combined with clinical observations, patient history, and epidemiological information. The expected result is Negative.  Fact Sheet for Patients:  BloggerCourse.com  Fact Sheet for Healthcare Providers:  SeriousBroker.it  This test is no t yet approved or cleared by the [place] FDA and  has been authorized for detection and/or diagnosis of SARS-CoV-2 by FDA under an Emergency Use Authorization (EUA). This EUA will remain  in effect (meaning this test can be used) for the duration of the COVID-19 declaration under Section 564(b)(1) of the Act, 21 U.S.C.section 360bbb-3(b)(1), unless the authorization is terminated  or revoked sooner.       Influenza A by PCR NEGATIVE NEGATIVE  Final   Influenza B by PCR NEGATIVE NEGATIVE Final    Comment: (NOTE) The Xpert Xpress SARS-CoV-2/FLU/RSV plus assay is intended as an aid in the diagnosis of influenza from Nasopharyngeal swab specimens and should not be used as a sole basis for treatment. Nasal washings and aspirates are unacceptable for Xpert Xpress SARS-CoV-2/FLU/RSV testing.  Fact Sheet for Patients: BloggerCourse.com  Fact Sheet for Healthcare Providers: SeriousBroker.it  This test is not yet approved or cleared by the [place] FDA and has been authorized for detection and/or diagnosis of SARS-CoV-2 by FDA under an Emergency Use Authorization (EUA). This EUA will remain in effect (meaning this test can be used) for the duration of the COVID-19 declaration under Section 564(b)(1) of the Act, 21 U.S.C. section 360bbb-3(b)(1), unless the authorization is terminated or revoked.     Resp Syncytial Virus by PCR NEGATIVE NEGATIVE  Final    Comment: (NOTE) Fact Sheet for Patients: BloggerCourse.com  Fact Sheet for Healthcare Providers: SeriousBroker.it  This test is not yet approved or cleared by the [place] FDA and has been authorized for detection and/or diagnosis of SARS-CoV-2 by FDA under an Emergency Use Authorization (EUA). This EUA will remain in effect (meaning this test can be used) for the duration of the COVID-19 declaration under Section 564(b)(1) of the Act, 21 U.S.C. section 360bbb-3(b)(1), unless the authorization is terminated or revoked.  Performed at [facility], [address] [place], [place] [address]      Labs: Basic Metabolic Panel: Recent Labs  Lab 04/18/23 1810 04/18/23 2218 04/19/23 0413 04/20/23 0547 04/20/23 1244  NA 137 134* 137 135 136  K 4.8 5.3* 5.0 4.8 4.5  CL 110 111 105 103 102  CO2 16* 19* 23 23 21*  GLUCOSE 121* 83 105* 99 86  BUN 47* 42*  37* 34* 31*  CREATININE 2.27* 2.00* 1.90* 2.33* 2.20*  CALCIUM 8.7* 8.1* 8.3* 7.9* 8.3*   Liver Function Tests: Recent Labs  Lab 04/17/23 1433  AST 25  ALT 17  ALKPHOS 38  BILITOT 0.8  PROT 6.7  ALBUMIN 3.5   No results for input(s): "LIPASE", "AMYLASE" in the last 168 hours. No results for input(s): "AMMONIA" in the last 168 hours. CBC: Recent Labs  Lab 04/17/23 1433 04/18/23 1112 04/19/23 0413 04/20/23 0547 04/20/23 1244  WBC 16.6* 12.4* 9.1 8.4  --   HGB 11.5* 10.3* 8.9* 8.4* 9.2*  HCT 37.5 31.9* 26.9* 25.9*  --   MCV 93.3 88.6 85.4 88.1  --   PLT 136* 123* 107* 104*  --    Cardiac Enzymes: No results for input(s): "CKTOTAL", "CKMB", "CKMBINDEX", "TROPONINI" in the last 168 hours. BNP: BNP (last 3 results) Recent Labs    04/17/23 1433  BNP 97.7    ProBNP (last 3 results) No results for input(s): "PROBNP" in the last 8760 hours.  CBG: Recent Labs  Lab 04/18/23 1354 04/18/23 1816  GLUCAP 122* 146*       Signed:  Silvano Bilis MD.  [facility] 04/20/2023, 2:42 PM

## 2023-04-20 NOTE — Telephone Encounter (Signed)
 Patient Product/process development scientist completed.    The patient is insured through [employer]. Patient has Medicare and is not eligible for a copay card, but may be able to apply for patient assistance or Medicare RX Payment Plan (Patient Must reach out to their plan, if eligible for payment plan), if available.    Ran test claim for Eliquis Starter Pack  and the current 30 day co-pay is $40.00.   This test claim was processed through [facility]- copay amounts may vary at other pharmacies due to pharmacy/plan contracts, or as the patient moves through the different stages of their insurance plan.     Roland Earl, CPHT Pharmacy Technician III Certified Patient Advocate [facility] Patient Advocate Team Direct Number: [phone]  Fax: [phone]

## 2023-04-20 NOTE — Plan of Care (Signed)

## 2023-05-01 ENCOUNTER — Other Ambulatory Visit: Payer: Self-pay | Admitting: *Deleted

## 2023-05-01 ENCOUNTER — Inpatient Hospital Stay: Attending: Oncology | Admitting: Oncology

## 2023-05-01 ENCOUNTER — Encounter: Payer: Self-pay | Admitting: Oncology

## 2023-05-01 ENCOUNTER — Inpatient Hospital Stay

## 2023-05-01 VITALS — BP 130/67 | HR 74 | Temp 96.9°F | Resp 20 | Ht 62.0 in | Wt 113.5 lb

## 2023-05-01 DIAGNOSIS — Z7901 Long term (current) use of anticoagulants: Secondary | ICD-10-CM | POA: Insufficient documentation

## 2023-05-01 DIAGNOSIS — D509 Iron deficiency anemia, unspecified: Secondary | ICD-10-CM

## 2023-05-01 DIAGNOSIS — C50912 Malignant neoplasm of unspecified site of left female breast: Secondary | ICD-10-CM

## 2023-05-01 DIAGNOSIS — E785 Hyperlipidemia, unspecified: Secondary | ICD-10-CM | POA: Diagnosis not present

## 2023-05-01 DIAGNOSIS — Z803 Family history of malignant neoplasm of breast: Secondary | ICD-10-CM | POA: Diagnosis not present

## 2023-05-01 DIAGNOSIS — Z79899 Other long term (current) drug therapy: Secondary | ICD-10-CM | POA: Insufficient documentation

## 2023-05-01 DIAGNOSIS — D649 Anemia, unspecified: Secondary | ICD-10-CM | POA: Insufficient documentation

## 2023-05-01 DIAGNOSIS — N3289 Other specified disorders of bladder: Secondary | ICD-10-CM | POA: Diagnosis not present

## 2023-05-01 DIAGNOSIS — I129 Hypertensive chronic kidney disease with stage 1 through stage 4 chronic kidney disease, or unspecified chronic kidney disease: Secondary | ICD-10-CM | POA: Diagnosis not present

## 2023-05-01 DIAGNOSIS — M8588 Other specified disorders of bone density and structure, other site: Secondary | ICD-10-CM | POA: Insufficient documentation

## 2023-05-01 DIAGNOSIS — N183 Chronic kidney disease, stage 3 unspecified: Secondary | ICD-10-CM | POA: Diagnosis not present

## 2023-05-01 DIAGNOSIS — M109 Gout, unspecified: Secondary | ICD-10-CM | POA: Diagnosis not present

## 2023-05-01 DIAGNOSIS — Z7951 Long term (current) use of inhaled steroids: Secondary | ICD-10-CM | POA: Insufficient documentation

## 2023-05-01 DIAGNOSIS — R339 Retention of urine, unspecified: Secondary | ICD-10-CM | POA: Diagnosis not present

## 2023-05-01 DIAGNOSIS — Z86718 Personal history of other venous thrombosis and embolism: Secondary | ICD-10-CM | POA: Insufficient documentation

## 2023-05-01 DIAGNOSIS — K219 Gastro-esophageal reflux disease without esophagitis: Secondary | ICD-10-CM | POA: Insufficient documentation

## 2023-05-01 DIAGNOSIS — I82409 Acute embolism and thrombosis of unspecified deep veins of unspecified lower extremity: Secondary | ICD-10-CM

## 2023-05-01 DIAGNOSIS — Z923 Personal history of irradiation: Secondary | ICD-10-CM | POA: Diagnosis not present

## 2023-05-01 DIAGNOSIS — C50412 Malignant neoplasm of upper-outer quadrant of left female breast: Secondary | ICD-10-CM | POA: Diagnosis present

## 2023-05-01 DIAGNOSIS — Z170 Estrogen receptor positive status [ER+]: Secondary | ICD-10-CM | POA: Insufficient documentation

## 2023-05-01 DIAGNOSIS — M129 Arthropathy, unspecified: Secondary | ICD-10-CM | POA: Diagnosis not present

## 2023-05-01 DIAGNOSIS — R0602 Shortness of breath: Secondary | ICD-10-CM | POA: Insufficient documentation

## 2023-05-01 DIAGNOSIS — I081 Rheumatic disorders of both mitral and tricuspid valves: Secondary | ICD-10-CM | POA: Insufficient documentation

## 2023-05-01 LAB — RETICULOCYTES
Immature Retic Fract: 29.2 % — ABNORMAL HIGH (ref 2.3–15.9)
RBC.: 2.08 MIL/uL — ABNORMAL LOW (ref 3.87–5.11)
Retic Count, Absolute: 66.4 10*3/uL (ref 19.0–186.0)
Retic Ct Pct: 3.2 % — ABNORMAL HIGH (ref 0.4–3.1)

## 2023-05-01 LAB — CBC (CANCER CENTER ONLY)
HCT: 19.7 % — ABNORMAL LOW (ref 36.0–46.0)
Hemoglobin: 5.9 g/dL — CL (ref 12.0–15.0)
MCH: 28 pg (ref 26.0–34.0)
MCHC: 29.9 g/dL — ABNORMAL LOW (ref 30.0–36.0)
MCV: 93.4 fL (ref 80.0–100.0)
Platelet Count: 247 10*3/uL (ref 150–400)
RBC: 2.11 MIL/uL — ABNORMAL LOW (ref 3.87–5.11)
RDW: 14.9 % (ref 11.5–15.5)
WBC Count: 5.3 10*3/uL (ref 4.0–10.5)
nRBC: 0 % (ref 0.0–0.2)

## 2023-05-01 LAB — FOLATE: Folate: 19.7 ng/mL (ref >5.9)

## 2023-05-01 LAB — FERRITIN: Ferritin: 13 ng/mL (ref 11–307)

## 2023-05-01 LAB — LACTATE DEHYDROGENASE: LDH: 159 U/L (ref 98–192)

## 2023-05-01 LAB — IRON AND TIBC
Iron: 23 ug/dL — ABNORMAL LOW (ref 28–170)
Saturation Ratios: 6 % — ABNORMAL LOW (ref 10.4–31.8)
TIBC: 413 ug/dL (ref 250–450)
UIBC: 390 ug/dL

## 2023-05-01 LAB — PREPARE RBC (CROSSMATCH)

## 2023-05-01 NOTE — Progress Notes (Unsigned)
 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  Now with anemia and DVT.  INTERVAL HISTORY: Patient returns to clinic today as an add-on after recent hospital admission for a left lower extremity DVT and possible PE.  She is also noted to have a declining hemoglobin with dark stools during the admission.  She continues to have shortness of breath and dyspnea on exertion as well as increased weakness and fatigue.  She has no neurologic complaints.  She denies any recent fevers or illnesses.  She has a good appetite and denies weight loss.  She has no chest pain, cough, or hemoptysis.  She denies any nausea, vomiting, constipation, or diarrhea.  She has no further melena or hematochezia.  She has no urinary complaints.  Patient offers no further specific complaints today.  REVIEW OF SYSTEMS:   Review of Systems  Constitutional:  Positive for malaise/fatigue. Negative for fever and weight loss.  Respiratory:  Positive for shortness of breath. Negative for cough.   Cardiovascular: Negative.  Negative for chest pain and leg swelling.  Gastrointestinal: Negative.  Negative for abdominal pain.  Genitourinary: Negative.  Negative for dysuria.  Musculoskeletal:  Positive for joint pain. Negative for back pain.  Skin: Negative.  Negative for rash.  Neurological:  Positive for weakness. Negative for dizziness, focal weakness and headaches.   Psychiatric/Behavioral: Negative.  The patient is not nervous/anxious.     As per HPI. Otherwise, a complete review of systems is negative.  PAST MEDICAL HISTORY: Past Medical History:  Diagnosis Date  . Allergic rhinitis   . Arthritis    joints  . Cancer (HCC)   . Cervical disc disease   . Chronic kidney disease    STAGE 3  . GERD (gastroesophageal reflux disease)   . H/O blood clots    around her 63's  . History of asthma   . History of colon polyps   . History of gout   . Hyperlipidemia   . Hypertension   . Personal history of radiation therapy   . PONV (postoperative nausea and vomiting)    nausea  . Shingles     PAST SURGICAL HISTORY: Past Surgical History:  Procedure Laterality Date  . ABDOMINAL HYSTERECTOMY    . BREAST BIOPSY Left 10/26/2021   [place] Core Bx ribbon clip positive  . BREAST LUMPECTOMY Left 11/11/2021   Procedure: BREAST LUMPECTOMY;  Surgeon: Earline Mayotte, MD;  Location: [facility];  Service: General;  Laterality: Left;  . BREAST LUMPECTOMY Left 11/11/2021   invasive lobular ca/neg margins/closest margin .3 mm  . COLONOSCOPY  2013  . COLONOSCOPY WITH PROPOFOL N/A 10/11/2016   Procedure: COLONOSCOPY WITH PROPOFOL;  Surgeon: Kieth Brightly, MD;  Location: [facility];  Service: Endoscopy;  Laterality: N/A;  . DISTAL INTERPHALANGEAL JOINT FUSION Right 08/08/2022   Procedure: Right middle finger DISTAL INTERPHALANGEAL JOINT FUSION;  Surgeon: Kennedy Bucker, MD;  Location: [facility];  Service: Orthopedics;  Laterality: Right;  . ELBOW SURGERY Left   . ESOPHAGOGASTRODUODENOSCOPY N/A  04/19/2023   Procedure: EGD (ESOPHAGOGASTRODUODENOSCOPY);  Surgeon: Toledo, Boykin Nearing, MD;  Location: [facility];  Service: Gastroenterology;  Laterality: N/A;  . EYE SURGERY     both eyes cataract removed  . FEMORAL HERNIA REPAIR Right 09/18/2015   Procedure: HERNIA REPAIR femoral  ADULT;  Surgeon: Kieth Brightly, MD;  Location: [facility];   Service: General;  Laterality: Right;  . HERNIA REPAIR    . LAPAROSCOPIC BILATERAL SALPINGO OOPHERECTOMY Bilateral 09/06/2015   Procedure: LAPAROSCOPIC BILATERAL SALPINGO OOPHORECTOMY;  Surgeon: Suzy Bouchard, MD;  Location: [facility];  Service: Gynecology;  Laterality: Bilateral;  . PARTIAL HYSTERECTOMY    . TUBAL LIGATION    . UPPER GI ENDOSCOPY    . WRIST SURGERY Right     FAMILY HISTORY: Family History  Problem Relation Age of Onset  . Clotting disorder Mother   . Heart failure Mother   . Emphysema Father   . Heart failure Father   . Cancer Daughter        breast cancer  . Breast cancer Daughter 38    ADVANCED DIRECTIVES (Y/N):  N  HEALTH MAINTENANCE: Social History   Tobacco Use  . Smoking status: Never  . Smokeless tobacco: Never  Vaping Use  . Vaping status: Never Used  Substance Use Topics  . Alcohol use: No  . Drug use: No     Colonoscopy:  PAP:  Bone density:  Lipid panel:  Allergies  Allergen Reactions  . Codeine Nausea Only  . Gabapentin     nightmares  . Lipitor [Atorvastatin]     Aches   . Pravachol [Pravastatin Sodium]     aches  . Tramadol     nausea  . Aleve [Naproxen Sodium] Rash  . Sulfa Antibiotics Itching and Rash  . Sulfasalazine Itching and Rash    Current Outpatient Medications  Medication Sig Dispense Refill  . acetaminophen (TYLENOL) 500 MG tablet Take 500 mg by mouth every 6 (six) hours as needed for mild pain.    Marland Kitchen albuterol (PROVENTIL) (2.5 MG/3ML) 0.083% nebulizer solution Take 2.5 mg by nebulization every 6 (six) hours as needed for wheezing.    Marland Kitchen apixaban (ELIQUIS) 5 MG TABS tablet Take 2 tablets (10 mg total) by mouth 2 (two) times daily for 7 days, THEN 1 tablet (5 mg total) 2 (two) times daily. 60 tablet 1  . colchicine 0.6 MG tablet PLEASE SEE ATTACHED FOR DETAILED DIRECTIONS    . Febuxostat 80 MG TABS Take 0.5 tablets (40 mg total) by mouth daily.    . fluticasone-salmeterol (ADVAIR) 100-50 MCG/ACT AEPB  Inhale 1 puff into the lungs every 12 (twelve) hours.    . fluvastatin XL (LESCOL XL) 80 MG 24 hr tablet Take 80 mg by mouth daily.  2  . metoprolol tartrate (LOPRESSOR) 25 MG tablet Take 25 mg by mouth 2 (two) times daily.    . pantoprazole (PROTONIX) 40 MG tablet TAKE 1 TABLET BY MOUTH ONCE A DAY 30-60 MINUTES BEFORE FIRST MEAL OF THE DAY 90 tablet 1  . tamoxifen (NOLVADEX) 20 MG tablet TAKE 1 TABLET BY MOUTH EVERY DAY 90 tablet 1  . [Paused] alendronate (FOSAMAX) 70 MG tablet TAKE 1 TABLET BY MOUTH ONCE A WEEK. TAKE WITH A FULL GLASS OF WATER ON AN EMPTY STOMACH. (Patient not taking: Reported on 05/01/2023) 12 tablet 3   No current facility-administered medications for this visit.    OBJECTIVE: Vitals:   05/01/23 0927  BP: 130/67  Pulse: 74  Resp: 20  Temp: (!) 96.9 F (36.1 C)  SpO2: 100%     Body mass index is 20.76 kg/m.    ECOG FS:0 - Asymptomatic  General: Well-developed, well-nourished, no acute distress. Eyes: Pink conjunctiva, anicteric sclera. HEENT: Normocephalic, moist mucous membranes. Lungs: No audible wheezing or coughing. Heart: Regular rate and rhythm. Abdomen: Soft, nontender, no obvious distention. Musculoskeletal: No edema, cyanosis, or clubbing. Neuro: Alert, answering all questions appropriately. Cranial nerves grossly intact. Skin: No rashes or petechiae noted. Psych: Normal affect.  LAB RESULTS:  Lab Results  Component Value Date   NA 136 04/20/2023   K 4.5 04/20/2023   CL 102 04/20/2023   CO2 21 (L) 04/20/2023   GLUCOSE 86 04/20/2023   BUN 31 (H) 04/20/2023   CREATININE 2.20 (H) 04/20/2023   CALCIUM 8.3 (L) 04/20/2023   PROT 6.7 04/17/2023   ALBUMIN 3.5 04/17/2023   AST 25 04/17/2023   ALT 17 04/17/2023   ALKPHOS 38 04/17/2023   BILITOT 0.8 04/17/2023   GFRNONAA 22 (L) 04/20/2023   GFRAA 48 (L) 09/18/2015    Lab Results  Component Value Date   WBC 8.4 04/20/2023   NEUTROABS 6.1 10/10/2012   HGB 9.2 (L) 04/20/2023   HCT 25.9 (L)  04/20/2023   MCV 88.1 04/20/2023   PLT 104 (L) 04/20/2023     STUDIES: US RENAL Result Date: 04/19/2023 CLINICAL DATA:  [id number] AKI (acute kidney injury) (HCC) [id number] EXAM: RENAL / URINARY TRACT ULTRASOUND COMPLETE COMPARISON:  CT 04/17/2023 FINDINGS: Right Kidney: Renal measurements: 7.5 x 4.1 x 3.6 cm = volume: 57 mL. Echogenicity within normal limits. Incidentally noted 1.0 cm cyst. No solid mass or hydronephrosis visualized. Left Kidney: Renal measurements: 8.3 x 4.2 x 4.1 cm = volume: 74 mL. Echogenicity within normal limits. Subcentimeter cyst. No solid mass or hydronephrosis visualized. Bladder: Appears normal for degree of bladder distention. Other: None. IMPRESSION: No evidence of obstructive uropathy. Electronically Signed   By: Duanne Guess D.O.   On: 04/19/2023 13:40   ECHOCARDIOGRAM COMPLETE Result Date: 04/19/2023    ECHOCARDIOGRAM REPORT   Patient Name:   Jane Griffin Date of Exam: 04/18/2023 Medical Rec #:  [id number]     Height:       62.0 in Accession #:    [id number]    Weight:       112.3 lb Date of Birth:  [birth year]     BSA:          1.496 m Patient Age:    82 years      BP:           114/48 mmHg Patient Gender: F             HR:           57 bpm. Exam Location:  [facility] Procedure: 2D Echo, Cardiac Doppler and Color Doppler (Both Spectral and Color            Flow Doppler were utilized during procedure). Indications:     I26.09 Pulmonary Embolus  History:         Patient has prior history of Echocardiogram examinations, most                  recent 12/05/2022. Risk Factors:Hypertension and Dyslipidemia.  Sonographer:     Daphine Deutscher RDCS Referring Phys:  [id number] [facility] BEDFORD WOUK Diagnosing Phys: Rozell Searing Custovic IMPRESSIONS  1. Left ventricular ejection fraction, by estimation, is 60 to 65%. The left ventricle has normal function. The left ventricle  has no regional wall motion abnormalities. Left ventricular diastolic parameters were normal.  2. Right ventricular systolic  function is normal. The right ventricular size is normal.  3. The mitral valve is normal in structure. Mild mitral valve regurgitation. No evidence of mitral stenosis.  4. The aortic valve is normal in structure. Aortic valve regurgitation is not visualized. No aortic stenosis is present.  5. The inferior vena cava is normal in size with greater than 50% respiratory variability, suggesting right atrial pressure of 3 mmHg. FINDINGS  Left Ventricle: Left ventricular ejection fraction, by estimation, is 60 to 65%. The left ventricle has normal function. The left ventricle has no regional wall motion abnormalities. The left ventricular internal cavity size was normal in size. There is  no left ventricular hypertrophy. Left ventricular diastolic parameters were normal. Right Ventricle: The right ventricular size is normal. No increase in right ventricular wall thickness. Right ventricular systolic function is normal. Left Atrium: Left atrial size was normal in size. Right Atrium: Right atrial size was normal in size. Pericardium: There is no evidence of pericardial effusion. Mitral Valve: The mitral valve is normal in structure. Mild mitral valve regurgitation. No evidence of mitral valve stenosis. Tricuspid Valve: The tricuspid valve is normal in structure. Tricuspid valve regurgitation is mild. Aortic Valve: The aortic valve is normal in structure. Aortic valve regurgitation is not visualized. No aortic stenosis is present. Pulmonic Valve: The pulmonic valve was normal in structure. Pulmonic valve regurgitation is not visualized. Aorta: The aortic root is normal in size and structure. Venous: The inferior vena cava is normal in size with greater than 50% respiratory variability, suggesting right atrial pressure of 3 mmHg. IAS/Shunts: No atrial level shunt detected by color flow Doppler.  LEFT VENTRICLE PLAX 2D LVIDd:         3.90 cm   Diastology LVIDs:         2.30 cm   LV e' medial:    6.64 cm/s LV PW:         0.80 cm    LV E/e' medial:  11.0 LV IVS:        0.90 cm   LV e' lateral:   10.20 cm/s LVOT diam:     1.90 cm   LV E/e' lateral: 7.2 LV SV:         55 LV SV Index:   37 LVOT Area:     2.84 cm  RIGHT VENTRICLE RV Basal diam:  2.60 cm RV S prime:     12.90 cm/s TAPSE (M-mode): 1.8 cm LEFT ATRIUM             Index        RIGHT ATRIUM          Index LA diam:        3.20 cm 2.14 cm/m   RA Area:     9.11 cm LA Vol (A2C):   23.0 ml 15.38 ml/m  RA Volume:   17.40 ml 11.63 ml/m LA Vol (A4C):   33.8 ml 22.60 ml/m LA Biplane Vol: 28.2 ml 18.85 ml/m  AORTIC VALVE LVOT Vmax:   97.25 cm/s LVOT Vmean:  59.550 cm/s LVOT VTI:    0.193 m  AORTA Ao Root diam: 3.30 cm MITRAL VALVE               TRICUSPID VALVE MV Area (PHT): 3.53 cm    TR Peak grad:   26.0 mmHg MV Decel Time: 215 msec    TR Vmax:  255.00 cm/s MV E velocity: 73.10 cm/s MV A velocity: 78.85 cm/s  SHUNTS MV E/A ratio:  0.93        Systemic VTI:  0.19 m                            Systemic Diam: 1.90 cm Clotilde Dieter Electronically signed by Clotilde Dieter Signature Date/Time: 04/19/2023/8:48:01 AM    Final    NM Pulmonary Perfusion Result Date: 04/18/2023 CLINICAL DATA:  Shortness of breath.  Positive DVT. EXAM: NUCLEAR MEDICINE PERFUSION LUNG SCAN TECHNIQUE: Perfusion images were obtained in multiple projections after intravenous injection of radiopharmaceutical. Ventilation scans intentionally deferred if perfusion scan and chest x-ray adequate for interpretation during COVID 19 epidemic. RADIOPHARMACEUTICALS:  3.7 mCi Tc-11m MAA IV COMPARISON:  Chest x-ray 04/17/2023. FINDINGS: Focal solitary large segmental defect at the posterior left lung base. Few other small peripheral subsegmental perfusion defects. Lungs are grossly clear on x-ray. IMPRESSION: Intermediate probability perfusion lung scan based on conglomeration of findings, but with positive leg Doppler study and symptomatology with this appearance, this finding is suspicious for a pulmonary embolism.  Electronically Signed   By: Karen Kays M.D.   On: 04/18/2023 12:58   CT ABDOMEN PELVIS WO CONTRAST Result Date: 04/17/2023 CLINICAL DATA:  Abdomen pain EXAM: CT ABDOMEN AND PELVIS WITHOUT CONTRAST TECHNIQUE: Multidetector CT imaging of the abdomen and pelvis was performed following the standard protocol without IV contrast. RADIATION DOSE REDUCTION: This exam was performed according to the departmental dose-optimization program which includes automated exposure control, adjustment of the mA and/or kV according to patient size and/or use of iterative reconstruction technique. COMPARISON:  Chest x-ray 04/17/2023, CT abdomen pelvis 09/16/2015, chest CT 12/04/2022 FINDINGS: Lower chest: Lung bases demonstrate mild scarring at the lingula and left base. Negative for pleural effusion. Focal nodular airspace disease at the periphery of the right base measuring 8 mm on series 4, image 33. Coronary vascular calcification Hepatobiliary: No focal liver abnormality is seen. No gallstones, gallbladder wall thickening, or biliary dilatation. Pancreas: Unremarkable. No pancreatic ductal dilatation or surrounding inflammatory changes. Spleen: Normal in size without focal abnormality. Adrenals/Urinary Tract: Adrenal glands are normal. Kidneys show no hydronephrosis. The bladder is unremarkable. Stomach/Bowel: Stomach nonenlarged. No dilated small bowel. No acute bowel wall thickening. Mild diverticular disease of the left colon. Nonspecific hyperdense material within the distal colon. Vascular/Lymphatic: Aortic atherosclerosis. No enlarged abdominal or pelvic lymph nodes. Reproductive: Status post hysterectomy. No adnexal masses. Other: Negative for pelvic effusion or free air. Fat containing bilateral inguinal hernias, on the right some hazy internal density is noted. No bowel containing hernia Musculoskeletal: Moderate chronic compression deformity at T11. IMPRESSION: 1. No CT evidence for acute intra-abdominal or pelvic  abnormality. 2. Mild diverticular disease of the left colon without acute inflammatory process. 3. Fat containing bilateral inguinal hernias, on the right some hazy internal density is noted which may be due to edema or inflammation. No bowel containing hernia. 4. 8 mm nodular airspace disease at the periphery of the right base. Non-contrast chest CT at 6-12 months is recommended. If the nodule is stable at time of repeat CT, then future CT at 18-24 months (from today's scan) is considered optional for low-risk patients, but is recommended for high-risk patients. This recommendation follows the consensus statement: Guidelines for Management of Incidental Pulmonary Nodules Detected on CT Images: From the Fleischner Society 2017; Radiology 2017; 284:228-243. 5. Aortic atherosclerosis. Electronically Signed   By: Jasmine Pang  M.D.   On: 04/17/2023 18:19   US Venous Img Lower Unilateral Left Result Date: 04/17/2023 CLINICAL DATA:  leg swellin EXAM: LEFT LOWER EXTREMITY VENOUS DOPPLER ULTRASOUND TECHNIQUE: Gray-scale sonography with compression, as well as color and duplex ultrasound, were performed to evaluate the deep venous system(s) from the level of the common femoral vein through the popliteal and proximal calf veins. COMPARISON:  CT AP, 04/17/2023 FINDINGS: VENOUS Normal compressibility of the common femoral, and the visualized portions of profunda femoral and great saphenous vein. Heterogeneously-echogenic, near-occlusive filling defect within and incomplete compressibility of the imaged portions of the femoral and popliteal veins. The imaged calf veins appear patent. Limited views of the contralateral common femoral vein are unremarkable. OTHER No evidence of superficial thrombophlebitis or abnormal fluid collection. Limitations: none IMPRESSION: Acute, occlusive LEFT femoropopliteal DVT. Roanna Banning, MD [facility] Electronically Signed   By: Roanna Banning M.D.   On: 04/17/2023 17:25   DG Chest 2 View Result Date: 04/17/2023 CLINICAL DATA:  Shortness of breath.  Melena.  Abdominal pain EXAM: CHEST - 2 VIEW COMPARISON:  X-ray and CTA chest 12/04/2022. FINDINGS: No consolidation, pneumothorax or effusion. No edema. Normal cardiopericardial silhouette. Dense right upper lung nodule consistent with old granulomatous disease and calcified focus. Osteopenia and degenerative changes along the spine. IMPRESSION: Chronic changes.  No acute cardiopulmonary disease. Electronically Signed   By: Karen Kays M.D.   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  MRI of the breast completed on November 04, 2021 did not reveal any additional malignancy.  Given the small size of the mass, she is considered a T1a therefore Oncotype testing nor chemotherapy was necessary.  Patient has now completed XRT.  Given her osteoporosis, patient was prescribed tamoxifen.  Patient had a DVT while taking tamoxifen, so this will likely need to be discontinued when she discontinues her Eliquis.  She has been instructed to continue tamoxifen for now.  Despite her osteoporosis, patient may benefit from transitioning to letrozole in the future.  Patient will require repeat mammogram in December 2025.   Osteoporosis: Bone mineral density completed on January 06, 2022 revealed a T-score of -3.5.  Continue Fosamax, calcium, and vitamin D.  Continue tamoxifen for now as above.  Repeat bone mineral density in December 2025 along with mammogram as above. DVT/possible PE: Patient has been instructed to continue Eliquis and will likely require 6 months of treatment through September 2025. Anemia: Possible GI bleed: EGD reported  as negative in the hospital.  Follow-up with GI as indicated. Hypertension: Patient's blood pressure is within normal limits today.   Patient expressed understanding and was in agreement with this plan. She also understands that She can call clinic at any time with any questions, concerns, or complaints.    Cancer Staging  Invasive ductal carcinoma of left breast [facility]) Staging form: Breast, AJCC 8th Edition - Clinical stage from 11/04/2021: Stage IA (cT1a, cN0, cM0, G2, ER+, PR+, HER2-) - Signed by Jeralyn Ruths, MD on 11/04/2021 Stage prefix: Initial diagnosis Histologic grading system: 3 grade system  Jeralyn Ruths, MD   05/01/2023 10:20 AM

## 2023-05-01 NOTE — Progress Notes (Unsigned)
 Has had really bad SOB. Had a confirmed blood clot in left leg and possibly one in her lungs. Hemoglobin has been running low as well. Wanted to know if she is going to have labs checked here. Was told in the hospital that her tamoxifen can cause blood clots.

## 2023-05-02 ENCOUNTER — Inpatient Hospital Stay

## 2023-05-02 DIAGNOSIS — C50412 Malignant neoplasm of upper-outer quadrant of left female breast: Secondary | ICD-10-CM | POA: Diagnosis not present

## 2023-05-02 DIAGNOSIS — D509 Iron deficiency anemia, unspecified: Secondary | ICD-10-CM

## 2023-05-02 LAB — HAPTOGLOBIN: Haptoglobin: 172 mg/dL (ref 42–346)

## 2023-05-02 LAB — ERYTHROPOIETIN: Erythropoietin: 98.2 m[IU]/mL — ABNORMAL HIGH (ref 2.6–18.5)

## 2023-05-02 MED ORDER — ACETAMINOPHEN 325 MG PO TABS
650.0000 mg | ORAL_TABLET | Freq: Once | ORAL | Status: AC
  Administered 2023-05-02: 650 mg via ORAL
  Filled 2023-05-02: qty 2

## 2023-05-02 MED ORDER — DIPHENHYDRAMINE HCL 50 MG/ML IJ SOLN
25.0000 mg | Freq: Once | INTRAMUSCULAR | Status: AC
  Administered 2023-05-02: 25 mg via INTRAVENOUS
  Filled 2023-05-02: qty 1

## 2023-05-02 MED ORDER — SODIUM CHLORIDE 0.9% IV SOLUTION
250.0000 mL | INTRAVENOUS | Status: DC
Start: 2023-05-02 — End: 2023-05-02
  Administered 2023-05-02: 50 mL via INTRAVENOUS
  Filled 2023-05-02: qty 250

## 2023-05-02 NOTE — Patient Instructions (Signed)

## 2023-05-03 LAB — BPAM RBC
Blood Product Expiration Date: 202504222359
Blood Product Unit Number: 202504192359
ISSUE DATE / TIME: 202503260826
PRODUCT CODE: 202503261022
PRODUCT CODE: 202504222359
Unit Type and Rh: 5100
Unit Type and Rh: 202504192359
Unit Type and Rh: 5100
Unit Type and Rh: 5100

## 2023-05-03 LAB — TYPE AND SCREEN
ABO/RH(D): O POS
Antibody Screen: NEGATIVE
Unit division: 0
Unit division: 0

## 2023-05-16 ENCOUNTER — Other Ambulatory Visit: Payer: Self-pay | Admitting: *Deleted

## 2023-05-16 DIAGNOSIS — D509 Iron deficiency anemia, unspecified: Secondary | ICD-10-CM

## 2023-05-17 ENCOUNTER — Inpatient Hospital Stay: Attending: Oncology

## 2023-05-17 ENCOUNTER — Inpatient Hospital Stay

## 2023-05-17 ENCOUNTER — Inpatient Hospital Stay: Admitting: Oncology

## 2023-05-17 ENCOUNTER — Encounter: Payer: Self-pay | Admitting: Oncology

## 2023-05-17 ENCOUNTER — Other Ambulatory Visit: Payer: Self-pay | Admitting: Oncology

## 2023-05-17 ENCOUNTER — Other Ambulatory Visit: Payer: Self-pay | Admitting: *Deleted

## 2023-05-17 VITALS — BP 130/52 | HR 80 | Temp 98.2°F | Resp 18 | Ht 62.0 in | Wt 116.5 lb

## 2023-05-17 DIAGNOSIS — M129 Arthropathy, unspecified: Secondary | ICD-10-CM | POA: Diagnosis not present

## 2023-05-17 DIAGNOSIS — K219 Gastro-esophageal reflux disease without esophagitis: Secondary | ICD-10-CM | POA: Insufficient documentation

## 2023-05-17 DIAGNOSIS — E785 Hyperlipidemia, unspecified: Secondary | ICD-10-CM | POA: Diagnosis not present

## 2023-05-17 DIAGNOSIS — Z170 Estrogen receptor positive status [ER+]: Secondary | ICD-10-CM | POA: Insufficient documentation

## 2023-05-17 DIAGNOSIS — Z79899 Other long term (current) drug therapy: Secondary | ICD-10-CM | POA: Diagnosis not present

## 2023-05-17 DIAGNOSIS — D509 Iron deficiency anemia, unspecified: Secondary | ICD-10-CM | POA: Insufficient documentation

## 2023-05-17 DIAGNOSIS — I081 Rheumatic disorders of both mitral and tricuspid valves: Secondary | ICD-10-CM | POA: Insufficient documentation

## 2023-05-17 DIAGNOSIS — I700 Atherosclerosis of aorta: Secondary | ICD-10-CM | POA: Insufficient documentation

## 2023-05-17 DIAGNOSIS — Z803 Family history of malignant neoplasm of breast: Secondary | ICD-10-CM | POA: Diagnosis not present

## 2023-05-17 DIAGNOSIS — Z860101 Personal history of adenomatous and serrated colon polyps: Secondary | ICD-10-CM | POA: Diagnosis not present

## 2023-05-17 DIAGNOSIS — Z923 Personal history of irradiation: Secondary | ICD-10-CM | POA: Insufficient documentation

## 2023-05-17 DIAGNOSIS — I129 Hypertensive chronic kidney disease with stage 1 through stage 4 chronic kidney disease, or unspecified chronic kidney disease: Secondary | ICD-10-CM | POA: Diagnosis not present

## 2023-05-17 DIAGNOSIS — Z86718 Personal history of other venous thrombosis and embolism: Secondary | ICD-10-CM | POA: Insufficient documentation

## 2023-05-17 DIAGNOSIS — Z7951 Long term (current) use of inhaled steroids: Secondary | ICD-10-CM | POA: Diagnosis not present

## 2023-05-17 DIAGNOSIS — C50412 Malignant neoplasm of upper-outer quadrant of left female breast: Secondary | ICD-10-CM | POA: Insufficient documentation

## 2023-05-17 DIAGNOSIS — N183 Chronic kidney disease, stage 3 unspecified: Secondary | ICD-10-CM | POA: Diagnosis not present

## 2023-05-17 DIAGNOSIS — D649 Anemia, unspecified: Secondary | ICD-10-CM | POA: Diagnosis not present

## 2023-05-17 DIAGNOSIS — Z7901 Long term (current) use of anticoagulants: Secondary | ICD-10-CM | POA: Diagnosis not present

## 2023-05-17 LAB — CBC WITH DIFFERENTIAL/PLATELET
Abs Immature Granulocytes: 0.04 10*3/uL (ref 0.00–0.07)
Basophils Absolute: 0 10*3/uL (ref 0.0–0.1)
Basophils Relative: 1 %
Eosinophils Absolute: 0.1 10*3/uL (ref 0.0–0.5)
Eosinophils Relative: 2 %
HCT: 23.2 % — ABNORMAL LOW (ref 36.0–46.0)
Hemoglobin: 7.3 g/dL — ABNORMAL LOW (ref 12.0–15.0)
Immature Granulocytes: 1 %
Lymphocytes Relative: 14 %
Lymphs Abs: 0.9 10*3/uL (ref 0.7–4.0)
MCH: 28.5 pg (ref 26.0–34.0)
MCHC: 31.5 g/dL (ref 30.0–36.0)
MCV: 90.6 fL (ref 80.0–100.0)
Monocytes Absolute: 0.7 10*3/uL (ref 0.1–1.0)
Monocytes Relative: 12 %
Neutro Abs: 4.4 10*3/uL (ref 1.7–7.7)
Neutrophils Relative %: 70 %
Platelets: 273 10*3/uL (ref 150–400)
RBC: 2.56 MIL/uL — ABNORMAL LOW (ref 3.87–5.11)
RDW: 15.4 % (ref 11.5–15.5)
WBC: 6.2 10*3/uL (ref 4.0–10.5)
nRBC: 0 % (ref 0.0–0.2)

## 2023-05-17 LAB — IRON AND TIBC
Iron: 39 ug/dL (ref 28–170)
Saturation Ratios: 10 % — ABNORMAL LOW (ref 10.4–31.8)
TIBC: 389 ug/dL (ref 250–450)
UIBC: 350 ug/dL

## 2023-05-17 LAB — FERRITIN: Ferritin: 11 ng/mL (ref 11–307)

## 2023-05-17 LAB — PREPARE RBC (CROSSMATCH)

## 2023-05-17 NOTE — Progress Notes (Signed)
 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  Now with anemia and DVT.  INTERVAL HISTORY: Patient returns to clinic today for repeat laboratory work, further evaluation, consideration of additional blood.  She feels significantly improved after receiving 2 units of packed red blood cells 2 weeks ago.  Her shortness of breath and dyspnea on exertion have resolved.  She does not complain of weakness or fatigue today.  She has no neurologic complaints.  She denies any recent fevers or illnesses.  She has a good appetite and denies weight loss.  She has no chest pain, cough, or hemoptysis.  She denies any nausea, vomiting, constipation, or diarrhea.  She has no further melena or hematochezia.  She has no urinary complaints.  Patient offers no further specific complaints today.  REVIEW OF SYSTEMS:   Review of Systems  Constitutional: Negative.  Negative for fever, malaise/fatigue and weight loss.  Respiratory: Negative.  Negative for cough and shortness of breath.   Cardiovascular: Negative.  Negative for chest pain and leg swelling.  Gastrointestinal: Negative.  Negative for abdominal pain.  Genitourinary: Negative.  Negative for dysuria.  Musculoskeletal: Negative.  Negative for back pain and joint pain.  Skin: Negative.  Negative for rash.  Neurological: Negative.  Negative for dizziness, focal weakness, weakness and headaches.  Psychiatric/Behavioral: Negative.  The  patient is not nervous/anxious.     As per HPI. Otherwise, a complete review of systems is negative.  PAST MEDICAL HISTORY: Past Medical History:  Diagnosis Date   Allergic rhinitis    Arthritis    joints   Cancer (HCC)    Cervical disc disease    Chronic kidney disease    STAGE 3   GERD (gastroesophageal reflux disease)    H/O blood clots    around her 20's   History of asthma    History of colon polyps    History of gout    Hyperlipidemia    Hypertension    Personal history of radiation therapy    PONV (postoperative nausea and vomiting)    nausea   Shingles     PAST SURGICAL HISTORY: Past Surgical History:  Procedure Laterality Date   ABDOMINAL HYSTERECTOMY     BREAST BIOPSY Left 10/26/2021   [place] Core Bx ribbon clip positive   BREAST LUMPECTOMY Left 11/11/2021   Procedure: BREAST LUMPECTOMY;  Surgeon: Earline Mayotte, MD;  Location: [facility];  Service: General;  Laterality: Left;   BREAST LUMPECTOMY Left 11/11/2021   invasive lobular ca/neg margins/closest margin .3 mm   COLONOSCOPY  2013   COLONOSCOPY WITH PROPOFOL N/A 10/11/2016   Procedure: COLONOSCOPY WITH PROPOFOL;  Surgeon: Kieth Brightly, MD;  Location: [facility];  Service: Endoscopy;  Laterality: N/A;   DISTAL INTERPHALANGEAL JOINT FUSION Right 08/08/2022   Procedure: Right middle finger DISTAL INTERPHALANGEAL JOINT FUSION;  Surgeon: Kennedy Bucker, MD;  Location: [facility];  Service: Orthopedics;  Laterality: Right;   ELBOW SURGERY Left    ESOPHAGOGASTRODUODENOSCOPY N/A 04/19/2023   Procedure: EGD (ESOPHAGOGASTRODUODENOSCOPY);  Surgeon: Coarsegold, Brillion,  MD;  Location: [facility];  Service: Gastroenterology;  Laterality: N/A;   EYE SURGERY     both eyes cataract removed   FEMORAL HERNIA REPAIR Right 09/18/2015   Procedure: HERNIA REPAIR femoral  ADULT;  Surgeon: Kieth Brightly, MD;  Location: [facility];  Service: General;  Laterality: Right;   HERNIA REPAIR      LAPAROSCOPIC BILATERAL SALPINGO OOPHERECTOMY Bilateral 09/06/2015   Procedure: LAPAROSCOPIC BILATERAL SALPINGO OOPHORECTOMY;  Surgeon: Suzy Bouchard, MD;  Location: [facility];  Service: Gynecology;  Laterality: Bilateral;   PARTIAL HYSTERECTOMY     TUBAL LIGATION     UPPER GI ENDOSCOPY     WRIST SURGERY Right     FAMILY HISTORY: Family History  Problem Relation Age of Onset   Clotting disorder Mother    Heart failure Mother    Emphysema Father    Heart failure Father    Cancer Daughter        breast cancer   Breast cancer Daughter 49    ADVANCED DIRECTIVES (Y/N):  N  HEALTH MAINTENANCE: Social History   Tobacco Use   Smoking status: Never   Smokeless tobacco: Never  Vaping Use   Vaping status: Never Used  Substance Use Topics   Alcohol use: No   Drug use: No     Colonoscopy:  PAP:  Bone density:  Lipid panel:  Allergies  Allergen Reactions   Codeine Nausea Only   Gabapentin     nightmares   Lipitor [Atorvastatin]     Aches    Pravachol [Pravastatin Sodium]     aches   Tramadol     nausea   Aleve [Naproxen Sodium] Rash   Sulfa Antibiotics Itching and Rash   Sulfasalazine Itching and Rash    Current Outpatient Medications  Medication Sig Dispense Refill   acetaminophen (TYLENOL) 500 MG tablet Take 500 mg by mouth every 6 (six) hours as needed for mild pain.     albuterol (PROVENTIL) (2.5 MG/3ML) 0.083% nebulizer solution Take 2.5 mg by nebulization every 6 (six) hours as needed for wheezing.     apixaban (ELIQUIS) 5 MG TABS tablet Take 2 tablets (10 mg total) by mouth 2 (two) times daily for 7 days, THEN 1 tablet (5 mg total) 2 (two) times daily. 60 tablet 1   colchicine 0.6 MG tablet PLEASE SEE ATTACHED FOR DETAILED DIRECTIONS     Febuxostat 80 MG TABS Take 0.5 tablets (40 mg total) by mouth daily.     fluticasone-salmeterol (ADVAIR) 100-50 MCG/ACT AEPB Inhale 1 puff into the lungs every 12 (twelve) hours.     fluvastatin XL (LESCOL XL) 80 MG 24  hr tablet Take 80 mg by mouth daily.  2   metoprolol tartrate (LOPRESSOR) 25 MG tablet Take 25 mg by mouth 2 (two) times daily.     pantoprazole (PROTONIX) 40 MG tablet TAKE 1 TABLET BY MOUTH ONCE A DAY 30-60 MINUTES BEFORE FIRST MEAL OF THE DAY 90 tablet 1   [Paused] alendronate (FOSAMAX) 70 MG tablet TAKE 1 TABLET BY MOUTH ONCE A WEEK. TAKE WITH A FULL GLASS OF WATER ON AN EMPTY STOMACH. (Patient not taking: No sig reported) 12 tablet 3   tamoxifen (NOLVADEX) 20 MG tablet TAKE 1 TABLET BY MOUTH EVERY DAY (Patient not taking: Reported on 05/17/2023) 90 tablet 1   No current facility-administered medications for this visit.    OBJECTIVE: Vitals:   05/17/23 1030  BP: (!) 130/52  Pulse: 80  Resp: 18  Temp: 98.2 F (  36.8 C)  SpO2: 100%      Body mass index is 21.31 kg/m.    ECOG FS:1 - Symptomatic but completely ambulatory  General: Well-developed, well-nourished, no acute distress. Eyes: Pink conjunctiva, anicteric sclera. HEENT: Normocephalic, moist mucous membranes. Lungs: No audible wheezing or coughing. Heart: Regular rate and rhythm. Abdomen: Soft, nontender, no obvious distention. Musculoskeletal: No edema, cyanosis, or clubbing. Neuro: Alert, answering all questions appropriately. Cranial nerves grossly intact. Skin: No rashes or petechiae noted. Psych: Normal affect.  LAB RESULTS:  Lab Results  Component Value Date   NA 136 04/20/2023   K 4.5 04/20/2023   CL 102 04/20/2023   CO2 21 (L) 04/20/2023   GLUCOSE 86 04/20/2023   BUN 31 (H) 04/20/2023   CREATININE 2.20 (H) 04/20/2023   CALCIUM 8.3 (L) 04/20/2023   PROT 6.7 04/17/2023   ALBUMIN 3.5 04/17/2023   AST 25 04/17/2023   ALT 17 04/17/2023   ALKPHOS 38 04/17/2023   BILITOT 0.8 04/17/2023   GFRNONAA 22 (L) 04/20/2023   GFRAA 48 (L) 09/18/2015    Lab Results  Component Value Date   WBC 6.2 05/17/2023   NEUTROABS 4.4 05/17/2023   HGB 7.3 (L) 05/17/2023   HCT 23.2 (L) 05/17/2023   MCV 90.6 05/17/2023    PLT 273 05/17/2023   Lab Results  Component Value Date   IRON 39 05/17/2023   TIBC 389 05/17/2023   IRONPCTSAT 10 (L) 05/17/2023   Lab Results  Component Value Date   FERRITIN 11 05/17/2023     STUDIES: US RENAL Result Date: 04/19/2023 CLINICAL DATA:  [id number] AKI (acute kidney injury) (HCC) [id number] EXAM: RENAL / URINARY TRACT ULTRASOUND COMPLETE COMPARISON:  CT 04/17/2023 FINDINGS: Right Kidney: Renal measurements: 7.5 x 4.1 x 3.6 cm = volume: 57 mL. Echogenicity within normal limits. Incidentally noted 1.0 cm cyst. No solid mass or hydronephrosis visualized. Left Kidney: Renal measurements: 8.3 x 4.2 x 4.1 cm = volume: 74 mL. Echogenicity within normal limits. Subcentimeter cyst. No solid mass or hydronephrosis visualized. Bladder: Appears normal for degree of bladder distention. Other: None. IMPRESSION: No evidence of obstructive uropathy. Electronically Signed   By: Duanne Guess D.O.   On: 04/19/2023 13:40   ECHOCARDIOGRAM COMPLETE Result Date: 04/19/2023    ECHOCARDIOGRAM REPORT   Patient Name:   ZARINA PE Date of Exam: 04/18/2023 Medical Rec #:  [id number]     Height:       62.0 in Accession #:    [id number]    Weight:       112.3 lb Date of Birth:  14-Aug-1940     BSA:          1.496 m Patient Age:    82 years      BP:           114/48 mmHg Patient Gender: F             HR:           57 bpm. Exam Location:  [facility] Procedure: 2D Echo, Cardiac Doppler and Color Doppler (Both Spectral and Color            Flow Doppler were utilized during procedure). Indications:     I26.09 Pulmonary Embolus  History:         Patient has prior history of Echocardiogram examinations, most                  recent 12/05/2022. Risk Factors:Hypertension and Dyslipidemia.  Sonographer:  Sedonia Small Rodgers-Jones RDCS Referring Phys:  [id number] [facility] EAVW Diagnosing Phys: Rozell Searing Custovic IMPRESSIONS  1. Left ventricular ejection fraction, by estimation, is 60 to 65%. The left ventricle has normal function. The  left ventricle has no regional wall motion abnormalities. Left ventricular diastolic parameters were normal.  2. Right ventricular systolic function is normal. The right ventricular size is normal.  3. The mitral valve is normal in structure. Mild mitral valve regurgitation. No evidence of mitral stenosis.  4. The aortic valve is normal in structure. Aortic valve regurgitation is not visualized. No aortic stenosis is present.  5. The inferior vena cava is normal in size with greater than 50% respiratory variability, suggesting right atrial pressure of 3 mmHg. FINDINGS  Left Ventricle: Left ventricular ejection fraction, by estimation, is 60 to 65%. The left ventricle has normal function. The left ventricle has no regional wall motion abnormalities. The left ventricular internal cavity size was normal in size. There is  no left ventricular hypertrophy. Left ventricular diastolic parameters were normal. Right Ventricle: The right ventricular size is normal. No increase in right ventricular wall thickness. Right ventricular systolic function is normal. Left Atrium: Left atrial size was normal in size. Right Atrium: Right atrial size was normal in size. Pericardium: There is no evidence of pericardial effusion. Mitral Valve: The mitral valve is normal in structure. Mild mitral valve regurgitation. No evidence of mitral valve stenosis. Tricuspid Valve: The tricuspid valve is normal in structure. Tricuspid valve regurgitation is mild. Aortic Valve: The aortic valve is normal in structure. Aortic valve regurgitation is not visualized. No aortic stenosis is present. Pulmonic Valve: The pulmonic valve was normal in structure. Pulmonic valve regurgitation is not visualized. Aorta: The aortic root is normal in size and structure. Venous: The inferior vena cava is normal in size with greater than 50% respiratory variability, suggesting right atrial pressure of 3 mmHg. IAS/Shunts: No atrial level shunt detected by color flow  Doppler.  LEFT VENTRICLE PLAX 2D LVIDd:         3.90 cm   Diastology LVIDs:         2.30 cm   LV e' medial:    6.64 cm/s LV PW:         0.80 cm   LV E/e' medial:  11.0 LV IVS:        0.90 cm   LV e' lateral:   10.20 cm/s LVOT diam:     1.90 cm   LV E/e' lateral: 7.2 LV SV:         55 LV SV Index:   37 LVOT Area:     2.84 cm  RIGHT VENTRICLE RV Basal diam:  2.60 cm RV S prime:     12.90 cm/s TAPSE (M-mode): 1.8 cm LEFT ATRIUM             Index        RIGHT ATRIUM          Index LA diam:        3.20 cm 2.14 cm/m   RA Area:     9.11 cm LA Vol (A2C):   23.0 ml 15.38 ml/m  RA Volume:   17.40 ml 11.63 ml/m LA Vol (A4C):   33.8 ml 22.60 ml/m LA Biplane Vol: 28.2 ml 18.85 ml/m  AORTIC VALVE LVOT Vmax:   97.25 cm/s LVOT Vmean:  59.550 cm/s LVOT VTI:    0.193 m  AORTA Ao Root diam: 3.30 cm MITRAL VALVE  TRICUSPID VALVE MV Area (PHT): 3.53 cm    TR Peak grad:   26.0 mmHg MV Decel Time: 215 msec    TR Vmax:        255.00 cm/s MV E velocity: 73.10 cm/s MV A velocity: 78.85 cm/s  SHUNTS MV E/A ratio:  0.93        Systemic VTI:  0.19 m                            Systemic Diam: 1.90 cm Clotilde Dieter Electronically signed by Clotilde Dieter Signature Date/Time: 04/19/2023/8:48:01 AM    Final    NM Pulmonary Perfusion Result Date: 04/18/2023 CLINICAL DATA:  Shortness of breath.  Positive DVT. EXAM: NUCLEAR MEDICINE PERFUSION LUNG SCAN TECHNIQUE: Perfusion images were obtained in multiple projections after intravenous injection of radiopharmaceutical. Ventilation scans intentionally deferred if perfusion scan and chest x-ray adequate for interpretation during COVID 19 epidemic. RADIOPHARMACEUTICALS:  3.7 mCi Tc-19m MAA IV COMPARISON:  Chest x-ray 04/17/2023. FINDINGS: Focal solitary large segmental defect at the posterior left lung base. Few other small peripheral subsegmental perfusion defects. Lungs are grossly clear on x-ray. IMPRESSION: Intermediate probability perfusion lung scan based on conglomeration  of findings, but with positive leg Doppler study and symptomatology with this appearance, this finding is suspicious for a pulmonary embolism. Electronically Signed   By: Karen Kays M.D.   On: 04/18/2023 12:58   CT ABDOMEN PELVIS WO CONTRAST Result Date: 04/17/2023 CLINICAL DATA:  Abdomen pain EXAM: CT ABDOMEN AND PELVIS WITHOUT CONTRAST TECHNIQUE: Multidetector CT imaging of the abdomen and pelvis was performed following the standard protocol without IV contrast. RADIATION DOSE REDUCTION: This exam was performed according to the departmental dose-optimization program which includes automated exposure control, adjustment of the mA and/or kV according to patient size and/or use of iterative reconstruction technique. COMPARISON:  Chest x-ray 04/17/2023, CT abdomen pelvis 09/16/2015, chest CT 12/04/2022 FINDINGS: Lower chest: Lung bases demonstrate mild scarring at the lingula and left base. Negative for pleural effusion. Focal nodular airspace disease at the periphery of the right base measuring 8 mm on series 4, image 33. Coronary vascular calcification Hepatobiliary: No focal liver abnormality is seen. No gallstones, gallbladder wall thickening, or biliary dilatation. Pancreas: Unremarkable. No pancreatic ductal dilatation or surrounding inflammatory changes. Spleen: Normal in size without focal abnormality. Adrenals/Urinary Tract: Adrenal glands are normal. Kidneys show no hydronephrosis. The bladder is unremarkable. Stomach/Bowel: Stomach nonenlarged. No dilated small bowel. No acute bowel wall thickening. Mild diverticular disease of the left colon. Nonspecific hyperdense material within the distal colon. Vascular/Lymphatic: Aortic atherosclerosis. No enlarged abdominal or pelvic lymph nodes. Reproductive: Status post hysterectomy. No adnexal masses. Other: Negative for pelvic effusion or free air. Fat containing bilateral inguinal hernias, on the right some hazy internal density is noted. No bowel  containing hernia Musculoskeletal: Moderate chronic compression deformity at T11. IMPRESSION: 1. No CT evidence for acute intra-abdominal or pelvic abnormality. 2. Mild diverticular disease of the left colon without acute inflammatory process. 3. Fat containing bilateral inguinal hernias, on the right some hazy internal density is noted which may be due to edema or inflammation. No bowel containing hernia. 4. 8 mm nodular airspace disease at the periphery of the right base. Non-contrast chest CT at 6-12 months is recommended. If the nodule is stable at time of repeat CT, then future CT at 18-24 months (from today's scan) is considered optional for low-risk patients, but is recommended for high-risk patients. This recommendation  follows the consensus statement: Guidelines for Management of Incidental Pulmonary Nodules Detected on CT Images: From the Fleischner Society 2017; Radiology 2017; 284:228-243. 5. Aortic atherosclerosis. Electronically Signed   By: Jasmine Pang M.D.   On: 04/17/2023 18:19   US Venous Img Lower Unilateral Left Result Date: 04/17/2023 CLINICAL DATA:  leg swellin EXAM: LEFT LOWER EXTREMITY VENOUS DOPPLER ULTRASOUND TECHNIQUE: Gray-scale sonography with compression, as well as color and duplex ultrasound, were performed to evaluate the deep venous system(s) from the level of the common femoral vein through the popliteal and proximal calf veins. COMPARISON:  CT AP, 04/17/2023 FINDINGS: VENOUS Normal compressibility of the common femoral, and the visualized portions of profunda femoral and great saphenous vein. Heterogeneously-echogenic, near-occlusive filling defect within and incomplete compressibility of the imaged portions of the femoral and popliteal veins. The imaged calf veins appear patent. Limited views of the contralateral common femoral vein are unremarkable. OTHER No evidence of superficial thrombophlebitis or abnormal fluid collection. Limitations: none IMPRESSION: Acute, occlusive  LEFT femoropopliteal DVT. Roanna Banning, MD [facility] Electronically Signed   By: Roanna Banning M.D.   On: 04/17/2023 17:25   DG Chest 2 View Result Date: 04/17/2023 CLINICAL DATA:  Shortness of breath.  Melena.  Abdominal pain EXAM: CHEST - 2 VIEW COMPARISON:  X-ray and CTA chest 12/04/2022. FINDINGS: No consolidation, pneumothorax or effusion. No edema. Normal cardiopericardial silhouette. Dense right upper lung nodule consistent with old granulomatous disease and calcified focus. Osteopenia and degenerative changes along the spine. IMPRESSION: Chronic changes.  No acute cardiopulmonary disease. Electronically Signed   By: Karen Kays M.D.   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  MRI of the breast completed on November 04, 2021 did not reveal any additional malignancy.  Given the small size of the mass, she is considered a T1a therefore Oncotype testing nor chemotherapy was necessary.  Patient has now completed XRT.  Given her osteoporosis, patient was prescribed tamoxifen.  Patient had a DVT while taking tamoxifen, so this will likely need to be discontinued when she discontinues her Eliquis.  Repeat mammogram in December 2025 at which point we will likely discontinue both Eliquis and tamoxifen. Osteoporosis: Bone mineral density completed on January 06, 2022 revealed a T-score of -3.5.  Continue Fosamax, calcium, and vitamin D.  Continue tamoxifen for now as above.  Repeat bone mineral density in December 2025 along with mammogram as above. DVT/possible PE: Patient has been instructed to continue Eliquis as prescribed.  Will continue through December 2025 and discontinue  as above.   Anemia: Secondary to possible GI bleed.  Patient's hemoglobin improved to 7.3.  Will proceed with 1 additional unit of packed red blood cells tomorrow.   Possible GI bleed: EGD reported as negative in the hospital.  Given patient's continued declining hemoglobin, a referral sent back to GI for further evaluation.  Patient may need filter placement and discontinuation of Eliquis if hemoglobin does not stabilize.  Patient will need to hold her Eliquis 2 days prior to any procedure.  Will not recommend Lovenox bridge. Hypertension: Patient's blood pressure is within normal limits today.  Continue to hold blood pressure medications. Disposition: Return to clinic tomorrow for blood and then in 2  weeks for further evaluation, laboratory work and consideration of blood or IV iron.   Patient expressed understanding and was in agreement with this plan. She also understands that She can call clinic at any time with any questions, concerns, or complaints.    Cancer Staging  Invasive ductal carcinoma of left breast [facility]) Staging form: Breast, AJCC 8th Edition - Clinical stage from 11/04/2021: Stage IA (cT1a, cN0, cM0, G2, ER+, PR+, HER2-) - Signed by Jeralyn Ruths, MD on 11/04/2021 Stage prefix: Initial diagnosis Histologic grading system: 3 grade system  Jeralyn Ruths, MD   05/17/2023 1:26 PM

## 2023-05-17 NOTE — Progress Notes (Signed)
 Started noticing swelling in ankles and feet over the last week. Was taken off of a BP pill that had a diuretic in it when she was in the hospital. Wants clarification on if she needs to be taking the tamoxifen. She has not been taking it lately because she was not sure if she should. Wants to see if she can get something sent in for a cough.

## 2023-05-18 ENCOUNTER — Ambulatory Visit: Payer: Medicare PPO | Admitting: Oncology

## 2023-05-18 ENCOUNTER — Other Ambulatory Visit: Payer: Self-pay | Admitting: *Deleted

## 2023-05-18 ENCOUNTER — Inpatient Hospital Stay

## 2023-05-18 DIAGNOSIS — D509 Iron deficiency anemia, unspecified: Secondary | ICD-10-CM

## 2023-05-18 DIAGNOSIS — C50412 Malignant neoplasm of upper-outer quadrant of left female breast: Secondary | ICD-10-CM | POA: Diagnosis not present

## 2023-05-18 MED ORDER — SODIUM CHLORIDE 0.9% IV SOLUTION
250.0000 mL | INTRAVENOUS | Status: DC
  Administered 2023-05-18: 100 mL via INTRAVENOUS
  Filled 2023-05-18: qty 250

## 2023-05-18 MED ORDER — ACETAMINOPHEN 325 MG PO TABS
650.0000 mg | ORAL_TABLET | Freq: Once | ORAL | Status: AC
  Administered 2023-05-18: 650 mg via ORAL
  Filled 2023-05-18: qty 2

## 2023-05-18 MED ORDER — APIXABAN 5 MG PO TABS: 5.0000 mg | ORAL_TABLET | Freq: Two times a day (BID) | ORAL | 2 refills | Status: DC

## 2023-05-18 MED ORDER — DIPHENHYDRAMINE HCL 50 MG/ML IJ SOLN
25.0000 mg | Freq: Once | INTRAMUSCULAR | Status: AC
Start: 2023-05-18 — End: 2023-05-18
  Administered 2023-05-18: 25 mg via INTRAVENOUS
  Filled 2023-05-18: qty 1

## 2023-05-18 NOTE — Patient Instructions (Signed)
 Blood Transfusion, Adult A blood transfusion is a procedure in which you receive blood or a type of blood cell (blood component) through an IV. You may need a blood transfusion when you have a low blood count, which is a low number of any blood cell. This may result from a bleeding disorder, illness, injury, or surgery. The blood may come from a donor, or you may be able to have your own blood collected and stored (autologous blood donation) before a planned surgery. The blood given in a transfusion may be made up of different blood components. You may receive: Red blood cells. These carry oxygen to the cells in the body. Platelets. These help your blood to clot. Plasma. This is the liquid part of your blood. It carries proteins and other substances throughout the body. White blood cells. These help you fight infections. If you have hemophilia or another clotting disorder, you may also receive other types of blood products. Depending on the type of blood product, this procedure may take 1-4 hours to complete. Tell a health care provider about: Any bleeding problems you have. Any previous reactions you have had during a blood transfusion. Any allergies you have. All medicines you are taking, including vitamins, herbs, eye drops, creams, and over-the-counter medicines. Any surgeries you have had. Any medical conditions you have. Whether you are pregnant or may be pregnant. What are the risks? Talk with your health care provider about risks. The most common problems include: A mild allergic reaction, such as red, swollen areas of skin (hives) and itching. Fever or chills. This may be the body's response to new blood cells received. This may occur during or up to 4 hours after the transfusion. More serious problems may include: A serious allergic reaction that causes difficulty breathing or swelling around the face and lips. Transfusion-associated circulatory overload (TACO), or too much fluid in  the lungs. This may cause breathing problems. Transfusion-related acute lung injury (TRALI), which causes breathing difficulty and low oxygen in the blood. This can occur within hours of the transfusion or several days later. Iron overload. This can happen after receiving many blood transfusions over a period of time. Infection or virus being transmitted. This is rare because donated blood is carefully tested before it is given. Hemolytic transfusion reaction. This is rare. It happens when the body's defense system (immune system)tries to attack the new blood cells. Symptoms may include fever, chills, nausea, low blood pressure, and low back or chest pain. Transfusion-associated graft-versus-host disease (TAGVHD). This is rare. It happens when donated cells attack the body's healthy tissues. What happens before the procedure? You will have a blood test to check your blood type. This test is done to know what kind of blood your body will accept and to match it to the donor blood. If you are going to have a planned surgery, you may be able to do an autologous blood donation. This may be done in case you need to have a transfusion. You will have your temperature, blood pressure, and pulse checked before the transfusion. If you have had an allergic reaction to a transfusion in the past, you may be given medicine to help prevent a reaction. This medicine may be given to you by mouth (orally) or through an IV. What happens during the procedure?  An IV will be inserted into one of your veins. The bag of blood will be attached to your IV. The blood will then enter through your vein. Your temperature, blood pressure,  and pulse will be monitored during the transfusion. This monitoring is done to detect early signs of a transfusion reaction. Tell your nurse right away if you have any of these symptoms during the transfusion: Shortness of breath or trouble breathing. Chest or back pain. Fever or  chills. Itching or hives. If you have any signs or symptoms of a reaction, your transfusion will be stopped and you may be given medicine. When the transfusion is complete, your IV will be removed. Pressure may be applied to the IV site for a few minutes. A bandage (dressing)will be applied. The procedure may vary among health care providers and hospitals. What happens after the procedure? Your temperature, blood pressure, pulse, breathing rate, and blood oxygen level will be monitored until you leave the hospital or clinic. Your blood may be tested to see how you have responded to the transfusion. You may be warmed with fluids or blankets to maintain a normal body temperature. If you receive your blood transfusion in an outpatient setting, you will be told whom to contact to report any reactions. Where to find more information Visit the [employer]: redcross.org Summary A blood transfusion is a procedure in which you receive blood or a type of blood cell (blood component) through an IV. The blood given in a transfusion may be made up of different blood components. You may receive red blood cells, platelets, plasma, or white blood cells depending on the condition treated. Your temperature, blood pressure, and pulse will be monitored before, during, and after the transfusion. After the transfusion, your blood may be tested to see how your body has responded. This information is not intended to replace advice given to you by your health care provider. Make sure you discuss any questions you have with your health care provider. Document Revised: 04/22/2021 Document Reviewed: 04/22/2021 Elsevier Patient Education  2024 [employer].

## 2023-05-19 LAB — TYPE AND SCREEN
ABO/RH(D): O POS
Antibody Screen: NEGATIVE
Unit division: 0

## 2023-05-19 LAB — BPAM RBC
Blood Product Expiration Date: 202505122359
ISSUE DATE / TIME: 202504110913
Unit Type and Rh: 5100

## 2023-05-30 ENCOUNTER — Other Ambulatory Visit: Payer: Self-pay | Admitting: *Deleted

## 2023-05-30 DIAGNOSIS — D509 Iron deficiency anemia, unspecified: Secondary | ICD-10-CM

## 2023-05-31 ENCOUNTER — Encounter: Payer: Self-pay | Admitting: Oncology

## 2023-05-31 ENCOUNTER — Inpatient Hospital Stay

## 2023-05-31 ENCOUNTER — Inpatient Hospital Stay: Admitting: Oncology

## 2023-05-31 VITALS — BP 156/72 | HR 66 | Temp 98.7°F | Resp 20 | Wt 112.8 lb

## 2023-05-31 DIAGNOSIS — D509 Iron deficiency anemia, unspecified: Secondary | ICD-10-CM

## 2023-05-31 DIAGNOSIS — C50412 Malignant neoplasm of upper-outer quadrant of left female breast: Secondary | ICD-10-CM | POA: Diagnosis not present

## 2023-05-31 LAB — CBC WITH DIFFERENTIAL/PLATELET
Abs Immature Granulocytes: 0.02 10*3/uL (ref 0.00–0.07)
Basophils Absolute: 0.1 10*3/uL (ref 0.0–0.1)
Basophils Relative: 1 %
Eosinophils Absolute: 0.1 10*3/uL (ref 0.0–0.5)
Eosinophils Relative: 2 %
HCT: 31.1 % — ABNORMAL LOW (ref 36.0–46.0)
Hemoglobin: 9.5 g/dL — ABNORMAL LOW (ref 12.0–15.0)
Immature Granulocytes: 0 %
Lymphocytes Relative: 20 %
Lymphs Abs: 1 10*3/uL (ref 0.7–4.0)
MCH: 27.5 pg (ref 26.0–34.0)
MCHC: 30.5 g/dL (ref 30.0–36.0)
MCV: 89.9 fL (ref 80.0–100.0)
Monocytes Absolute: 0.6 10*3/uL (ref 0.1–1.0)
Monocytes Relative: 12 %
Neutro Abs: 3.4 10*3/uL (ref 1.7–7.7)
Neutrophils Relative %: 65 %
Platelets: 291 10*3/uL (ref 150–400)
RBC: 3.46 MIL/uL — ABNORMAL LOW (ref 3.87–5.11)
RDW: 14.9 % (ref 11.5–15.5)
WBC: 5.2 10*3/uL (ref 4.0–10.5)
nRBC: 0 % (ref 0.0–0.2)

## 2023-05-31 LAB — SAMPLE TO BLOOD BANK

## 2023-05-31 NOTE — Progress Notes (Signed)
 Patient has no concerns

## 2023-05-31 NOTE — Progress Notes (Signed)
 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  Now with anemia and DVT.  INTERVAL HISTORY: Patient returns to clinic today for repeat laboratory work, further evaluation, and consideration of additional blood.  She feels significantly improved after receiving recent transfusion.  She does not complain of any further weakness and fatigue.  She has no neurologic complaints.  She denies any recent fevers or illnesses.  She has a good appetite and denies weight loss.  She has no chest pain, shortness of breath, cough, or hemoptysis.  She denies any nausea, vomiting, constipation, or diarrhea.  She has no further melena or hematochezia.  She has no urinary complaints.  Patient offers no specific complaints today.  REVIEW OF SYSTEMS:   Review of Systems  Constitutional: Negative.  Negative for fever, malaise/fatigue and weight loss.  Respiratory: Negative.  Negative for cough and shortness of breath.   Cardiovascular: Negative.  Negative for chest pain and leg swelling.  Gastrointestinal: Negative.  Negative for abdominal pain.  Genitourinary: Negative.  Negative for dysuria.  Musculoskeletal: Negative.  Negative for back pain and joint pain.  Skin: Negative.  Negative for rash.  Neurological: Negative.  Negative for dizziness, focal weakness, weakness and headaches.  Psychiatric/Behavioral: Negative.  The patient is not nervous/anxious.     As per HPI. Otherwise, a complete  review of systems is negative.  PAST MEDICAL HISTORY: Past Medical History:  Diagnosis Date   Allergic rhinitis    Arthritis    joints   Cancer (HCC)    Cervical disc disease    Chronic kidney disease    STAGE 3   GERD (gastroesophageal reflux disease)    H/O blood clots    around her 20's   History of asthma    History of colon polyps    History of gout    Hyperlipidemia    Hypertension    Personal history of radiation therapy    PONV (postoperative nausea and vomiting)    nausea   Shingles     PAST SURGICAL HISTORY: Past Surgical History:  Procedure Laterality Date   ABDOMINAL HYSTERECTOMY     BREAST BIOPSY Left 10/26/2021   US  Core Bx ribbon clip positive   BREAST LUMPECTOMY Left 11/11/2021   Procedure: BREAST LUMPECTOMY;  Surgeon: Marshall Skeeter, MD;  Location: [facility];  Service: General;  Laterality: Left;   BREAST LUMPECTOMY Left 11/11/2021   invasive lobular ca/neg margins/closest margin .3 mm   COLONOSCOPY  2013   COLONOSCOPY WITH PROPOFOL  N/A 10/11/2016   Procedure: COLONOSCOPY WITH PROPOFOL ;  Surgeon: Jerlean Mood, MD;  Location: [facility];  Service: Endoscopy;  Laterality: N/A;   DISTAL INTERPHALANGEAL JOINT FUSION Right 08/08/2022   Procedure: Right middle finger DISTAL INTERPHALANGEAL JOINT FUSION;  Surgeon: Molli Angelucci, MD;  Location: [facility];  Service: Orthopedics;  Laterality: Right;   ELBOW SURGERY Left    ESOPHAGOGASTRODUODENOSCOPY N/A 04/19/2023   Procedure: EGD (ESOPHAGOGASTRODUODENOSCOPY);  Surgeon: Toledo, Alphonsus Jeans, MD;  Location: [facility];  Service: Gastroenterology;  Laterality: N/A;   EYE SURGERY  both eyes cataract removed   FEMORAL HERNIA REPAIR Right 09/18/2015   Procedure: HERNIA REPAIR femoral  ADULT;  Surgeon: Jerlean Mood, MD;  Location: [facility];  Service: General;  Laterality: Right;   HERNIA REPAIR     LAPAROSCOPIC BILATERAL SALPINGO OOPHERECTOMY Bilateral 09/06/2015   Procedure:  LAPAROSCOPIC BILATERAL SALPINGO OOPHORECTOMY;  Surgeon: Carolynn Citrin, MD;  Location: [facility];  Service: Gynecology;  Laterality: Bilateral;   PARTIAL HYSTERECTOMY     TUBAL LIGATION     UPPER GI ENDOSCOPY     WRIST SURGERY Right     FAMILY HISTORY: Family History  Problem Relation Age of Onset   Clotting disorder Mother    Heart failure Mother    Emphysema Father    Heart failure Father    Cancer Daughter        breast cancer   Breast cancer Daughter 61    ADVANCED DIRECTIVES (Y/N):  N  HEALTH MAINTENANCE: Social History   Tobacco Use   Smoking status: Never   Smokeless tobacco: Never  Vaping Use   Vaping status: Never Used  Substance Use Topics   Alcohol use: No   Drug use: No     Colonoscopy:  PAP:  Bone density:  Lipid panel:  Allergies  Allergen Reactions   Codeine Nausea Only   Gabapentin      nightmares   Lipitor [Atorvastatin]     Aches    Pravachol  [Pravastatin  Sodium]     aches   Tramadol      nausea   Aleve [Naproxen Sodium] Rash   Sulfa Antibiotics Itching and Rash   Sulfasalazine Itching and Rash    Current Outpatient Medications  Medication Sig Dispense Refill   acetaminophen  (TYLENOL ) 500 MG tablet Take 500 mg by mouth every 6 (six) hours as needed for mild pain.     albuterol  (PROVENTIL ) (2.5 MG/3ML) 0.083% nebulizer solution Take 2.5 mg by nebulization every 6 (six) hours as needed for wheezing.     apixaban  (ELIQUIS ) 5 MG TABS tablet Take 1 tablet (5 mg total) by mouth 2 (two) times daily. 60 tablet 2   colchicine 0.6 MG tablet PLEASE SEE ATTACHED FOR DETAILED DIRECTIONS     Febuxostat  80 MG TABS Take 0.5 tablets (40 mg total) by mouth daily.     fluticasone-salmeterol (ADVAIR) 100-50 MCG/ACT AEPB Inhale 1 puff into the lungs every 12 (twelve) hours.     fluvastatin XL (LESCOL XL) 80 MG 24 hr tablet Take 80 mg by mouth daily.  2   metoprolol  tartrate (LOPRESSOR ) 25 MG tablet Take 25 mg by mouth 2 (two) times daily.      pantoprazole  (PROTONIX ) 40 MG tablet TAKE 1 TABLET BY MOUTH ONCE A DAY 30-60 MINUTES BEFORE FIRST MEAL OF THE DAY 90 tablet 1   tamoxifen  (NOLVADEX ) 20 MG tablet TAKE 1 TABLET BY MOUTH EVERY DAY 90 tablet 1   [Paused] alendronate  (FOSAMAX ) 70 MG tablet TAKE 1 TABLET BY MOUTH ONCE A WEEK. TAKE WITH A FULL GLASS OF WATER ON AN EMPTY STOMACH. (Patient not taking: No sig reported) 12 tablet 3   No current facility-administered medications for this visit.    OBJECTIVE: Vitals:   05/31/23 1040  BP: (!) 156/72  Pulse: 66  Resp: 20  Temp: 98.7 F (37.1 C)  SpO2: 99%      Body mass index is 20.63 kg/m.    ECOG FS:0 - Asymptomatic  General: Well-developed, well-nourished, no acute distress. Eyes: Pink conjunctiva, anicteric sclera. HEENT: Normocephalic, moist  mucous membranes. Lungs: No audible wheezing or coughing. Heart: Regular rate and rhythm. Abdomen: Soft, nontender, no obvious distention. Musculoskeletal: No edema, cyanosis, or clubbing. Neuro: Alert, answering all questions appropriately. Cranial nerves grossly intact. Skin: No rashes or petechiae noted. Psych: Normal affect.  LAB RESULTS:  Lab Results  Component Value Date   NA 136 04/20/2023   K 4.5 04/20/2023   CL 102 04/20/2023   CO2 21 (L) 04/20/2023   GLUCOSE 86 04/20/2023   BUN 31 (H) 04/20/2023   CREATININE 2.20 (H) 04/20/2023   CALCIUM  8.3 (L) 04/20/2023   PROT 6.7 04/17/2023   ALBUMIN  3.5 04/17/2023   AST 25 04/17/2023   ALT 17 04/17/2023   ALKPHOS 38 04/17/2023   BILITOT 0.8 04/17/2023   GFRNONAA 22 (L) 04/20/2023   GFRAA 48 (L) 09/18/2015    Lab Results  Component Value Date   WBC 5.2 05/31/2023   NEUTROABS 3.4 05/31/2023   HGB 9.5 (L) 05/31/2023   HCT 31.1 (L) 05/31/2023   MCV 89.9 05/31/2023   PLT 291 05/31/2023   Lab Results  Component Value Date   IRON 39 05/17/2023   TIBC 389 05/17/2023   IRONPCTSAT 10 (L) 05/17/2023   Lab Results  Component Value Date   FERRITIN 11 05/17/2023      STUDIES: No results found.   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  MRI of the breast completed on November 04, 2021 did not reveal any additional malignancy.  Given the small size of the mass, she is considered a T1a therefore Oncotype testing nor chemotherapy was necessary.  Patient has now completed XRT.  Given her osteoporosis, patient was prescribed tamoxifen .  Patient had a DVT while taking tamoxifen , so this will likely need to be discontinued when she discontinues her Eliquis .  Repeat mammogram in December 2025 at which point we will likely discontinue both Eliquis  and tamoxifen . Osteoporosis: Bone mineral density completed on January 06, 2022 revealed a T-score of -3.5.  Continue Fosamax , calcium , and vitamin D.  Continue tamoxifen  for now as above.  Repeat bone mineral density in December 2025 along with mammogram as above. DVT/possible PE: Patient has been instructed to continue Eliquis  as prescribed.  Will continue through December 2025 and discontinue as above.   Anemia: Secondary to possible GI bleed.  Patient's hemoglobin improved to 9.5 with transfusion.  She does not require additional blood tomorrow.  Will also hold IV Venofer at this time.  Return to clinic in 2 weeks for laboratory work only and then in 4 weeks for laboratory work, further evaluation, and continuation of treatment if needed. Possible GI bleed: EGD reported as negative in the hospital.  Given patient's continued declining hemoglobin, a referral sent back to GI for further evaluation.  Patient may need filter placement and discontinuation of Eliquis  if hemoglobin does not stabilize.  Patient will need to hold her Eliquis  2 days prior to any procedure.  Will not  recommend Lovenox bridge. Hypertension: Blood pressure moderately elevated today.  Continue to hold blood pressure medications.   Patient expressed understanding and was in agreement with this plan. She also understands that She can call clinic at any time with any questions, concerns, or complaints.    Cancer Staging  Invasive ductal carcinoma of left breast (  HCC) Staging form: Breast, AJCC 8th Edition - Clinical stage from 11/04/2021: Stage IA (cT1a, cN0, cM0, G2, ER+, PR+, HER2-) - Signed by Shellie Dials, MD on 11/04/2021 Stage prefix: Initial diagnosis Histologic grading system: 3 grade system  Shellie Dials, MD   05/31/2023 4:56 PM

## 2023-06-01 ENCOUNTER — Inpatient Hospital Stay

## 2023-06-04 ENCOUNTER — Telehealth: Payer: Self-pay | Admitting: *Deleted

## 2023-06-04 NOTE — Telephone Encounter (Signed)
 She is not sure if she should go to colonoscopy next week. Her hgb last time was 9.5 and it seems to getting better.

## 2023-06-12 ENCOUNTER — Other Ambulatory Visit: Payer: Self-pay | Admitting: Oncology

## 2023-06-13 ENCOUNTER — Other Ambulatory Visit: Payer: Self-pay

## 2023-06-13 ENCOUNTER — Ambulatory Visit: Payer: Self-pay

## 2023-06-13 ENCOUNTER — Other Ambulatory Visit: Payer: Self-pay | Admitting: *Deleted

## 2023-06-13 ENCOUNTER — Encounter: Payer: Self-pay | Admitting: Oncology

## 2023-06-13 ENCOUNTER — Encounter
Admission: RE | Disposition: A | Discharge status: Home / Self Care | Payer: Self-pay | Source: Home / Self Care | Attending: Internal Medicine

## 2023-06-13 ENCOUNTER — Encounter: Payer: Self-pay | Admitting: Internal Medicine

## 2023-06-13 ENCOUNTER — Ambulatory Visit
Admission: RE | Admit: 2023-06-13 | Discharge: 2023-06-13 | Disposition: A | Discharge status: Home / Self Care | Attending: Internal Medicine | Admitting: Internal Medicine

## 2023-06-13 DIAGNOSIS — Z7951 Long term (current) use of inhaled steroids: Secondary | ICD-10-CM | POA: Diagnosis not present

## 2023-06-13 DIAGNOSIS — K5730 Diverticulosis of large intestine without perforation or abscess without bleeding: Secondary | ICD-10-CM | POA: Insufficient documentation

## 2023-06-13 DIAGNOSIS — Z860101 Personal history of adenomatous and serrated colon polyps: Secondary | ICD-10-CM | POA: Diagnosis not present

## 2023-06-13 DIAGNOSIS — I2510 Atherosclerotic heart disease of native coronary artery without angina pectoris: Secondary | ICD-10-CM | POA: Insufficient documentation

## 2023-06-13 DIAGNOSIS — N1832 Chronic kidney disease, stage 3b: Secondary | ICD-10-CM | POA: Diagnosis not present

## 2023-06-13 DIAGNOSIS — D509 Iron deficiency anemia, unspecified: Secondary | ICD-10-CM | POA: Diagnosis present

## 2023-06-13 DIAGNOSIS — I272 Pulmonary hypertension, unspecified: Secondary | ICD-10-CM | POA: Insufficient documentation

## 2023-06-13 DIAGNOSIS — K642 Third degree hemorrhoids: Secondary | ICD-10-CM | POA: Insufficient documentation

## 2023-06-13 DIAGNOSIS — I129 Hypertensive chronic kidney disease with stage 1 through stage 4 chronic kidney disease, or unspecified chronic kidney disease: Secondary | ICD-10-CM | POA: Diagnosis not present

## 2023-06-13 DIAGNOSIS — M109 Gout, unspecified: Secondary | ICD-10-CM | POA: Diagnosis not present

## 2023-06-13 DIAGNOSIS — Z79899 Other long term (current) drug therapy: Secondary | ICD-10-CM | POA: Insufficient documentation

## 2023-06-13 DIAGNOSIS — K317 Polyp of stomach and duodenum: Secondary | ICD-10-CM | POA: Diagnosis not present

## 2023-06-13 DIAGNOSIS — Z86718 Personal history of other venous thrombosis and embolism: Secondary | ICD-10-CM | POA: Diagnosis not present

## 2023-06-13 DIAGNOSIS — K449 Diaphragmatic hernia without obstruction or gangrene: Secondary | ICD-10-CM | POA: Diagnosis not present

## 2023-06-13 DIAGNOSIS — K219 Gastro-esophageal reflux disease without esophagitis: Secondary | ICD-10-CM | POA: Insufficient documentation

## 2023-06-13 DIAGNOSIS — J4520 Mild intermittent asthma, uncomplicated: Secondary | ICD-10-CM | POA: Diagnosis not present

## 2023-06-13 DIAGNOSIS — Z7901 Long term (current) use of anticoagulants: Secondary | ICD-10-CM | POA: Insufficient documentation

## 2023-06-13 DIAGNOSIS — E785 Hyperlipidemia, unspecified: Secondary | ICD-10-CM | POA: Insufficient documentation

## 2023-06-13 DIAGNOSIS — R0602 Shortness of breath: Secondary | ICD-10-CM | POA: Insufficient documentation

## 2023-06-13 DIAGNOSIS — K641 Second degree hemorrhoids: Secondary | ICD-10-CM | POA: Diagnosis not present

## 2023-06-13 HISTORY — PX: ESOPHAGOGASTRODUODENOSCOPY: SHX5428

## 2023-06-13 HISTORY — PX: COLONOSCOPY: SHX5424

## 2023-06-13 SURGERY — COLONOSCOPY
Anesthesia: General

## 2023-06-13 MED ORDER — PROPOFOL 500 MG/50ML IV EMUL
INTRAVENOUS | Status: DC | PRN
Start: 2023-06-13 — End: 2023-06-13
  Administered 2023-06-13: 80 mg via INTRAVENOUS
  Administered 2023-06-13: 150 ug/kg/min via INTRAVENOUS
  Administered 2023-06-13: 30 mg via INTRAVENOUS

## 2023-06-13 MED ORDER — SODIUM CHLORIDE 0.9 % IV SOLN: INTRAVENOUS | Status: DC

## 2023-06-13 MED ORDER — LIDOCAINE HCL (PF) 2 % IJ SOLN
INTRAMUSCULAR | Status: AC
  Filled 2023-06-13: qty 5

## 2023-06-13 MED ORDER — LIDOCAINE HCL (PF) 2 % IJ SOLN
INTRAMUSCULAR | Status: DC | PRN
  Administered 2023-06-13: 100 mg via INTRADERMAL

## 2023-06-13 MED ORDER — LIDOCAINE HCL (PF) 2 % IJ SOLN
INTRAMUSCULAR | Status: AC
Start: 2023-06-13 — End: ?
  Filled 2023-06-13: qty 5

## 2023-06-13 NOTE — Op Note (Signed)
 [facility] Patient Name: Brynda Dewater Procedure Date: 06/13/2023 11:59 AM MRN: [id number] Account #: [id number] Date of Birth: 13-Aug-1940 Admit Type: Outpatient Age: 83 Room: [facility] ENDO ROOM 2 Gender: Female Note Status: Finalized Instrument Name: Upper Endoscope [id number] Procedure:             Upper GI endoscopy Indications:           Unexplained iron deficiency anemia Providers:             Cecil Bixby K. Corky Diener MD, MD Referring MD:          Makylie Rivere K. Corky Diener MD, MD (Referring MD) Medicines:             Propofol  per Anesthesia Complications:         No immediate complications. Estimated blood loss:                         Minimal. Procedure:             Pre-Anesthesia Assessment:                        - The risks and benefits of the procedure and the                         sedation options and risks were discussed with the                         patient. All questions were answered and informed                         consent was obtained.                        - Patient identification and proposed procedure were                         verified prior to the procedure by the nurse. The                         procedure was verified in the procedure room.                        - ASA Grade Assessment: III - A patient with severe                         systemic disease.                        - After reviewing the risks and benefits, the patient                         was deemed in satisfactory condition to undergo the                         procedure.                        After obtaining informed consent, the endoscope was  passed under direct vision. Throughout the procedure,                         the patient's blood pressure, pulse, and oxygen                         saturations were monitored continuously. The Endoscope                         was introduced through the mouth, and advanced to the                          third part of duodenum. The upper GI endoscopy was                         accomplished without difficulty. The patient tolerated                         the procedure well. Findings:      The esophagus was normal.      Multiple small sessile polyps with no bleeding and no stigmata of recent       bleeding were found in the gastric body. Biopsies were taken with a cold       forceps for histology. Estimated blood loss was minimal.      A 2 cm hiatal hernia was present.      The exam of the stomach was otherwise normal.      The examined duodenum was normal. Impression:            - Normal esophagus.                        - Multiple gastric polyps. Biopsied.                        - 2 cm hiatal hernia.                        - Normal examined duodenum. Recommendation:        - Await pathology results.                        - Proceed with colonoscopy Procedure Code(s):     --- Professional ---                        [phone], Esophagogastroduodenoscopy, flexible,                         transoral; with biopsy, single or multiple Diagnosis Code(s):     --- Professional ---                        D50.9, Iron deficiency anemia, unspecified                        K44.9, Diaphragmatic hernia without obstruction or                         gangrene  K31.7, Polyp of stomach and duodenum CPT copyright 2022 American Medical Association. All rights reserved. The codes documented in this report are preliminary and upon coder review may  be revised to meet current compliance requirements. Cassie Click MD, MD 06/13/2023 1:00:36 PM This report has been signed electronically. Number of Addenda: 0 Note Initiated On: 06/13/2023 11:59 AM Estimated Blood Loss:  Estimated blood loss was minimal.      [facility]

## 2023-06-13 NOTE — H&P (Signed)
 Outpatient short stay form Pre-procedure 06/13/2023 12:36 PM Jane Griffin, M.D.  Primary Physician: Overton Blotter, M.D.  Reason for visit:  Iron deficiency anemia  History of present illness:  83 y/o Caucasian female with a PMH of HTN, HLD, mild intermittent asthma, prediabetes, CKD Stage IIIb, hx of breast cancer, pSVT, nonobstructive CAD, pHTN, and s/p recent hospital admission for GIB/acute LLE DVT on chronic anticoagulation presents to the [place] [facility] to establish care for persistent IDA concerning for chronic GI blood loss  Unexplained IDA concerning for chronic GI blood loss - s/p negative EGD 04/19/23  Hx of adenomatous colon polyps  Acute LLE DVT - diagnosed during recent admission 04/17/23, on Eliquis      Current Facility-Administered Medications:    0.9 %  sodium chloride  infusion, , Intravenous, Continuous, [place], Carolanne Mercier K, MD, Last Rate: 20 mL/hr at 06/13/23 1200, New Bag at 06/13/23 1200  Medications Prior to Admission  Medication Sig Dispense Refill Last Dose/Taking   acetaminophen  (TYLENOL ) 500 MG tablet Take 500 mg by mouth every 6 (six) hours as needed for mild pain.   Past Week   albuterol  (PROVENTIL ) (2.5 MG/3ML) 0.083% nebulizer solution Take 2.5 mg by nebulization every 6 (six) hours as needed for wheezing.   06/12/2023   [Paused] alendronate  (FOSAMAX ) 70 MG tablet TAKE 1 TABLET BY MOUTH ONCE A WEEK. TAKE WITH A FULL GLASS OF WATER ON AN EMPTY STOMACH. 12 tablet 3 06/12/2023   apixaban  (ELIQUIS ) 5 MG TABS tablet Take 1 tablet (5 mg total) by mouth 2 (two) times daily. 60 tablet 2 06/10/2023   colchicine 0.6 MG tablet PLEASE SEE ATTACHED FOR DETAILED DIRECTIONS   06/12/2023   Febuxostat  80 MG TABS Take 0.5 tablets (40 mg total) by mouth daily.   06/12/2023   fluvastatin XL (LESCOL XL) 80 MG 24 hr tablet Take 80 mg by mouth daily.  2 06/12/2023   metoprolol  tartrate (LOPRESSOR ) 25 MG tablet Take 25 mg by mouth 2 (two) times daily.   06/12/2023   pantoprazole   (PROTONIX ) 40 MG tablet TAKE 1 TABLET BY MOUTH ONCE A DAY 30-60 MINUTES BEFORE FIRST MEAL OF THE DAY 90 tablet 1 06/12/2023   tamoxifen  (NOLVADEX ) 20 MG tablet TAKE 1 TABLET BY MOUTH EVERY DAY 90 tablet 1 06/12/2023   fluticasone-salmeterol (ADVAIR) 100-50 MCG/ACT AEPB Inhale 1 puff into the lungs every 12 (twelve) hours.        Allergies  Allergen Reactions   Codeine Nausea Only   Gabapentin      nightmares   Lipitor [Atorvastatin]     Aches    Pravachol  [Pravastatin  Sodium]     aches   Tramadol      nausea   Aleve [Naproxen Sodium] Rash   Sulfa Antibiotics Itching and Rash   Sulfasalazine Itching and Rash     Past Medical History:  Diagnosis Date   Allergic rhinitis    Arthritis    joints   Cancer (HCC)    breast  left   Cervical disc disease    Chronic kidney disease    STAGE 3   GERD (gastroesophageal reflux disease)    H/O blood clots    around her 20's   History of asthma    History of colon polyps    History of gout    Hyperlipidemia    Hypertension    Personal history of radiation therapy    PONV (postoperative nausea and vomiting)    nausea   Shingles     Review of systems:  Otherwise negative.    Physical Exam  Gen: Alert, oriented. Appears stated age.  HEENT: [place]/AT. PERRLA. Lungs: CTA, no wheezes. CV: RR nl S1, S2. Abd: soft, benign, no masses. BS+ Ext: No edema. Pulses 2+    Planned procedures: Proceed with EGD and colonoscopy. The patient understands the nature of the planned procedure, indications, risks, alternatives and potential complications including but not limited to bleeding, infection, perforation, damage to internal organs and possible oversedation/side effects from anesthesia. The patient agrees and gives consent to proceed.  Please refer to procedure notes for findings, recommendations and patient disposition/instructions.     Fannye Myer K. Corky Griffin, M.D. Gastroenterology 06/13/2023  12:36 PM

## 2023-06-13 NOTE — Interval H&P Note (Signed)
 History and Physical Interval Note:  06/13/2023 12:42 PM  Jane Griffin  has presented today for surgery, with the diagnosis of D50.0 (ICD-10-CM) - Iron deficiency anemia due to chronic blood loss Z86.0101 (ICD-10-CM) - Hx of adenomatous colonic polyps.  The various methods of treatment have been discussed with the patient and family. After consideration of risks, benefits and other options for treatment, the patient has consented to  Procedure(s) with comments: COLONOSCOPY (N/A) - Patient on Plavix EGD (ESOPHAGOGASTRODUODENOSCOPY) (N/A) as a surgical intervention.  The patient's history has been reviewed, patient examined, no change in status, stable for surgery.  I have reviewed the patient's chart and labs.  Questions were answered to the patient's satisfaction.     [place], Fardeen Steinberger

## 2023-06-13 NOTE — Op Note (Addendum)
 [facility] Gastroenterology Patient Name: Jane Griffin Procedure Date: 06/13/2023 11:58 AM MRN: [id number] Account #: [id number] Date of Birth: Jun 30, 1940 Admit Type: Outpatient Age: 83 Room: [facility] ENDO ROOM 2 Gender: Female Note Status: Supervisor Override Instrument Name: Peds Colonoscope [id number] Procedure:             Colonoscopy Indications:           Unexplained iron deficiency anemia, Follow-up for                         history of colon polyps Providers:             Ezzard Ditmer K. Corky Diener MD, MD Referring MD:          Tylea Hise K. Corky Diener MD, MD (Referring MD) Medicines:             Propofol  per Anesthesia Complications:         No immediate complications. Estimated blood loss: None. Procedure:             Pre-Anesthesia Assessment:                        - The risks and benefits of the procedure and the                         sedation options and risks were discussed with the                         patient. All questions were answered and informed                         consent was obtained.                        - Patient identification and proposed procedure were                         verified prior to the procedure by the nurse. The                         procedure was verified in the procedure room.                        - ASA Grade Assessment: III - A patient with severe                         systemic disease.                        - After reviewing the risks and benefits, the patient                         was deemed in satisfactory condition to undergo the                         procedure.                        After obtaining informed consent, the colonoscope was  passed under direct vision. Throughout the procedure,                         the patient's blood pressure, pulse, and oxygen                         saturations were monitored continuously. The                         Colonoscope was introduced through the  anus and                         advanced to the the cecum, identified by appendiceal                         orifice and ileocecal valve. The colonoscopy was                         performed without difficulty. The patient tolerated                         the procedure well. The quality of the bowel                         preparation was adequate. The ileocecal valve,                         appendiceal orifice, and rectum were photographed. Findings:      The perianal exam findings include internal hemorrhoids that prolapse       with straining, but require manual replacement into the anal canal       (Grade III).      Noted was incomplete prolapse of the rectum      Many small-mouthed diverticula were found in the sigmoid colon.      Non-bleeding internal hemorrhoids were found during retroflexion. The       hemorrhoids were Grade II (internal hemorrhoids that prolapse but reduce       spontaneously).      The exam was otherwise without abnormality. 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                        - Diverticulosis in the sigmoid colon.                        - Non-bleeding internal hemorrhoids.                        - The examination was otherwise normal.                        - No specimens collected. Recommendation:        - Await pathology  results from EGD, also performed                         today.                        - Patient has a contact number available for                         emergencies. The signs and symptoms of potential                         delayed complications were discussed with the patient.                         Return to normal activities tomorrow. Written                         discharge instructions were provided to the patient.                        - Resume  previous diet.                        - Continue present medications.                        - You do NOT require further colon cancer screening                         measures (Annual stool testing (i.e. hemoccult, FIT,                         cologuard), sigmoidoscopy, colonoscopy or CT                         colonography). You should share this recommendation                         with your Primary Care provider.                        - To visualize the small bowel, perform video capsule                         endoscopy at appointment to be scheduled.                        - Follow up with Jamee Mazzoni, PA-C in the GI office.                         [phone]                        - Telephone GI office to schedule appointment.                        - The findings and recommendations were discussed with                         the patient. Procedure  Code(s):     --- Professional ---                        [phone], Colonoscopy, flexible; diagnostic, including                         collection of specimen(s) by brushing or washing, when                         performed (separate procedure) Diagnosis Code(s):     --- Professional ---                        K57.30, Diverticulosis of large intestine without                         perforation or abscess without bleeding                        D50.9, Iron deficiency anemia, unspecified                        K64.2, Third degree hemorrhoids CPT copyright 2022 American Medical Association. All rights reserved. The codes documented in this report are preliminary and upon coder review may  be revised to meet current compliance requirements. Cassie Click MD, MD 06/13/2023 1:20:57 PM This report has been signed electronically. Number of Addenda: 0 Note Initiated On: 06/13/2023 11:58 AM Scope Withdrawal Time: 0 hours 6 minutes 1 second  Total Procedure Duration: 0 hours 11 minutes 28 seconds  Estimated Blood Loss:  Estimated blood  loss: none. Estimated blood loss: none.      [facility]

## 2023-06-13 NOTE — Interval H&P Note (Signed)
 History and Physical Interval Note:  06/13/2023 12:38 PM  Jane Griffin  has presented today for surgery, with the diagnosis of D50.0 (ICD-10-CM) - Iron deficiency anemia due to chronic blood loss Z86.0101 (ICD-10-CM) - Hx of adenomatous colonic polyps.  The various methods of treatment have been discussed with the patient and family. After consideration of risks, benefits and other options for treatment, the patient has consented to  Procedure(s) with comments: COLONOSCOPY (N/A) - Patient on Plavix EGD (ESOPHAGOGASTRODUODENOSCOPY) (N/A) as a surgical intervention.  The patient's history has been reviewed, patient examined, no change in status, stable for surgery.  I have reviewed the patient's chart and labs.  Questions were answered to the patient's satisfaction.     [place], Jovon Streetman

## 2023-06-13 NOTE — Anesthesia Postprocedure Evaluation (Signed)
 Anesthesia Post Note  Patient: Jane Griffin  Procedure(s) Performed: COLONOSCOPY EGD (ESOPHAGOGASTRODUODENOSCOPY)  Patient location during evaluation: Endoscopy Anesthesia Type: General Level of consciousness: awake and alert Pain management: pain level controlled Vital Signs Assessment: post-procedure vital signs reviewed and stable Respiratory status: spontaneous breathing, nonlabored ventilation, respiratory function stable and patient connected to nasal cannula oxygen Cardiovascular status: blood pressure returned to baseline and stable Postop Assessment: no apparent nausea or vomiting Anesthetic complications: no   There were no known notable events for this encounter.   Last Vitals:  Vitals:   06/13/23 1317 06/13/23 1329  BP: 134/64 (!) 160/75  Pulse: 75 77  Resp: (!) 23 (!) 22  Temp: (!) 36.1 C   SpO2: 98% 96%    Last Pain:  Vitals:   06/13/23 1329  TempSrc:   PainSc: 0-No pain                 Nancey Awkward

## 2023-06-13 NOTE — Transfer of Care (Signed)
 Immediate Anesthesia Transfer of Care Note  Patient: Jane Griffin  Procedure(s) Performed: COLONOSCOPY EGD (ESOPHAGOGASTRODUODENOSCOPY)  Patient Location: Endoscopy Unit  Anesthesia Type:General  Level of Consciousness: sedated  Airway & Oxygen Therapy: Patient Spontanous Breathing  Post-op Assessment: Report given to RN and Post -op Vital signs reviewed and stable  Post vital signs: Reviewed and stable  Last Vitals:  Vitals Value Taken Time  BP 134/64 06/13/23 1318  Temp    Pulse 75 06/13/23 1318  Resp 23 06/13/23 1318  SpO2 98 % 06/13/23 1318  Vitals shown include unfiled device data.  Last Pain:  Vitals:   06/13/23 1152  TempSrc: Temporal  PainSc: 0-No pain         Complications: There were no known notable events for this encounter.

## 2023-06-13 NOTE — Anesthesia Preprocedure Evaluation (Signed)
 Anesthesia Evaluation  Patient identified by MRN, date of birth, ID band Patient awake    Reviewed: Allergy & Precautions, NPO status , Patient's Chart, lab work & pertinent test results  History of Anesthesia Complications (+) PONV and history of anesthetic complications  Airway Mallampati: II  TM Distance: >3 FB Neck ROM: full    Dental  (+) Teeth Intact, Dental Advidsory Given   Pulmonary shortness of breath (2/2 anemia), asthma , neg sleep apnea, neg COPD, neg recent URI   Pulmonary exam normal breath sounds clear to auscultation       Cardiovascular Exercise Tolerance: Good hypertension, Pt. on medications (-) angina + CAD and + Past MI  (-) Cardiac Stents Normal cardiovascular exam(-) dysrhythmias (-) Valvular Problems/Murmurs Rhythm:Regular     Neuro/Psych negative neurological ROS  negative psych ROS   GI/Hepatic negative GI ROS, Neg liver ROS,GERD  Medicated,,  Endo/Other  negative endocrine ROS    Renal/GU CRFRenal diseasenegative Renal ROS  negative genitourinary   Musculoskeletal   Abdominal Normal abdominal exam  (+)   Peds negative pediatric ROS (+)  Hematology  (+) Blood dyscrasia, anemia   Anesthesia Other Findings Past Medical History: No date: Allergic rhinitis No date: Arthritis No date: Cancer (HCC) No date: Cervical disc disease No date: Chronic kidney disease     Comment:  STAGE 3 No date: GERD (gastroesophageal reflux disease) No date: H/O blood clots     Comment:  around her 40's No date: History of colon polyps No date: Hyperlipidemia No date: Hypertension No date: PONV (postoperative nausea and vomiting)     Comment:  nausea No date: Shingles  Past Surgical History: No date: ABDOMINAL HYSTERECTOMY 10/26/2021: BREAST BIOPSY; Left     Comment:  [place] Core Bx ribbon clip path pending 2013: COLONOSCOPY 10/11/2016: COLONOSCOPY WITH PROPOFOL; N/A     Comment:  Procedure: COLONOSCOPY  WITH PROPOFOL;  Surgeon: Kieth Brightly, MD;  Location: [facility] ENDOSCOPY;  Service:               Endoscopy;  Laterality: N/A; No date: ELBOW SURGERY; Left No date: EYE SURGERY     Comment:  both eyes cataract removed 09/18/2015: FEMORAL HERNIA REPAIR; Right     Comment:  Procedure: HERNIA REPAIR femoral  ADULT;  Surgeon:               Kieth Brightly, MD;  Location: [facility] ORS;  Service:              General;  Laterality: Right; No date: HERNIA REPAIR 09/06/2015: LAPAROSCOPIC BILATERAL SALPINGO OOPHERECTOMY; Bilateral     Comment:  Procedure: LAPAROSCOPIC BILATERAL SALPINGO OOPHORECTOMY;              Surgeon: Suzy Bouchard, MD;  Location: [facility] ORS;               Service: Gynecology;  Laterality: Bilateral; No date: PARTIAL HYSTERECTOMY No date: TUBAL LIGATION No date: UPPER GI ENDOSCOPY No date: WRIST SURGERY; Right  BMI    Body Mass Index: 21.40 kg/m      Reproductive/Obstetrics negative OB ROS                             Anesthesia Physical Anesthesia Plan  ASA: 3  Anesthesia Plan: General   Post-op Pain Management:    Induction: Intravenous  PONV Risk Score and Plan:  4 or greater and Propofol infusion and TIVA  Airway Management Planned: Natural Airway and Nasal Cannula  Additional Equipment:   Intra-op Plan:   Post-operative Plan:   Informed Consent: I have reviewed the patients History and Physical, chart, labs and discussed the procedure including the risks, benefits and alternatives for the proposed anesthesia with the patient or authorized representative who has indicated his/her understanding and acceptance.     Dental Advisory Given  Plan Discussed with: CRNA and Surgeon  Anesthesia Plan Comments:         Anesthesia Quick Evaluation

## 2023-06-14 ENCOUNTER — Inpatient Hospital Stay: Attending: Oncology

## 2023-06-14 ENCOUNTER — Telehealth: Payer: Self-pay | Admitting: *Deleted

## 2023-06-14 DIAGNOSIS — Z79810 Long term (current) use of selective estrogen receptor modulators (SERMs): Secondary | ICD-10-CM | POA: Insufficient documentation

## 2023-06-14 DIAGNOSIS — Z86718 Personal history of other venous thrombosis and embolism: Secondary | ICD-10-CM | POA: Insufficient documentation

## 2023-06-14 DIAGNOSIS — Z7901 Long term (current) use of anticoagulants: Secondary | ICD-10-CM | POA: Diagnosis not present

## 2023-06-14 DIAGNOSIS — Z923 Personal history of irradiation: Secondary | ICD-10-CM | POA: Insufficient documentation

## 2023-06-14 DIAGNOSIS — M810 Age-related osteoporosis without current pathological fracture: Secondary | ICD-10-CM | POA: Diagnosis not present

## 2023-06-14 DIAGNOSIS — Z1732 Human epidermal growth factor receptor 2 negative status: Secondary | ICD-10-CM | POA: Diagnosis not present

## 2023-06-14 DIAGNOSIS — C50412 Malignant neoplasm of upper-outer quadrant of left female breast: Secondary | ICD-10-CM | POA: Diagnosis not present

## 2023-06-14 DIAGNOSIS — D509 Iron deficiency anemia, unspecified: Secondary | ICD-10-CM

## 2023-06-14 DIAGNOSIS — D649 Anemia, unspecified: Secondary | ICD-10-CM | POA: Insufficient documentation

## 2023-06-14 DIAGNOSIS — Z170 Estrogen receptor positive status [ER+]: Secondary | ICD-10-CM | POA: Diagnosis not present

## 2023-06-14 DIAGNOSIS — Z1721 Progesterone receptor positive status: Secondary | ICD-10-CM | POA: Diagnosis not present

## 2023-06-14 DIAGNOSIS — Z803 Family history of malignant neoplasm of breast: Secondary | ICD-10-CM | POA: Insufficient documentation

## 2023-06-14 LAB — CBC WITH DIFFERENTIAL/PLATELET
Abs Immature Granulocytes: 0.05 10*3/uL (ref 0.00–0.07)
Basophils Absolute: 0.1 10*3/uL (ref 0.0–0.1)
Basophils Relative: 1 %
Eosinophils Absolute: 0.1 10*3/uL (ref 0.0–0.5)
Eosinophils Relative: 2 %
HCT: 29.9 % — ABNORMAL LOW (ref 36.0–46.0)
Hemoglobin: 9.4 g/dL — ABNORMAL LOW (ref 12.0–15.0)
Immature Granulocytes: 1 %
Lymphocytes Relative: 22 %
Lymphs Abs: 1.2 10*3/uL (ref 0.7–4.0)
MCH: 27.6 pg (ref 26.0–34.0)
MCHC: 31.4 g/dL (ref 30.0–36.0)
MCV: 87.7 fL (ref 80.0–100.0)
Monocytes Absolute: 0.7 10*3/uL (ref 0.1–1.0)
Monocytes Relative: 12 %
Neutro Abs: 3.3 10*3/uL (ref 1.7–7.7)
Neutrophils Relative %: 62 %
Platelets: 217 10*3/uL (ref 150–400)
RBC: 3.41 MIL/uL — ABNORMAL LOW (ref 3.87–5.11)
RDW: 15.1 % (ref 11.5–15.5)
WBC: 5.4 10*3/uL (ref 4.0–10.5)
nRBC: 0 % (ref 0.0–0.2)

## 2023-06-14 LAB — SURGICAL PATHOLOGY

## 2023-06-14 LAB — SAMPLE TO BLOOD BANK

## 2023-06-14 NOTE — Telephone Encounter (Signed)
 RN placed call to patient to advise that based on lab results, patient does not need blood tomorrow. Patients appointment for tomorrow has been canceled, all other follow up as scheduled. Patient verbalized understanding and is in agreement with plan.

## 2023-06-15 ENCOUNTER — Inpatient Hospital Stay

## 2023-06-27 ENCOUNTER — Other Ambulatory Visit: Payer: Self-pay | Admitting: *Deleted

## 2023-06-27 DIAGNOSIS — D509 Iron deficiency anemia, unspecified: Secondary | ICD-10-CM

## 2023-06-28 ENCOUNTER — Inpatient Hospital Stay: Admitting: Oncology

## 2023-06-28 ENCOUNTER — Encounter: Payer: Self-pay | Admitting: Oncology

## 2023-06-28 ENCOUNTER — Inpatient Hospital Stay

## 2023-06-28 VITALS — BP 135/68 | HR 68 | Resp 20 | Ht 60.5 in | Wt 112.3 lb

## 2023-06-28 DIAGNOSIS — D509 Iron deficiency anemia, unspecified: Secondary | ICD-10-CM | POA: Diagnosis not present

## 2023-06-28 DIAGNOSIS — D649 Anemia, unspecified: Secondary | ICD-10-CM | POA: Diagnosis not present

## 2023-06-28 LAB — CBC WITH DIFFERENTIAL/PLATELET
Abs Immature Granulocytes: 0.03 10*3/uL (ref 0.00–0.07)
Basophils Absolute: 0.1 10*3/uL (ref 0.0–0.1)
Basophils Relative: 1 %
Eosinophils Absolute: 0.1 10*3/uL (ref 0.0–0.5)
Eosinophils Relative: 2 %
HCT: 29.6 % — ABNORMAL LOW (ref 36.0–46.0)
Hemoglobin: 9.3 g/dL — ABNORMAL LOW (ref 12.0–15.0)
Immature Granulocytes: 1 %
Lymphocytes Relative: 21 %
Lymphs Abs: 1.1 10*3/uL (ref 0.7–4.0)
MCH: 27 pg (ref 26.0–34.0)
MCHC: 31.4 g/dL (ref 30.0–36.0)
MCV: 86 fL (ref 80.0–100.0)
Monocytes Absolute: 0.6 10*3/uL (ref 0.1–1.0)
Monocytes Relative: 11 %
Neutro Abs: 3.5 10*3/uL (ref 1.7–7.7)
Neutrophils Relative %: 64 %
Platelets: 240 10*3/uL (ref 150–400)
RBC: 3.44 MIL/uL — ABNORMAL LOW (ref 3.87–5.11)
RDW: 14.6 % (ref 11.5–15.5)
WBC: 5.4 10*3/uL (ref 4.0–10.5)
nRBC: 0 % (ref 0.0–0.2)

## 2023-06-28 LAB — SAMPLE TO BLOOD BANK

## 2023-06-28 LAB — IRON AND TIBC
Iron: 36 ug/dL (ref 28–170)
Saturation Ratios: 9 % — ABNORMAL LOW (ref 10.4–31.8)
TIBC: 410 ug/dL (ref 250–450)
UIBC: 374 ug/dL

## 2023-06-28 LAB — FERRITIN: Ferritin: 9 ng/mL — ABNORMAL LOW (ref 11–307)

## 2023-06-28 NOTE — Progress Notes (Signed)
 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  Now with anemia and DVT.  INTERVAL HISTORY: Patient returns to clinic today for repeat laboratory work, further evaluation, consideration of IV Venofer.  She currently feels well and is asymptomatic.  She does not complain of any weakness or fatigue today. She has no neurologic complaints.  She denies any recent fevers or illnesses.  She has a good appetite and denies weight loss.  She has no chest pain, shortness of breath, cough, or hemoptysis.  She denies any nausea, vomiting, constipation, or diarrhea.  She has no further melena or hematochezia.  She has no urinary complaints.  Patient offers no specific complaints today.  REVIEW OF SYSTEMS:   Review of Systems  Constitutional: Negative.  Negative for fever, malaise/fatigue and weight loss.  Respiratory: Negative.  Negative for cough and shortness of breath.   Cardiovascular: Negative.  Negative for chest pain and leg swelling.  Gastrointestinal: Negative.  Negative for abdominal pain.  Genitourinary: Negative.  Negative for dysuria.  Musculoskeletal: Negative.  Negative for back pain and joint pain.  Skin: Negative.  Negative for rash.  Neurological: Negative.  Negative for dizziness, focal weakness, weakness and headaches.  Psychiatric/Behavioral: Negative.  The patient is not nervous/anxious.     As per HPI. Otherwise, a complete review of systems is  negative.  PAST MEDICAL HISTORY: Past Medical History:  Diagnosis Date   Allergic rhinitis    Arthritis    joints   Cancer (HCC)    breast  left   Cervical disc disease    Chronic kidney disease    STAGE 3   GERD (gastroesophageal reflux disease)    H/O blood clots    around her 20's   History of asthma    History of colon polyps    History of gout    Hyperlipidemia    Hypertension    Personal history of radiation therapy    PONV (postoperative nausea and vomiting)    nausea   Shingles     PAST SURGICAL HISTORY: Past Surgical History:  Procedure Laterality Date   ABDOMINAL HYSTERECTOMY     BREAST BIOPSY Left 10/26/2021   US  Core Bx ribbon clip positive   BREAST LUMPECTOMY Left 11/11/2021   Procedure: BREAST LUMPECTOMY;  Surgeon: Marshall Skeeter, MD;  Location: [facility] ORS;  Service: General;  Laterality: Left;   BREAST LUMPECTOMY Left 11/11/2021   invasive lobular ca/neg margins/closest margin .3 mm   COLONOSCOPY  2013   COLONOSCOPY N/A 06/13/2023   Procedure: COLONOSCOPY;  Surgeon: Toledo, Alphonsus Jeans, MD;  Location: [facility] ENDOSCOPY;  Service: Gastroenterology;  Laterality: N/A;  Patient on Plavix   COLONOSCOPY WITH PROPOFOL  N/A 10/11/2016   Procedure: COLONOSCOPY WITH PROPOFOL ;  Surgeon: Jerlean Mood, MD;  Location: [facility] ENDOSCOPY;  Service: Endoscopy;  Laterality: N/A;   DISTAL INTERPHALANGEAL JOINT FUSION Right 08/08/2022   Procedure: Right middle finger DISTAL INTERPHALANGEAL JOINT FUSION;  Surgeon: Molli Angelucci, MD;  Location: [facility];  Service: Orthopedics;  Laterality: Right;   ELBOW SURGERY Left  ESOPHAGOGASTRODUODENOSCOPY N/A 04/19/2023   Procedure: EGD (ESOPHAGOGASTRODUODENOSCOPY);  Surgeon: Toledo, Alphonsus Jeans, MD;  Location: [facility] ENDOSCOPY;  Service: Gastroenterology;  Laterality: N/A;   ESOPHAGOGASTRODUODENOSCOPY N/A 06/13/2023   Procedure: EGD (ESOPHAGOGASTRODUODENOSCOPY);  Surgeon: Toledo, Alphonsus Jeans, MD;  Location: [facility] ENDOSCOPY;  Service:  Gastroenterology;  Laterality: N/A;   EYE SURGERY     both eyes cataract removed   FEMORAL HERNIA REPAIR Right 09/18/2015   Procedure: HERNIA REPAIR femoral  ADULT;  Surgeon: Jerlean Mood, MD;  Location: [facility] ORS;  Service: General;  Laterality: Right;   HERNIA REPAIR     LAPAROSCOPIC BILATERAL SALPINGO OOPHERECTOMY Bilateral 09/06/2015   Procedure: LAPAROSCOPIC BILATERAL SALPINGO OOPHORECTOMY;  Surgeon: Carolynn Citrin, MD;  Location: [facility] ORS;  Service: Gynecology;  Laterality: Bilateral;   PARTIAL HYSTERECTOMY     TUBAL LIGATION     UPPER GI ENDOSCOPY     WRIST SURGERY Right     FAMILY HISTORY: Family History  Problem Relation Age of Onset   Clotting disorder Mother    Heart failure Mother    Emphysema Father    Heart failure Father    Cancer Daughter        breast cancer   Breast cancer Daughter 53    ADVANCED DIRECTIVES (Y/N):  N  HEALTH MAINTENANCE: Social History   Tobacco Use   Smoking status: Never   Smokeless tobacco: Never  Vaping Use   Vaping status: Never Used  Substance Use Topics   Alcohol use: No   Drug use: No     Colonoscopy:  PAP:  Bone density:  Lipid panel:  Allergies  Allergen Reactions   Codeine Nausea Only   Gabapentin      nightmares   Lipitor [Atorvastatin]     Aches    Pravachol  [Pravastatin  Sodium]     aches   Tramadol      nausea   Aleve [Naproxen Sodium] Rash   Sulfa Antibiotics Itching and Rash   Sulfasalazine Itching and Rash    Current Outpatient Medications  Medication Sig Dispense Refill   acetaminophen  (TYLENOL ) 500 MG tablet Take 500 mg by mouth every 6 (six) hours as needed for mild pain.     albuterol  (PROVENTIL ) (2.5 MG/3ML) 0.083% nebulizer solution Take 2.5 mg by nebulization every 6 (six) hours as needed for wheezing.     amLODipine (NORVASC) 5 MG tablet Take 5 mg by mouth.     apixaban  (ELIQUIS ) 5 MG TABS tablet Take 1 tablet (5 mg total) by mouth 2 (two) times daily. 60 tablet 2   colchicine  0.6 MG tablet PLEASE SEE ATTACHED FOR DETAILED DIRECTIONS     Febuxostat  80 MG TABS Take 0.5 tablets (40 mg total) by mouth daily.     fluticasone-salmeterol (ADVAIR) 100-50 MCG/ACT AEPB Inhale 1 puff into the lungs every 12 (twelve) hours.     fluvastatin XL (LESCOL XL) 80 MG 24 hr tablet Take 80 mg by mouth daily.  2   metoprolol  tartrate (LOPRESSOR ) 25 MG tablet Take 25 mg by mouth 2 (two) times daily.     pantoprazole  (PROTONIX ) 40 MG tablet TAKE 1 TABLET BY MOUTH ONCE A DAY 30-60 MINUTES BEFORE FIRST MEAL OF THE DAY 90 tablet 1   tamoxifen  (NOLVADEX ) 20 MG tablet TAKE 1 TABLET BY MOUTH EVERY DAY 90 tablet 1   [Paused] alendronate  (FOSAMAX ) 70 MG tablet TAKE 1 TABLET BY MOUTH ONCE A WEEK. TAKE WITH A FULL GLASS OF WATER ON AN EMPTY STOMACH. (Patient not taking: Reported on 06/28/2023) 12  tablet 3   No current facility-administered medications for this visit.    OBJECTIVE: Vitals:   06/28/23 1033  BP: 135/68  Pulse: 68  Resp: 20  SpO2: 100%      Body mass index is 21.57 kg/m.    ECOG FS:0 - Asymptomatic  General: Well-developed, well-nourished, no acute distress. Eyes: Pink conjunctiva, anicteric sclera. HEENT: Normocephalic, moist mucous membranes. Lungs: No audible wheezing or coughing. Heart: Regular rate and rhythm. Abdomen: Soft, nontender, no obvious distention. Musculoskeletal: No edema, cyanosis, or clubbing. Neuro: Alert, answering all questions appropriately. Cranial nerves grossly intact. Skin: No rashes or petechiae noted. Psych: Normal affect.  LAB RESULTS:  Lab Results  Component Value Date   NA 136 04/20/2023   K 4.5 04/20/2023   CL 102 04/20/2023   CO2 21 (L) 04/20/2023   GLUCOSE 86 04/20/2023   BUN 31 (H) 04/20/2023   CREATININE 2.20 (H) 04/20/2023   CALCIUM  8.3 (L) 04/20/2023   PROT 6.7 04/17/2023   ALBUMIN  3.5 04/17/2023   AST 25 04/17/2023   ALT 17 04/17/2023   ALKPHOS 38 04/17/2023   BILITOT 0.8 04/17/2023   GFRNONAA 22 (L) 04/20/2023    GFRAA 48 (L) 09/18/2015    Lab Results  Component Value Date   WBC 5.4 06/28/2023   NEUTROABS 3.5 06/28/2023   HGB 9.3 (L) 06/28/2023   HCT 29.6 (L) 06/28/2023   MCV 86.0 06/28/2023   PLT 240 06/28/2023   Lab Results  Component Value Date   IRON 36 06/28/2023   TIBC 410 06/28/2023   IRONPCTSAT 9 (L) 06/28/2023   Lab Results  Component Value Date   FERRITIN 9 (L) 06/28/2023     STUDIES: No results found.   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  MRI of the breast completed on November 04, 2021 did not reveal any additional malignancy.  Given the small size of the mass, she is considered a T1a therefore Oncotype testing nor chemotherapy was necessary.  Patient has now completed XRT.  Given her osteoporosis, patient was prescribed tamoxifen .  Patient had a DVT while taking tamoxifen , so this will likely need to be discontinued when she discontinues her Eliquis .  Repeat mammogram in December 2025 at which point will likely discontinue both Eliquis  and tamoxifen . Osteoporosis: Bone mineral density completed on January 06, 2022 revealed a T-score of -3.5.  Continue Fosamax , calcium , and vitamin D.  Continue tamoxifen  for now as above.  Repeat bone mineral density in December 2025 along with mammogram as above. DVT/possible PE: Patient has been instructed to continue Eliquis  as prescribed.  Will continue through December 2025 and discontinue as above.   Anemia: Secondary to possible GI bleed.  Patient's hemoglobin is decreased, but essentially stable at 9.3.  Iron stores remain reduced.  Proceed with 200 mg IV Venofer today.  Return to clinic next week for 2 additional doses.  Patient will then return to clinic in 2 months with repeat  laboratory work, further evaluation, and continuation of treatment if needed.   Possible GI bleed: Patient underwent colonoscopy and upper endoscopy on Jun 13, 2023 that did not reveal any significant pathology.  She reports GI plans to do a video endoscopy in the near future.   Hypertension: Blood pressure is within normal limits today.  Patient expressed understanding and was in agreement with this plan. She also understands that She can call clinic at any time with any questions, concerns, or complaints.    Cancer Staging  Invasive ductal carcinoma of left breast [facility]) Staging form: Breast, AJCC 8th Edition - Clinical stage from 11/04/2021: Stage IA (cT1a, cN0, cM0, G2, ER+, PR+, HER2-) - Signed by Shellie Dials, MD on 11/04/2021 Stage prefix: Initial diagnosis Histologic grading system: 3 grade system  Shellie Dials, MD   06/28/2023 4:08 PM

## 2023-06-29 ENCOUNTER — Inpatient Hospital Stay

## 2023-06-29 VITALS — BP 132/55 | HR 60 | Temp 97.9°F | Resp 19

## 2023-06-29 DIAGNOSIS — D509 Iron deficiency anemia, unspecified: Secondary | ICD-10-CM

## 2023-06-29 DIAGNOSIS — D649 Anemia, unspecified: Secondary | ICD-10-CM | POA: Diagnosis not present

## 2023-06-29 MED ORDER — IRON SUCROSE 20 MG/ML IV SOLN
200.0000 mg | Freq: Once | INTRAVENOUS | Status: AC
  Administered 2023-06-29: 200 mg via INTRAVENOUS

## 2023-06-29 NOTE — Patient Instructions (Signed)

## 2023-07-04 ENCOUNTER — Inpatient Hospital Stay

## 2023-07-04 VITALS — BP 128/82 | HR 68 | Temp 97.0°F | Resp 19

## 2023-07-04 DIAGNOSIS — D509 Iron deficiency anemia, unspecified: Secondary | ICD-10-CM

## 2023-07-04 DIAGNOSIS — D649 Anemia, unspecified: Secondary | ICD-10-CM | POA: Diagnosis not present

## 2023-07-04 MED ORDER — SODIUM CHLORIDE 0.9% FLUSH
10.0000 mL | Freq: Once | INTRAVENOUS | Status: AC | PRN
  Administered 2023-07-04: 10 mL
  Filled 2023-07-04: qty 10

## 2023-07-04 MED ORDER — IRON SUCROSE 20 MG/ML IV SOLN
200.0000 mg | Freq: Once | INTRAVENOUS | Status: AC
  Administered 2023-07-04: 200 mg via INTRAVENOUS

## 2023-07-06 ENCOUNTER — Inpatient Hospital Stay

## 2023-07-06 VITALS — BP 132/68 | HR 63 | Temp 97.7°F | Resp 18

## 2023-07-06 DIAGNOSIS — D649 Anemia, unspecified: Secondary | ICD-10-CM | POA: Diagnosis not present

## 2023-07-06 DIAGNOSIS — D509 Iron deficiency anemia, unspecified: Secondary | ICD-10-CM

## 2023-07-06 MED ORDER — IRON SUCROSE 20 MG/ML IV SOLN
200.0000 mg | Freq: Once | INTRAVENOUS | Status: AC
  Administered 2023-07-06: 200 mg via INTRAVENOUS
  Filled 2023-07-06: qty 10

## 2023-07-06 NOTE — Patient Instructions (Signed)

## 2023-08-09 ENCOUNTER — Other Ambulatory Visit: Payer: Self-pay | Admitting: Oncology

## 2023-08-16 DIAGNOSIS — J4521 Mild intermittent asthma with (acute) exacerbation: Secondary | ICD-10-CM | POA: Insufficient documentation

## 2023-08-27 ENCOUNTER — Other Ambulatory Visit: Payer: Self-pay

## 2023-08-27 DIAGNOSIS — D509 Iron deficiency anemia, unspecified: Secondary | ICD-10-CM

## 2023-08-28 ENCOUNTER — Inpatient Hospital Stay: Attending: Oncology

## 2023-08-28 DIAGNOSIS — D649 Anemia, unspecified: Secondary | ICD-10-CM | POA: Insufficient documentation

## 2023-08-28 DIAGNOSIS — D509 Iron deficiency anemia, unspecified: Secondary | ICD-10-CM

## 2023-08-28 LAB — CBC WITH DIFFERENTIAL (CANCER CENTER ONLY)
Abs Immature Granulocytes: 0.04 K/uL (ref 0.00–0.07)
Basophils Absolute: 0 K/uL (ref 0.0–0.1)
Basophils Relative: 1 %
Eosinophils Absolute: 0.2 K/uL (ref 0.0–0.5)
Eosinophils Relative: 3 %
HCT: 34.3 % — ABNORMAL LOW (ref 36.0–46.0)
Hemoglobin: 10.6 g/dL — ABNORMAL LOW (ref 12.0–15.0)
Immature Granulocytes: 1 %
Lymphocytes Relative: 15 %
Lymphs Abs: 1.1 K/uL (ref 0.7–4.0)
MCH: 28.4 pg (ref 26.0–34.0)
MCHC: 30.9 g/dL (ref 30.0–36.0)
MCV: 92 fL (ref 80.0–100.0)
Monocytes Absolute: 0.7 K/uL (ref 0.1–1.0)
Monocytes Relative: 9 %
Neutro Abs: 5.3 K/uL (ref 1.7–7.7)
Neutrophils Relative %: 71 %
Platelet Count: 175 K/uL (ref 150–400)
RBC: 3.73 MIL/uL — ABNORMAL LOW (ref 3.87–5.11)
RDW: 16.8 % — ABNORMAL HIGH (ref 11.5–15.5)
WBC Count: 7.3 K/uL (ref 4.0–10.5)
nRBC: 0 % (ref 0.0–0.2)

## 2023-08-28 LAB — SAMPLE TO BLOOD BANK

## 2023-08-29 ENCOUNTER — Ambulatory Visit: Admitting: Oncology

## 2023-08-29 ENCOUNTER — Ambulatory Visit

## 2023-09-05 ENCOUNTER — Telehealth: Payer: Self-pay | Admitting: Oncology

## 2023-09-05 ENCOUNTER — Inpatient Hospital Stay

## 2023-09-05 ENCOUNTER — Inpatient Hospital Stay: Admitting: Oncology

## 2023-09-05 ENCOUNTER — Encounter: Payer: Self-pay | Admitting: Oncology

## 2023-09-05 VITALS — BP 143/68 | HR 61

## 2023-09-05 VITALS — BP 133/80 | HR 58 | Temp 97.0°F | Resp 18 | Ht 60.0 in | Wt 108.0 lb

## 2023-09-05 DIAGNOSIS — D509 Iron deficiency anemia, unspecified: Secondary | ICD-10-CM

## 2023-09-05 DIAGNOSIS — D649 Anemia, unspecified: Secondary | ICD-10-CM | POA: Diagnosis not present

## 2023-09-05 DIAGNOSIS — C50912 Malignant neoplasm of unspecified site of left female breast: Secondary | ICD-10-CM | POA: Diagnosis not present

## 2023-09-05 MED ORDER — IRON SUCROSE 20 MG/ML IV SOLN
200.0000 mg | Freq: Once | INTRAVENOUS | Status: AC
  Administered 2023-09-05: 200 mg via INTRAVENOUS
  Filled 2023-09-05: qty 10

## 2023-09-05 MED ORDER — SODIUM CHLORIDE 0.9% FLUSH
10.0000 mL | Freq: Once | INTRAVENOUS | Status: AC | PRN
Start: 2023-09-05 — End: 2023-09-05
  Administered 2023-09-05: 10 mL
  Filled 2023-09-05: qty 10

## 2023-09-05 NOTE — Progress Notes (Signed)
 Patient tolerated Venofer  infusion well. Explained recommendation of 30 min post monitoring. Patient agreed to wait 15 min post monitoring. Educated on what signs to watch for & to call with any concerns. No questions, discharged. Stable

## 2023-09-05 NOTE — Patient Instructions (Signed)

## 2023-09-05 NOTE — Telephone Encounter (Signed)
 Needs mammogram January

## 2023-09-05 NOTE — Progress Notes (Unsigned)
 Patient is still having fatigue, she gives out so easy.

## 2023-09-05 NOTE — Progress Notes (Unsigned)
 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  Now with anemia and DVT.  INTERVAL HISTORY: Patient returns to clinic today for repeat laboratory work, further evaluation, consideration of additional IV Venofer .  She has noticed increased fatigue recently, but otherwise felt well.  She continues to tolerate tamoxifen  and Eliquis  without significant side effects.  She has no neurologic complaints.  She denies any recent fevers or illnesses.  She has a good appetite and denies weight loss.  She has no chest pain, shortness of breath, cough, or hemoptysis.  She denies any nausea, vomiting, constipation, or diarrhea.  She has no further melena or hematochezia.  She has no urinary complaints.  Patient offers no further specific complaints today.  REVIEW OF SYSTEMS:   Review of Systems  Constitutional:  Positive for malaise/fatigue. Negative for fever and weight loss.  Respiratory: Negative.  Negative for cough and shortness of breath.   Cardiovascular: Negative.  Negative for chest pain and leg swelling.  Gastrointestinal: Negative.  Negative for abdominal pain.  Genitourinary: Negative.  Negative for dysuria.  Musculoskeletal: Negative.  Negative for back pain and joint pain.  Skin: Negative.  Negative for rash.  Neurological: Negative.  Negative for dizziness, focal weakness, weakness and headaches.  Psychiatric/Behavioral: Negative.  The patient is not nervous/anxious.      As per HPI. Otherwise, a complete review of systems is negative.  PAST MEDICAL HISTORY: Past Medical History:  Diagnosis Date   Allergic rhinitis    Arthritis    joints   Cancer (HCC)    breast  left   Cervical disc disease    Chronic kidney disease    STAGE 3   GERD (gastroesophageal reflux disease)    H/O blood clots    around her 20's   History of asthma    History of colon polyps    History of gout    Hyperlipidemia    Hypertension    Personal history of radiation therapy    PONV (postoperative nausea and vomiting)    nausea   Shingles     PAST SURGICAL HISTORY: Past Surgical History:  Procedure Laterality Date   ABDOMINAL HYSTERECTOMY     BREAST BIOPSY Left 10/26/2021   US  Core Bx ribbon clip positive   BREAST LUMPECTOMY Left 11/11/2021   Procedure: BREAST LUMPECTOMY;  Surgeon: Dessa Reyes ORN, MD;  Location: [facility];  Service: General;  Laterality: Left;   BREAST LUMPECTOMY Left 11/11/2021   invasive lobular ca/neg margins/closest margin .3 mm   COLONOSCOPY  2013   COLONOSCOPY N/A 06/13/2023   Procedure: COLONOSCOPY;  Surgeon: Toledo, Ladell POUR, MD;  Location: [facility];  Service: Gastroenterology;  Laterality: N/A;  Patient on Plavix   COLONOSCOPY WITH PROPOFOL  N/A 10/11/2016   Procedure: COLONOSCOPY WITH PROPOFOL ;  Surgeon: Dellie Louanne MATSU, MD;  Location: [facility];  Service: Endoscopy;  Laterality: N/A;   DISTAL INTERPHALANGEAL JOINT FUSION Right 08/08/2022   Procedure: Right middle finger DISTAL INTERPHALANGEAL JOINT FUSION;  Surgeon: Kathlynn Sharper, MD;  Location: [facility];  Service: Orthopedics;  Laterality: Right;   ELBOW SURGERY Left    ESOPHAGOGASTRODUODENOSCOPY N/A 04/19/2023   Procedure: EGD (ESOPHAGOGASTRODUODENOSCOPY);  Surgeon: Toledo, Ladell POUR, MD;  Location: [facility];  Service: Gastroenterology;  Laterality: N/A;   ESOPHAGOGASTRODUODENOSCOPY N/A 06/13/2023   Procedure: EGD (ESOPHAGOGASTRODUODENOSCOPY);   Surgeon: Toledo, Ladell POUR, MD;  Location: [facility];  Service: Gastroenterology;  Laterality: N/A;   EYE SURGERY     both eyes cataract removed   FEMORAL HERNIA REPAIR Right 09/18/2015   Procedure: HERNIA REPAIR femoral  ADULT;  Surgeon: Louanne KANDICE Muse, MD;  Location: [facility];  Service: General;  Laterality: Right;   HERNIA REPAIR     LAPAROSCOPIC BILATERAL SALPINGO OOPHERECTOMY Bilateral 09/06/2015   Procedure: LAPAROSCOPIC BILATERAL SALPINGO OOPHORECTOMY;  Surgeon: Debby JINNY Dinsmore, MD;  Location: [facility];  Service: Gynecology;  Laterality: Bilateral;   PARTIAL HYSTERECTOMY     TUBAL LIGATION     UPPER GI ENDOSCOPY     WRIST SURGERY Right     FAMILY HISTORY: Family History  Problem Relation Age of Onset   Clotting disorder Mother    Heart failure Mother    Emphysema Father    Heart failure Father    Cancer Daughter        breast cancer   Breast cancer Daughter 19    ADVANCED DIRECTIVES (Y/N):  N  HEALTH MAINTENANCE: Social History   Tobacco Use   Smoking status: Never   Smokeless tobacco: Never  Vaping Use   Vaping status: Never Used  Substance Use Topics   Alcohol use: No   Drug use: No     Colonoscopy:  PAP:  Bone density:  Lipid panel:  Allergies  Allergen Reactions   Codeine Nausea Only   Gabapentin      nightmares   Lipitor [Atorvastatin]     Aches    Pravachol  [Pravastatin  Sodium]     aches   Tramadol      nausea   Aleve [Naproxen Sodium] Rash   Sulfa Antibiotics Itching and Rash   Sulfasalazine Itching and Rash    Current Outpatient Medications  Medication Sig Dispense Refill   acetaminophen  (TYLENOL ) 500 MG tablet Take 500 mg by mouth every 6 (six) hours as needed for mild pain.     albuterol  (PROVENTIL ) (2.5 MG/3ML) 0.083% nebulizer solution Take 2.5 mg by nebulization every 6 (six) hours as needed for wheezing.     amLODipine (NORVASC) 5 MG tablet Take 5 mg by mouth.     colchicine 0.6 MG tablet PLEASE SEE ATTACHED FOR  DETAILED DIRECTIONS     ELIQUIS  5 MG TABS tablet TAKE 1 TABLET BY MOUTH TWICE A DAY 60 tablet 2   Febuxostat  80 MG TABS Take 0.5 tablets (40 mg total) by mouth daily.     fluticasone-salmeterol (ADVAIR) 100-50 MCG/ACT AEPB Inhale 1 puff into the lungs every 12 (twelve) hours.     fluvastatin XL (LESCOL XL) 80 MG 24 hr tablet Take 80 mg by mouth daily.  2   metoprolol  tartrate (LOPRESSOR ) 25 MG tablet Take 25 mg by mouth 2 (two) times daily.     pantoprazole  (PROTONIX ) 40 MG tablet TAKE 1 TABLET BY MOUTH ONCE A DAY 30-60 MINUTES BEFORE FIRST MEAL OF THE DAY 90 tablet 1   tamoxifen  (NOLVADEX ) 20 MG tablet TAKE 1 TABLET BY MOUTH EVERY DAY 90 tablet 1   [Paused] alendronate  (FOSAMAX ) 70 MG tablet TAKE 1 TABLET BY MOUTH ONCE A WEEK. TAKE WITH A FULL GLASS OF WATER ON AN EMPTY STOMACH. (Patient not  taking: Reported on 09/05/2023) 12 tablet 3   No current facility-administered medications for this visit.    OBJECTIVE: Vitals:   09/05/23 1445  BP: 133/80  Pulse: (!) 58  Resp: 18  Temp: (!) 97 F (36.1 C)  SpO2: 100%      Body mass index is 21.09 kg/m.    ECOG FS:0 - Asymptomatic  General: Well-developed, well-nourished, no acute distress. Eyes: Pink conjunctiva, anicteric sclera. HEENT: Normocephalic, moist mucous membranes. Lungs: No audible wheezing or coughing. Heart: Regular rate and rhythm. Abdomen: Soft, nontender, no obvious distention. Musculoskeletal: No edema, cyanosis, or clubbing. Neuro: Alert, answering all questions appropriately. Cranial nerves grossly intact. Skin: No rashes or petechiae noted. Psych: Normal affect.  LAB RESULTS:  Lab Results  Component Value Date   NA 136 04/20/2023   K 4.5 04/20/2023   CL 102 04/20/2023   CO2 21 (L) 04/20/2023   GLUCOSE 86 04/20/2023   BUN 31 (H) 04/20/2023   CREATININE 2.20 (H) 04/20/2023   CALCIUM  8.3 (L) 04/20/2023   PROT 6.7 04/17/2023   ALBUMIN  3.5 04/17/2023   AST 25 04/17/2023   ALT 17 04/17/2023   ALKPHOS 38  04/17/2023   BILITOT 0.8 04/17/2023   GFRNONAA 22 (L) 04/20/2023   GFRAA 48 (L) 09/18/2015    Lab Results  Component Value Date   WBC 7.3 08/28/2023   NEUTROABS 5.3 08/28/2023   HGB 10.6 (L) 08/28/2023   HCT 34.3 (L) 08/28/2023   MCV 92.0 08/28/2023   PLT 175 08/28/2023   Lab Results  Component Value Date   IRON  36 06/28/2023   TIBC 410 06/28/2023   IRONPCTSAT 9 (L) 06/28/2023   Lab Results  Component Value Date   FERRITIN 9 (L) 06/28/2023     STUDIES: No results found.   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  MRI of the breast completed on November 04, 2021 did not reveal any additional malignancy.  Given the small size of the mass, she is considered a T1a therefore Oncotype testing nor chemotherapy was necessary.  Patient completed adjuvant XRT.  Given her osteoporosis, patient was prescribed tamoxifen .  Patient had a DVT while taking tamoxifen , so this will likely need to be discontinued when she discontinues her Eliquis .  Repeat mammogram in December 2025 with follow-up 2 to 3 days later at which point will likely discontinue both Eliquis  and tamoxifen . Osteoporosis: Bone mineral density completed on January 06, 2022 revealed a T-score of -3.5.  Continue Fosamax , calcium , and vitamin D.  Continue tamoxifen  for now as above.  Repeat bone mineral density in December 2025 along with mammogram as above. DVT/possible PE: Patient has been instructed to continue Eliquis  as prescribed.  Will continue through December 2025 and discontinue as above.   Anemia: Secondary to possible GI bleed.  Patient's hemoglobin continues to improve and is now 10.6.  Iron  stores were not drawn today.  Will proceed with 200 mg IV Venofer  today.  Patient  will return to clinic 1 additional time next week to receive additional treatment.  Follow-up in December as above.  Possible GI bleed: Patient underwent colonoscopy and upper endoscopy on Jun 13, 2023 that did not reveal any significant pathology.  She reports GI plans to do a video endoscopy in the near future.  Hypertension: Pressure continues to be within normal limits.  I spent a total of 30 minutes reviewing chart data, face-to-face evaluation with the patient, counseling and coordination of care as detailed above.    Patient expressed understanding and was in agreement with this plan. She also understands that She can call clinic at any time with any questions, concerns, or complaints.    Cancer Staging  Invasive ductal carcinoma of left breast [facility]) Staging form: Breast, AJCC 8th Edition - Clinical stage from 11/04/2021: Stage IA (cT1a, cN0, cM0, G2, ER+, PR+, HER2-) - Signed by Jacobo Evalene PARAS, MD on 11/04/2021 Stage prefix: Initial diagnosis Histologic grading system: 3 grade system  Evalene PARAS Jacobo, MD   09/07/2023 10:11 AM

## 2023-09-07 ENCOUNTER — Encounter: Payer: Self-pay | Admitting: Oncology

## 2023-09-10 ENCOUNTER — Telehealth: Payer: Self-pay | Admitting: *Deleted

## 2023-09-10 MED ORDER — TAMOXIFEN CITRATE 20 MG PO TABS: 20.0000 mg | ORAL_TABLET | Freq: Every day | ORAL | 1 refills | Status: AC

## 2023-09-10 NOTE — Telephone Encounter (Signed)
 Refill request for tamoxifen .  MD signed and sent to pharmacy.  Patient notified.

## 2023-09-11 ENCOUNTER — Inpatient Hospital Stay: Attending: Oncology

## 2023-09-11 VITALS — BP 147/62 | HR 65 | Temp 96.9°F | Resp 18

## 2023-09-11 DIAGNOSIS — D649 Anemia, unspecified: Secondary | ICD-10-CM | POA: Insufficient documentation

## 2023-09-11 DIAGNOSIS — Z79899 Other long term (current) drug therapy: Secondary | ICD-10-CM | POA: Diagnosis not present

## 2023-09-11 DIAGNOSIS — D509 Iron deficiency anemia, unspecified: Secondary | ICD-10-CM

## 2023-09-11 MED ORDER — IRON SUCROSE 20 MG/ML IV SOLN
200.0000 mg | Freq: Once | INTRAVENOUS | Status: AC
  Administered 2023-09-11: 200 mg via INTRAVENOUS
  Filled 2023-09-11: qty 10

## 2023-09-20 ENCOUNTER — Other Ambulatory Visit: Payer: Self-pay | Admitting: Internal Medicine

## 2023-09-20 DIAGNOSIS — R911 Solitary pulmonary nodule: Secondary | ICD-10-CM

## 2023-09-20 DIAGNOSIS — I824Y9 Acute embolism and thrombosis of unspecified deep veins of unspecified proximal lower extremity: Secondary | ICD-10-CM

## 2023-10-26 ENCOUNTER — Ambulatory Visit
Admission: RE | Admit: 2023-10-26 | Discharge: 2023-10-26 | Disposition: A | Discharge status: Home / Self Care | Source: Ambulatory Visit | Attending: Internal Medicine | Admitting: Internal Medicine

## 2023-10-26 DIAGNOSIS — I824Y9 Acute embolism and thrombosis of unspecified deep veins of unspecified proximal lower extremity: Secondary | ICD-10-CM | POA: Diagnosis present

## 2023-10-26 DIAGNOSIS — R911 Solitary pulmonary nodule: Secondary | ICD-10-CM | POA: Insufficient documentation

## 2023-10-31 ENCOUNTER — Other Ambulatory Visit
Admission: RE | Admit: 2023-10-31 | Discharge: 2023-10-31 | Disposition: A | Discharge status: Home / Self Care | Source: Ambulatory Visit | Attending: Internal Medicine | Admitting: Internal Medicine

## 2023-10-31 ENCOUNTER — Other Ambulatory Visit: Payer: Self-pay | Admitting: Oncology

## 2023-10-31 DIAGNOSIS — R1084 Generalized abdominal pain: Secondary | ICD-10-CM | POA: Insufficient documentation

## 2023-10-31 DIAGNOSIS — R197 Diarrhea, unspecified: Secondary | ICD-10-CM | POA: Insufficient documentation

## 2023-10-31 LAB — C DIFFICILE QUICK SCREEN W PCR REFLEX
C Diff antigen: NEGATIVE
C Diff interpretation: NOT DETECTED
C Diff toxin: NEGATIVE

## 2023-11-01 ENCOUNTER — Encounter: Payer: Self-pay | Admitting: Oncology

## 2023-11-01 ENCOUNTER — Telehealth: Payer: Self-pay | Admitting: Oncology

## 2023-11-01 NOTE — Telephone Encounter (Signed)
 Pt calling and states Dr. Lenon said her blood count has dropped and he suggested she see Dr. Jacobo to get iron  infusions. [phone]

## 2023-11-02 ENCOUNTER — Inpatient Hospital Stay: Attending: Oncology

## 2023-11-02 ENCOUNTER — Telehealth: Payer: Self-pay | Admitting: *Deleted

## 2023-11-02 DIAGNOSIS — Z170 Estrogen receptor positive status [ER+]: Secondary | ICD-10-CM | POA: Insufficient documentation

## 2023-11-02 DIAGNOSIS — D509 Iron deficiency anemia, unspecified: Secondary | ICD-10-CM

## 2023-11-02 DIAGNOSIS — D649 Anemia, unspecified: Secondary | ICD-10-CM | POA: Diagnosis present

## 2023-11-02 DIAGNOSIS — Z1732 Human epidermal growth factor receptor 2 negative status: Secondary | ICD-10-CM | POA: Insufficient documentation

## 2023-11-02 DIAGNOSIS — C50412 Malignant neoplasm of upper-outer quadrant of left female breast: Secondary | ICD-10-CM | POA: Diagnosis not present

## 2023-11-02 DIAGNOSIS — Z86718 Personal history of other venous thrombosis and embolism: Secondary | ICD-10-CM | POA: Diagnosis not present

## 2023-11-02 DIAGNOSIS — Z79810 Long term (current) use of selective estrogen receptor modulators (SERMs): Secondary | ICD-10-CM | POA: Insufficient documentation

## 2023-11-02 DIAGNOSIS — Z7901 Long term (current) use of anticoagulants: Secondary | ICD-10-CM | POA: Insufficient documentation

## 2023-11-02 DIAGNOSIS — Z1721 Progesterone receptor positive status: Secondary | ICD-10-CM | POA: Diagnosis not present

## 2023-11-02 LAB — IRON AND TIBC
Iron: 46 ug/dL (ref 28–170)
Saturation Ratios: 16 % (ref 10.4–31.8)
TIBC: 293 ug/dL (ref 250–450)
UIBC: 247 ug/dL

## 2023-11-02 LAB — CBC WITH DIFFERENTIAL/PLATELET
Abs Immature Granulocytes: 0.02 K/uL (ref 0.00–0.07)
Basophils Absolute: 0 K/uL (ref 0.0–0.1)
Basophils Relative: 1 %
Eosinophils Absolute: 0.1 K/uL (ref 0.0–0.5)
Eosinophils Relative: 2 %
HCT: 27.4 % — ABNORMAL LOW (ref 36.0–46.0)
Hemoglobin: 8.6 g/dL — ABNORMAL LOW (ref 12.0–15.0)
Immature Granulocytes: 0 %
Lymphocytes Relative: 29 %
Lymphs Abs: 1.5 K/uL (ref 0.7–4.0)
MCH: 30.9 pg (ref 26.0–34.0)
MCHC: 31.4 g/dL (ref 30.0–36.0)
MCV: 98.6 fL (ref 80.0–100.0)
Monocytes Absolute: 0.5 K/uL (ref 0.1–1.0)
Monocytes Relative: 11 %
Neutro Abs: 2.9 K/uL (ref 1.7–7.7)
Neutrophils Relative %: 57 %
Platelets: 166 K/uL (ref 150–400)
RBC: 2.78 MIL/uL — ABNORMAL LOW (ref 3.87–5.11)
RDW: 13.6 % (ref 11.5–15.5)
WBC: 5.1 K/uL (ref 4.0–10.5)
nRBC: 0 % (ref 0.0–0.2)

## 2023-11-02 LAB — SAMPLE TO BLOOD BANK

## 2023-11-02 LAB — FERRITIN: Ferritin: 77 ng/mL (ref 11–307)

## 2023-11-02 NOTE — Telephone Encounter (Signed)
 RN placed call to patient regarding lab results. Per Dr. Jacobo patients hgb is trending down, 8.6 today. Patients iron  and ferritin are within normal limits, Dr. Jacobo does not recommend IV iron  at this time but would like to repeat labs and have patient follow up in 4 weeks. Patient verbalized understanding and is in agreement. Scheduling message sent for 4 weeks: lab only on day 1; see Dr. Jacobo with Venofer  on day2.

## 2023-11-26 DIAGNOSIS — J479 Bronchiectasis, uncomplicated: Secondary | ICD-10-CM | POA: Insufficient documentation

## 2023-11-30 ENCOUNTER — Other Ambulatory Visit: Payer: Self-pay

## 2023-11-30 DIAGNOSIS — D509 Iron deficiency anemia, unspecified: Secondary | ICD-10-CM

## 2023-12-03 ENCOUNTER — Inpatient Hospital Stay: Attending: Oncology

## 2023-12-03 DIAGNOSIS — D509 Iron deficiency anemia, unspecified: Secondary | ICD-10-CM | POA: Diagnosis present

## 2023-12-03 LAB — CBC WITH DIFFERENTIAL/PLATELET
Abs Immature Granulocytes: 0.02 K/uL (ref 0.00–0.07)
Basophils Absolute: 0 K/uL (ref 0.0–0.1)
Basophils Relative: 1 %
Eosinophils Absolute: 0.1 K/uL (ref 0.0–0.5)
Eosinophils Relative: 1 %
HCT: 27.8 % — ABNORMAL LOW (ref 36.0–46.0)
Hemoglobin: 8.6 g/dL — ABNORMAL LOW (ref 12.0–15.0)
Immature Granulocytes: 0 %
Lymphocytes Relative: 24 %
Lymphs Abs: 1.5 K/uL (ref 0.7–4.0)
MCH: 29.8 pg (ref 26.0–34.0)
MCHC: 30.9 g/dL (ref 30.0–36.0)
MCV: 96.2 fL (ref 80.0–100.0)
Monocytes Absolute: 0.6 K/uL (ref 0.1–1.0)
Monocytes Relative: 9 %
Neutro Abs: 4.1 K/uL (ref 1.7–7.7)
Neutrophils Relative %: 65 %
Platelets: 241 K/uL (ref 150–400)
RBC: 2.89 MIL/uL — ABNORMAL LOW (ref 3.87–5.11)
RDW: 12.9 % (ref 11.5–15.5)
WBC: 6.3 K/uL (ref 4.0–10.5)
nRBC: 0 % (ref 0.0–0.2)

## 2023-12-03 LAB — FERRITIN: Ferritin: 24 ng/mL (ref 11–307)

## 2023-12-03 LAB — IRON AND TIBC
Iron: 24 ug/dL — ABNORMAL LOW (ref 28–170)
Saturation Ratios: 6 % — ABNORMAL LOW (ref 10.4–31.8)
TIBC: 403 ug/dL (ref 250–450)
UIBC: 379 ug/dL

## 2023-12-04 ENCOUNTER — Inpatient Hospital Stay

## 2023-12-04 ENCOUNTER — Inpatient Hospital Stay: Admitting: Oncology

## 2023-12-04 ENCOUNTER — Encounter: Payer: Self-pay | Admitting: Oncology

## 2023-12-04 VITALS — BP 135/71 | HR 82 | Resp 16

## 2023-12-04 VITALS — BP 115/82 | HR 63 | Temp 97.6°F | Resp 18 | Ht 60.0 in | Wt 111.0 lb

## 2023-12-04 DIAGNOSIS — D509 Iron deficiency anemia, unspecified: Secondary | ICD-10-CM

## 2023-12-04 MED ORDER — IRON SUCROSE 20 MG/ML IV SOLN
200.0000 mg | Freq: Once | INTRAVENOUS | Status: AC
  Administered 2023-12-04: 200 mg via INTRAVENOUS

## 2023-12-04 NOTE — Progress Notes (Signed)
 Patient tolerated venofer  with no complaints voiced.  Peripheral IV site clean and dry with good blood return noted before and after infusion. Pt observed for 30 minutes post iron  without any complications. VSS with discharge and left in satisfactory condition with no s/s of distress noted. All follow ups as scheduled.   Sparkle Aube

## 2023-12-04 NOTE — Patient Instructions (Signed)

## 2023-12-04 NOTE — Progress Notes (Signed)
 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  Now with anemia and DVT.  INTERVAL HISTORY: Patient returns to clinic today for repeat laboratory, further evaluation, and consideration of additional IV Venofer .  She admits to increased fatigue, but otherwise feels well. She continues to tolerate tamoxifen  and Eliquis  without significant side effects.  She has no neurologic complaints.  She denies any recent fevers or illnesses.  She has a good appetite and denies weight loss.  She has no chest pain, shortness of breath, cough, or hemoptysis.  She denies any nausea, vomiting, constipation, or diarrhea.  She has no further melena or hematochezia.  She has no urinary complaints.  Patient offers no further specific complaints today.  REVIEW OF SYSTEMS:   Review of Systems  Constitutional:  Positive for malaise/fatigue. Negative for fever and weight loss.  Respiratory: Negative.  Negative for cough and shortness of breath.   Cardiovascular: Negative.  Negative for chest pain and leg swelling.  Gastrointestinal: Negative.  Negative for abdominal pain.  Genitourinary: Negative.  Negative for dysuria.  Musculoskeletal: Negative.  Negative for back pain and joint pain.  Skin: Negative.  Negative for rash.  Neurological: Negative.  Negative for dizziness, focal weakness, weakness and headaches.  Psychiatric/Behavioral: Negative.  The patient is not nervous/anxious.     As per HPI.  Otherwise, a complete review of systems is negative.  PAST MEDICAL HISTORY: Past Medical History:  Diagnosis Date   Allergic rhinitis    Arthritis    joints   Cancer (HCC)    breast  left   Cervical disc disease    Chronic kidney disease    STAGE 3   GERD (gastroesophageal reflux disease)    H/O blood clots    around her 20's   History of asthma    History of colon polyps    History of gout    Hyperlipidemia    Hypertension    Personal history of radiation therapy    PONV (postoperative nausea and vomiting)    nausea   Shingles     PAST SURGICAL HISTORY: Past Surgical History:  Procedure Laterality Date   ABDOMINAL HYSTERECTOMY     BREAST BIOPSY Left 10/26/2021   US  Core Bx ribbon clip positive   BREAST LUMPECTOMY Left 11/11/2021   Procedure: BREAST LUMPECTOMY;  Surgeon: Dessa Reyes ORN, MD;  Location: [facility];  Service: General;  Laterality: Left;   BREAST LUMPECTOMY Left 11/11/2021   invasive lobular ca/neg margins/closest margin .3 mm   COLONOSCOPY  2013   COLONOSCOPY N/A 06/13/2023   Procedure: COLONOSCOPY;  Surgeon: Toledo, Ladell POUR, MD;  Location: [facility];  Service: Gastroenterology;  Laterality: N/A;  Patient on Plavix   COLONOSCOPY WITH PROPOFOL  N/A 10/11/2016   Procedure: COLONOSCOPY WITH PROPOFOL ;  Surgeon: Dellie Louanne MATSU, MD;  Location: [facility];  Service: Endoscopy;  Laterality: N/A;   DISTAL INTERPHALANGEAL JOINT FUSION Right 08/08/2022   Procedure: Right middle finger DISTAL INTERPHALANGEAL JOINT FUSION;  Surgeon: Kathlynn Sharper, MD;  Location: [facility];  Service: Orthopedics;  Laterality:  Right;   ELBOW SURGERY Left    ESOPHAGOGASTRODUODENOSCOPY N/A 04/19/2023   Procedure: EGD (ESOPHAGOGASTRODUODENOSCOPY);  Surgeon: Toledo, Ladell POUR, MD;  Location: [facility];  Service: Gastroenterology;  Laterality: N/A;   ESOPHAGOGASTRODUODENOSCOPY N/A 06/13/2023   Procedure: EGD (ESOPHAGOGASTRODUODENOSCOPY);  Surgeon: Toledo, Ladell POUR, MD;  Location: [facility];  Service: Gastroenterology;  Laterality: N/A;   EYE SURGERY     both eyes cataract removed   FEMORAL HERNIA REPAIR Right 09/18/2015   Procedure: HERNIA REPAIR femoral  ADULT;  Surgeon: Louanne KANDICE Muse, MD;  Location: [facility];  Service: General;  Laterality: Right;   HERNIA REPAIR     LAPAROSCOPIC BILATERAL SALPINGO OOPHERECTOMY Bilateral 09/06/2015   Procedure: LAPAROSCOPIC BILATERAL SALPINGO OOPHORECTOMY;  Surgeon: Debby JINNY Dinsmore, MD;  Location: [facility];  Service: Gynecology;  Laterality: Bilateral;   PARTIAL HYSTERECTOMY     TUBAL LIGATION     UPPER GI ENDOSCOPY     WRIST SURGERY Right     FAMILY HISTORY: Family History  Problem Relation Age of Onset   Clotting disorder Mother    Heart failure Mother    Emphysema Father    Heart failure Father    Cancer Daughter        breast cancer   Breast cancer Daughter 9    ADVANCED DIRECTIVES (Y/N):  N  HEALTH MAINTENANCE: Social History   Tobacco Use   Smoking status: Never   Smokeless tobacco: Never  Vaping Use   Vaping status: Never Used  Substance Use Topics   Alcohol use: No   Drug use: No     Colonoscopy:  PAP:  Bone density:  Lipid panel:  Allergies  Allergen Reactions   Codeine Nausea Only   Gabapentin      nightmares   Lipitor [Atorvastatin]     Aches    Pravachol  [Pravastatin  Sodium]     aches   Tramadol      nausea   Aleve [Naproxen Sodium] Rash   Sulfa Antibiotics Itching and Rash   Sulfasalazine Itching and Rash    Current Outpatient Medications  Medication Sig Dispense Refill   acetaminophen  (TYLENOL ) 500 MG tablet Take 500 mg by mouth every 6 (six) hours as needed for mild pain.     amLODipine (NORVASC) 5 MG tablet Take 5 mg by mouth.     colchicine 0.6 MG tablet PLEASE SEE ATTACHED FOR DETAILED DIRECTIONS     ELIQUIS  5 MG TABS tablet TAKE 1 TABLET BY MOUTH TWICE A DAY 60 tablet 2   Febuxostat  80 MG TABS Take 0.5 tablets (40 mg total) by mouth  daily.     fluticasone-salmeterol (ADVAIR) 100-50 MCG/ACT AEPB Inhale 1 puff into the lungs every 12 (twelve) hours.     JARDIANCE 10 MG TABS tablet Take 10 mg by mouth every morning.     metoprolol  tartrate (LOPRESSOR ) 25 MG tablet Take 25 mg by mouth 2 (two) times daily.     olmesartan-hydrochlorothiazide (BENICAR HCT) 20-12.5 MG tablet Take 1 tablet by mouth daily.     pantoprazole  (PROTONIX ) 40 MG tablet TAKE 1 TABLET BY MOUTH ONCE A DAY 30-60 MINUTES BEFORE FIRST MEAL OF THE DAY 90 tablet 1   tamoxifen  (NOLVADEX ) 20 MG tablet Take 1 tablet (20 mg total) by mouth daily. 90 tablet 1   albuterol  (PROVENTIL ) (2.5 MG/3ML) 0.083% nebulizer solution Take 2.5 mg by nebulization every 6 (six) hours as needed for wheezing. (Patient not taking: Reported on 12/04/2023)     [Paused] alendronate  (FOSAMAX ) 70 MG tablet TAKE  1 TABLET BY MOUTH ONCE A WEEK. TAKE WITH A FULL GLASS OF WATER ON AN EMPTY STOMACH. (Patient not taking: Reported on 12/04/2023) 12 tablet 3   fluvastatin XL (LESCOL XL) 80 MG 24 hr tablet Take 80 mg by mouth daily. (Patient not taking: Reported on 12/04/2023)  2   No current facility-administered medications for this visit.    OBJECTIVE: Vitals:   12/04/23 1315  BP: 115/82  Pulse: 63  Resp: 18  Temp: 97.6 F (36.4 C)  SpO2: 100%      Body mass index is 21.68 kg/m.    ECOG FS:0 - Asymptomatic  General: Well-developed, well-nourished, no acute distress. Eyes: Pink conjunctiva, anicteric sclera. HEENT: Normocephalic, moist mucous membranes. Lungs: No audible wheezing or coughing. Heart: Regular rate and rhythm. Abdomen: Soft, nontender, no obvious distention. Musculoskeletal: No edema, cyanosis, or clubbing. Neuro: Alert, answering all questions appropriately. Cranial nerves grossly intact. Skin: No rashes or petechiae noted. Psych: Normal affect.  LAB RESULTS:  Lab Results  Component Value Date   NA 136 04/20/2023   K 4.5 04/20/2023   CL 102 04/20/2023   CO2 21  (L) 04/20/2023   GLUCOSE 86 04/20/2023   BUN 31 (H) 04/20/2023   CREATININE 2.20 (H) 04/20/2023   CALCIUM  8.3 (L) 04/20/2023   PROT 6.7 04/17/2023   ALBUMIN  3.5 04/17/2023   AST 25 04/17/2023   ALT 17 04/17/2023   ALKPHOS 38 04/17/2023   BILITOT 0.8 04/17/2023   GFRNONAA 22 (L) 04/20/2023   GFRAA 48 (L) 09/18/2015    Lab Results  Component Value Date   WBC 6.3 12/03/2023   NEUTROABS 4.1 12/03/2023   HGB 8.6 (L) 12/03/2023   HCT 27.8 (L) 12/03/2023   MCV 96.2 12/03/2023   PLT 241 12/03/2023   Lab Results  Component Value Date   IRON  24 (L) 12/03/2023   TIBC 403 12/03/2023   IRONPCTSAT 6 (L) 12/03/2023   Lab Results  Component Value Date   FERRITIN 24 12/03/2023     STUDIES: No results found.   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  MRI of the breast completed on November 04, 2021 did not reveal any additional malignancy.  Given the small size of the mass, she is considered a T1a therefore Oncotype testing nor chemotherapy was necessary.  Patient completed adjuvant XRT.  Given her osteoporosis, patient was prescribed tamoxifen .  Patient had a DVT while taking tamoxifen , so this will likely need to be discontinued when she discontinues her Eliquis .  Repeat mammogram in December 2025.  Will follow-up at the end of December at which point will likely discontinue both Eliquis  and tamoxifen . Osteoporosis: Bone mineral density completed on January 06, 2022 revealed a T-score of -3.5.  Continue Fosamax , calcium , and vitamin D.  Continue tamoxifen  for now as above.  Repeat bone mineral density in December 2025 along with mammogram as above. DVT/possible PE: Patient has been instructed to continue Eliquis  as prescribed.  Will continue  through December 2025 and discontinue as above.   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  Patient's hemoglobin and iron  stores have trended down and she is mildly symptomatic.  Proceed with 200 mg IV Venofer  today.  Return to clinic 4 times over the next 2 weeks to receive additional  treatments.  Patient reports she has a GI appointment on January 16, 2024 to possibly discuss capsule endoscopy.  Return to clinic in December as above for repeat laboratories and consideration of additional iron .   Possible GI bleed: Patient underwent colonoscopy and upper endoscopy on Jun 13, 2023 that did not reveal any significant pathology.  Appointment with GI as above.     Patient expressed understanding and was in agreement with this plan. She also understands that She can call clinic at any time with any questions, concerns, or complaints.    Cancer Staging  Invasive ductal carcinoma of left breast [facility]) Staging form: Breast, AJCC 8th Edition - Clinical stage from 11/04/2021: Stage IA (cT1a, cN0, cM0, G2, ER+, PR+, HER2-) - Signed by Jacobo Evalene PARAS, MD on 11/04/2021 Stage prefix: Initial diagnosis Histologic grading system: 3 grade system  Evalene PARAS Jacobo, MD   12/04/2023 1:24 PM

## 2023-12-11 ENCOUNTER — Inpatient Hospital Stay: Attending: Oncology

## 2023-12-11 VITALS — BP 115/71 | HR 71 | Temp 97.5°F | Resp 18

## 2023-12-11 DIAGNOSIS — D509 Iron deficiency anemia, unspecified: Secondary | ICD-10-CM

## 2023-12-11 MED ORDER — IRON SUCROSE 20 MG/ML IV SOLN
200.0000 mg | Freq: Once | INTRAVENOUS | Status: AC
  Administered 2023-12-11: 200 mg via INTRAVENOUS
  Filled 2023-12-11: qty 10

## 2023-12-11 NOTE — Patient Instructions (Signed)

## 2023-12-13 ENCOUNTER — Inpatient Hospital Stay

## 2023-12-13 VITALS — BP 115/62 | HR 71 | Temp 99.1°F | Resp 16

## 2023-12-13 DIAGNOSIS — D509 Iron deficiency anemia, unspecified: Secondary | ICD-10-CM

## 2023-12-13 MED ORDER — IRON SUCROSE 20 MG/ML IV SOLN
200.0000 mg | Freq: Once | INTRAVENOUS | Status: AC
  Administered 2023-12-13: 200 mg via INTRAVENOUS
  Filled 2023-12-13: qty 10

## 2023-12-13 NOTE — Patient Instructions (Signed)

## 2023-12-17 ENCOUNTER — Inpatient Hospital Stay

## 2023-12-17 VITALS — BP 128/66 | HR 60 | Temp 96.8°F | Resp 18

## 2023-12-17 DIAGNOSIS — D509 Iron deficiency anemia, unspecified: Secondary | ICD-10-CM

## 2023-12-17 MED ORDER — SODIUM CHLORIDE 0.9% FLUSH
10.0000 mL | Freq: Once | INTRAVENOUS | Status: AC | PRN
  Administered 2023-12-17: 10 mL
  Filled 2023-12-17: qty 10

## 2023-12-17 MED ORDER — IRON SUCROSE 20 MG/ML IV SOLN
200.0000 mg | Freq: Once | INTRAVENOUS | Status: AC
  Administered 2023-12-17: 200 mg via INTRAVENOUS
  Filled 2023-12-17: qty 10

## 2023-12-19 ENCOUNTER — Inpatient Hospital Stay

## 2023-12-19 VITALS — BP 124/57 | HR 62 | Temp 98.9°F | Resp 18

## 2023-12-19 DIAGNOSIS — D509 Iron deficiency anemia, unspecified: Secondary | ICD-10-CM

## 2023-12-19 MED ORDER — SODIUM CHLORIDE 0.9% FLUSH
10.0000 mL | Freq: Once | INTRAVENOUS | Status: AC | PRN
  Administered 2023-12-19: 10 mL
  Filled 2023-12-19: qty 10

## 2023-12-19 MED ORDER — IRON SUCROSE 20 MG/ML IV SOLN
200.0000 mg | Freq: Once | INTRAVENOUS | Status: AC
  Administered 2023-12-19: 200 mg via INTRAVENOUS

## 2024-01-02 ENCOUNTER — Encounter: Payer: Self-pay | Admitting: Gastroenterology

## 2024-01-23 ENCOUNTER — Other Ambulatory Visit: Payer: Self-pay | Admitting: Oncology

## 2024-01-24 ENCOUNTER — Telehealth: Payer: Self-pay | Admitting: *Deleted

## 2024-01-24 NOTE — Telephone Encounter (Signed)
 Patient called to clarify why her Eliquis  rx script was denied by Dr. Jacobo. I called her back and let her know that per Dr. Jacobo, Patient should be finishing her 6 months of anticoagulation and does not need refills. She gave verbal understanding and thanked me for calling her back.

## 2024-01-28 DIAGNOSIS — N2581 Secondary hyperparathyroidism of renal origin: Secondary | ICD-10-CM | POA: Insufficient documentation

## 2024-02-04 ENCOUNTER — Inpatient Hospital Stay: Attending: Oncology

## 2024-02-04 DIAGNOSIS — Z79810 Long term (current) use of selective estrogen receptor modulators (SERMs): Secondary | ICD-10-CM | POA: Diagnosis not present

## 2024-02-04 DIAGNOSIS — Z1721 Progesterone receptor positive status: Secondary | ICD-10-CM | POA: Insufficient documentation

## 2024-02-04 DIAGNOSIS — Z1732 Human epidermal growth factor receptor 2 negative status: Secondary | ICD-10-CM | POA: Insufficient documentation

## 2024-02-04 DIAGNOSIS — C50412 Malignant neoplasm of upper-outer quadrant of left female breast: Secondary | ICD-10-CM | POA: Diagnosis not present

## 2024-02-04 DIAGNOSIS — Z170 Estrogen receptor positive status [ER+]: Secondary | ICD-10-CM | POA: Diagnosis not present

## 2024-02-04 DIAGNOSIS — D509 Iron deficiency anemia, unspecified: Secondary | ICD-10-CM | POA: Diagnosis present

## 2024-02-04 LAB — CBC WITH DIFFERENTIAL/PLATELET
Abs Immature Granulocytes: 0.07 K/uL (ref 0.00–0.07)
Basophils Absolute: 0 K/uL (ref 0.0–0.1)
Basophils Relative: 1 %
Eosinophils Absolute: 0.1 K/uL (ref 0.0–0.5)
Eosinophils Relative: 1 %
HCT: 41.1 % (ref 36.0–46.0)
Hemoglobin: 13.2 g/dL (ref 12.0–15.0)
Immature Granulocytes: 1 %
Lymphocytes Relative: 18 %
Lymphs Abs: 1.1 K/uL (ref 0.7–4.0)
MCH: 30.1 pg (ref 26.0–34.0)
MCHC: 32.1 g/dL (ref 30.0–36.0)
MCV: 93.6 fL (ref 80.0–100.0)
Monocytes Absolute: 0.5 K/uL (ref 0.1–1.0)
Monocytes Relative: 8 %
Neutro Abs: 4.4 K/uL (ref 1.7–7.7)
Neutrophils Relative %: 71 %
Platelets: 177 K/uL (ref 150–400)
RBC: 4.39 MIL/uL (ref 3.87–5.11)
RDW: 14.2 % (ref 11.5–15.5)
WBC: 6.2 K/uL (ref 4.0–10.5)
nRBC: 0 % (ref 0.0–0.2)

## 2024-02-04 LAB — FERRITIN: Ferritin: 438 ng/mL — ABNORMAL HIGH (ref 11–307)

## 2024-02-04 LAB — IRON AND TIBC
Iron: 68 ug/dL (ref 28–170)
Saturation Ratios: 22 % (ref 10.4–31.8)
TIBC: 307 ug/dL (ref 250–450)
UIBC: 239 ug/dL

## 2024-02-04 LAB — SAMPLE TO BLOOD BANK

## 2024-02-05 ENCOUNTER — Inpatient Hospital Stay: Admitting: Oncology

## 2024-02-05 ENCOUNTER — Inpatient Hospital Stay

## 2024-02-11 ENCOUNTER — Other Ambulatory Visit

## 2024-02-12 ENCOUNTER — Inpatient Hospital Stay

## 2024-02-12 ENCOUNTER — Ambulatory Visit
Admission: RE | Admit: 2024-02-12 | Discharge: 2024-02-12 | Disposition: A | Discharge status: Home / Self Care | Source: Ambulatory Visit | Attending: Oncology | Admitting: Oncology

## 2024-02-12 DIAGNOSIS — Z853 Personal history of malignant neoplasm of breast: Secondary | ICD-10-CM | POA: Diagnosis not present

## 2024-02-12 DIAGNOSIS — C50912 Malignant neoplasm of unspecified site of left female breast: Secondary | ICD-10-CM | POA: Insufficient documentation

## 2024-02-12 DIAGNOSIS — Z923 Personal history of irradiation: Secondary | ICD-10-CM | POA: Diagnosis not present

## 2024-02-12 DIAGNOSIS — Z9889 Other specified postprocedural states: Secondary | ICD-10-CM | POA: Diagnosis not present

## 2024-02-12 DIAGNOSIS — M810 Age-related osteoporosis without current pathological fracture: Secondary | ICD-10-CM | POA: Diagnosis not present

## 2024-02-13 ENCOUNTER — Ambulatory Visit: Admitting: Oncology

## 2024-02-13 ENCOUNTER — Ambulatory Visit

## 2024-02-14 ENCOUNTER — Inpatient Hospital Stay

## 2024-02-14 ENCOUNTER — Encounter: Payer: Self-pay | Admitting: Oncology

## 2024-02-14 ENCOUNTER — Inpatient Hospital Stay: Attending: Oncology | Admitting: Oncology

## 2024-02-14 VITALS — BP 154/80 | HR 59 | Temp 97.0°F | Resp 17 | Wt 109.4 lb

## 2024-02-14 DIAGNOSIS — C50912 Malignant neoplasm of unspecified site of left female breast: Secondary | ICD-10-CM | POA: Diagnosis not present

## 2024-02-14 DIAGNOSIS — I129 Hypertensive chronic kidney disease with stage 1 through stage 4 chronic kidney disease, or unspecified chronic kidney disease: Secondary | ICD-10-CM | POA: Insufficient documentation

## 2024-02-14 DIAGNOSIS — E785 Hyperlipidemia, unspecified: Secondary | ICD-10-CM | POA: Insufficient documentation

## 2024-02-14 DIAGNOSIS — D509 Iron deficiency anemia, unspecified: Secondary | ICD-10-CM | POA: Diagnosis not present

## 2024-02-14 DIAGNOSIS — I82409 Acute embolism and thrombosis of unspecified deep veins of unspecified lower extremity: Secondary | ICD-10-CM | POA: Diagnosis not present

## 2024-02-14 MED ORDER — APIXABAN 2.5 MG PO TABS: 2.5000 mg | ORAL_TABLET | Freq: Two times a day (BID) | ORAL | 6 refills | Status: AC

## 2024-02-14 NOTE — Progress Notes (Signed)
 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  Now with anemia and DVT.  INTERVAL HISTORY: Patient returns to clinic today for repeat laboratory work, further evaluation, and consideration of additional IV Venofer .  She also had recent mammogram and bone mineral density studies.  She continues to have fatigue, but otherwise feels well.  She continues to tolerate tamoxifen  and Eliquis  without significant side effects.  She has no neurologic complaints.  She denies any recent fevers or illnesses.  She has a good appetite and denies weight loss.  She has no chest pain, shortness of breath, cough, or hemoptysis.  She denies any nausea, vomiting, constipation, or diarrhea.  She has no further melena or hematochezia.  She has no urinary complaints.  Patient offers no further specific complaints today.  REVIEW OF SYSTEMS:   Review of Systems  Constitutional:  Positive for malaise/fatigue. Negative for fever and weight loss.  Respiratory: Negative.  Negative for cough and shortness of breath.   Cardiovascular: Negative.  Negative for chest pain and leg swelling.  Gastrointestinal: Negative.  Negative for abdominal pain.  Genitourinary: Negative.  Negative for dysuria.  Musculoskeletal: Negative.  Negative for back pain and joint pain.  Skin: Negative.  Negative for rash.  Neurological: Negative.  Negative for dizziness, focal weakness, weakness and headaches.   Psychiatric/Behavioral: Negative.  The patient is not nervous/anxious.     As per HPI. Otherwise, a complete review of systems is negative.  PAST MEDICAL HISTORY: Past Medical History:  Diagnosis Date   Allergic rhinitis    Arthritis    joints   Cancer (HCC)    breast  left   Cervical disc disease    Chronic kidney disease    STAGE 3   GERD (gastroesophageal reflux disease)    H/O blood clots    around her 20's   History of asthma    History of colon polyps    History of gout    Hyperlipidemia    Hypertension    Personal history of radiation therapy    PONV (postoperative nausea and vomiting)    nausea   Shingles     PAST SURGICAL HISTORY: Past Surgical History:  Procedure Laterality Date   ABDOMINAL HYSTERECTOMY     BREAST BIOPSY Left 10/26/2021   US  Core Bx ribbon clip positive   BREAST LUMPECTOMY Left 11/11/2021   Procedure: BREAST LUMPECTOMY;  Surgeon: Dessa Reyes ORN, MD;  Location: [facility];  Service: General;  Laterality: Left;   BREAST LUMPECTOMY Left 11/11/2021   invasive lobular ca/neg margins/closest margin .3 mm   COLONOSCOPY  2013   COLONOSCOPY N/A 06/13/2023   Procedure: COLONOSCOPY;  Surgeon: Toledo, Ladell POUR, MD;  Location: [facility];  Service: Gastroenterology;  Laterality: N/A;  Patient on Plavix   COLONOSCOPY WITH PROPOFOL  N/A 10/11/2016   Procedure: COLONOSCOPY WITH PROPOFOL ;  Surgeon: Dellie Louanne MATSU, MD;  Location: [facility];  Service: Endoscopy;  Laterality: N/A;   DISTAL INTERPHALANGEAL JOINT FUSION Right 08/08/2022   Procedure: Right middle finger DISTAL INTERPHALANGEAL JOINT FUSION;  Surgeon: Kathlynn Sharper, MD;  Location: [facility];  Service: Orthopedics;  Laterality: Right;   ELBOW SURGERY Left    ESOPHAGOGASTRODUODENOSCOPY N/A 04/19/2023   Procedure: EGD (ESOPHAGOGASTRODUODENOSCOPY);  Surgeon: Toledo, Ladell POUR, MD;  Location: [facility];  Service: Gastroenterology;  Laterality: N/A;    ESOPHAGOGASTRODUODENOSCOPY N/A 06/13/2023   Procedure: EGD (ESOPHAGOGASTRODUODENOSCOPY);  Surgeon: Toledo, Ladell POUR, MD;  Location: [facility];  Service: Gastroenterology;  Laterality: N/A;   EYE SURGERY     both eyes cataract removed   FEMORAL HERNIA REPAIR Right 09/18/2015   Procedure: HERNIA REPAIR femoral  ADULT;  Surgeon: Louanne KANDICE Muse, MD;  Location: [facility];  Service: General;  Laterality: Right;   HERNIA REPAIR     LAPAROSCOPIC BILATERAL SALPINGO OOPHERECTOMY Bilateral 09/06/2015   Procedure: LAPAROSCOPIC BILATERAL SALPINGO OOPHORECTOMY;  Surgeon: Debby JINNY Dinsmore, MD;  Location: [facility];  Service: Gynecology;  Laterality: Bilateral;   PARTIAL HYSTERECTOMY     TUBAL LIGATION     UPPER GI ENDOSCOPY     WRIST SURGERY Right     FAMILY HISTORY: Family History  Problem Relation Age of Onset   Clotting disorder Mother    Heart failure Mother    Emphysema Father    Heart failure Father    Cancer Daughter        breast cancer   Breast cancer Daughter 39    ADVANCED DIRECTIVES (Y/N):  N  HEALTH MAINTENANCE: Social History   Tobacco Use   Smoking status: Never   Smokeless tobacco: Never  Vaping Use   Vaping status: Never Used  Substance Use Topics   Alcohol use: No   Drug use: No     Colonoscopy:  PAP:  Bone density:  Lipid panel:  Allergies  Allergen Reactions   Codeine Nausea Only   Gabapentin      nightmares   Lipitor [Atorvastatin]     Aches    Pravachol  [Pravastatin  Sodium]     aches   Tramadol      nausea   Aleve [Naproxen Sodium] Rash   Sulfa Antibiotics Itching and Rash   Sulfasalazine Itching and Rash    Current Outpatient Medications  Medication Sig Dispense Refill   acetaminophen  (TYLENOL ) 500 MG tablet Take 500 mg by mouth every 6 (six) hours as needed for mild pain.     amLODipine (NORVASC) 5 MG tablet Take 5 mg by mouth.     apixaban  (ELIQUIS ) 2.5 MG TABS tablet Take 1 tablet (2.5 mg total) by mouth 2 (two) times daily.  60 tablet 6   calcitRIOL (ROCALTROL) 0.25 MCG capsule Take 0.25 mcg by mouth.     colchicine 0.6 MG tablet PLEASE SEE ATTACHED FOR DETAILED DIRECTIONS     Febuxostat  80 MG TABS Take 0.5 tablets (40 mg total) by mouth daily.     fluticasone-salmeterol (ADVAIR) 100-50 MCG/ACT AEPB Inhale 1 puff into the lungs every 12 (twelve) hours.     fluvastatin XL (LESCOL XL) 80 MG 24 hr tablet Take 80 mg by mouth daily.  2   JARDIANCE 10 MG TABS tablet Take 10 mg by mouth every morning.     metoprolol  tartrate (LOPRESSOR ) 25 MG tablet Take 25 mg by mouth 2 (two) times daily.     olmesartan (BENICAR) 20 MG tablet Take 20 mg by mouth.     pantoprazole  (PROTONIX ) 40 MG tablet TAKE 1 TABLET BY MOUTH ONCE A DAY 30-60 MINUTES BEFORE FIRST MEAL OF THE DAY 90 tablet 1   tamoxifen  (NOLVADEX ) 20 MG tablet Take  1 tablet (20 mg total) by mouth daily. 90 tablet 1   [Paused] alendronate  (FOSAMAX ) 70 MG tablet TAKE 1 TABLET BY MOUTH ONCE A WEEK. TAKE WITH A FULL GLASS OF WATER ON AN EMPTY STOMACH. (Patient not taking: Reported on 02/14/2024) 12 tablet 3   olmesartan-hydrochlorothiazide (BENICAR HCT) 20-12.5 MG tablet Take 1 tablet by mouth daily. (Patient not taking: Reported on 02/14/2024)     No current facility-administered medications for this visit.    OBJECTIVE: Vitals:   02/14/24 1420  BP: (!) 154/80  Pulse: (!) 59  Resp: 17  Temp: (!) 97 F (36.1 C)  SpO2: 99%      Body mass index is 21.37 kg/m.    ECOG FS:0 - Asymptomatic  General: Well-developed, well-nourished, no acute distress. Eyes: Pink conjunctiva, anicteric sclera. HEENT: Normocephalic, moist mucous membranes. Lungs: No audible wheezing or coughing. Heart: Regular rate and rhythm. Abdomen: Soft, nontender, no obvious distention. Musculoskeletal: No edema, cyanosis, or clubbing. Neuro: Alert, answering all questions appropriately. Cranial nerves grossly intact. Skin: No rashes or petechiae noted. Psych: Normal affect.  LAB RESULTS:  Lab  Results  Component Value Date   NA 136 04/20/2023   K 4.5 04/20/2023   CL 102 04/20/2023   CO2 21 (L) 04/20/2023   GLUCOSE 86 04/20/2023   BUN 31 (H) 04/20/2023   CREATININE 2.20 (H) 04/20/2023   CALCIUM  8.3 (L) 04/20/2023   PROT 6.7 04/17/2023   ALBUMIN  3.5 04/17/2023   AST 25 04/17/2023   ALT 17 04/17/2023   ALKPHOS 38 04/17/2023   BILITOT 0.8 04/17/2023   GFRNONAA 22 (L) 04/20/2023   GFRAA 48 (L) 09/18/2015    Lab Results  Component Value Date   WBC 6.2 02/04/2024   NEUTROABS 4.4 02/04/2024   HGB 13.2 02/04/2024   HCT 41.1 02/04/2024   MCV 93.6 02/04/2024   PLT 177 02/04/2024   Lab Results  Component Value Date   IRON  68 02/04/2024   TIBC 307 02/04/2024   IRONPCTSAT 22 02/04/2024   Lab Results  Component Value Date   FERRITIN 438 (H) 02/04/2024     STUDIES: MM DIAG BREAST TOMO BILATERAL Addendum Date: 02/14/2024 ADDENDUM REPORT: 02/14/2024 07:32 IMPRESSION: 1. Postsurgical changes of LEFT breast lumpectomy without new suspicious abnormality. The ribbon clip from the site of biopsy-proven malignancy remains within the LEFT breast; as noted previously, it is unclear if the biopsy site has been removed or if the site of original cancer remains within the breast. Although surgical excision has been previously recommended, the patient's care team has opted for imaging surveillance over surgical reexcision. 2. No mammographic evidence of malignancy in the RIGHT breast. RECOMMENDATION: Continued management per surgical care team in the setting of retained biopsy clip status post lumpectomy. At minimum, BILATERAL diagnostic mammogram in 1 year (with BILATERAL ultrasound if deemed necessary). I have discussed the findings and recommendations with the patient. If applicable, a reminder letter will be sent to the patient regarding the next appointment. BI-RADS CATEGORY  6: Known biopsy-proven malignancy. Electronically Signed   By: Norleen Croak M.D.   On: 02/14/2024 07:32   Result  Date: 02/14/2024 CLINICAL DATA:  Interval 20 pound weight loss. 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 The clip was not seen in the specimen. Surgical pathology demonstrated a 5 mm focus of invasive lobular carcinoma with biopsy site change and negative margins. Patient received adjuvant radiation  therapy and is currently on tamoxifen . Presents today for postlumpectomy surveillance and is due for screening of both breasts. EXAM: DIGITAL DIAGNOSTIC BILATERAL MAMMOGRAM WITH TOMOSYNTHESIS AND CAD TECHNIQUE: Bilateral digital diagnostic mammography and breast tomosynthesis was performed. The images were evaluated with computer-aided detection. COMPARISON:  Previous exam(s). ACR Breast Density Category c: The breasts are heterogeneously dense, which may obscure small masses. FINDINGS: No suspicious mass, calcification, architectural distortion or other worrisome abnormality in the RIGHT breast. There is density and architectural distortion within the LEFT breast, corresponding to the site of prior lumpectomy. Spot compression magnification view(s) demonstrate no evidence of recurrent malignancy. Spot compression tomosynthesis views demonstrate a persistent ribbon clip without significant surrounding mammographic changes. Despite multiple attempts it was challenging to pull the ribbon clip into the field of view on spot compression imaging, likely due to interval weight loss, with the ribbon positioned at the edge of the field-of-view. Findings are stable compared to prior. There are no new suspicious abnormalities in the LEFT breast. A mole marker was placed over a LEFT breast mole seen on the MLO view. IMPRESSION: 1. Postsurgical changes of LEFT breast lumpectomy without new suspicious abnormality. The ribbon clip from the site of biopsy-proven malignancy remains within the LEFT breast; as  noted previously, it is unclear if the biopsy site has been removed or if the site of original cancer remains within the breast. Although surgical excision has been previously recommended, the patient's care team has opted for imaging surveillance over surgical reexcision. 2.  No mammographic evidence of malignancy in the RIGHT breast. RECOMMENDATION: Continued management per surgical care team in the setting of retained biopsy clip status post lumpectomy. At minimum, BILATERAL diagnostic mammogram (with BILATERAL ultrasound if deemed necessary). I have discussed the findings and recommendations with the patient. If applicable, a reminder letter will be sent to the patient regarding the next appointment. BI-RADS CATEGORY  6: Known biopsy-proven malignancy. Electronically Signed: By: Norleen Croak M.D. On: 02/12/2024 11:35   DG Bone Density Result Date: 02/12/2024 EXAM: DUAL X-RAY ABSORPTIOMETRY (DXA) FOR BONE MINERAL DENSITY 02/12/2024 10:24 am CLINICAL DATA:  84 year old Female Postmenopausal. Postmenopausal History of fragility fracture. Patient is or has been on bone building therapies. TECHNIQUE: An axial (e.g., hips, spine) and/or appendicular (e.g., radius) exam was performed, as appropriate, using GE Secretary/administrator at [facility]. Images are obtained for bone mineral density measurement and are not obtained for diagnostic purposes. [id number] Exclusions: Lumbar spine due to advanced degenerative changes. COMPARISON:  02/06/2023. FINDINGS: Scan quality: Good. LEFT FEMORAL NECK: BMD (in g/cm2): 0.612 T-score: -3.1 Z-score: -0.8 LEFT TOTAL HIP: BMD (in g/cm2): 0.575 T-score: -3.4 Z-score: -1.4 RIGHT FEMORAL NECK: BMD (in g/cm2): 0.609 T-score: -3.1 Z-score: -0.9 RIGHT TOTAL HIP: BMD (in g/cm2): 0.582 T-score: -3.4 Z-score: -1.3 DUAL-FEMUR TOTAL MEAN: Rate of change from previous exam: No significant rate of change from previous exam. LEFT FOREARM (RADIUS 33%): BMD (in g/cm2): 0.550  T-score: -3.7 Z-score: -0.9 Rate of change from previous exam: No significant rate of change from previous exam. FRAX 10-YEAR PROBABILITY OF FRACTURE: FRAX not reported as the lowest BMD is not in the osteopenia range. IMPRESSION: Osteoporosis based on BMD. Fracture risk is unknown due to history of bone building therapy. RECOMMENDATIONS: 1. All patients should optimize calcium  and vitamin D intake. 2. Consider FDA-approved medical therapies in postmenopausal women and men aged 51 years and older, based on the following: - A hip or vertebral (clinical or morphometric) fracture - T-score less  than or equal to -2.5 and secondary causes have been excluded. - Low bone mass (T-score between -1.0 and -2.5) and a 10-year probability of a hip fracture greater than or equal to 3% or a 10-year probability of a major osteoporosis-related fracture greater than or equal to 20% based on the [place] -adapted WHO algorithm. - Clinician judgment and/or patient preferences may indicate treatment for people with 10-year fracture probabilities above or below these levels 3. Patients with diagnosis of osteoporosis or at high risk for fracture should have regular bone mineral density tests. For patients eligible for Medicare, routine testing is allowed once every 2 years. The testing frequency can be increased to one year for patients who have rapidly progressing disease, those who are receiving or discontinuing medical therapy to restore bone mass, or have additional risk factors. Electronically Signed   By: Harrietta Sherry M.D.   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  MRI of the breast completed on November 04, 2021 did not reveal any additional malignancy.   Given the small size of the mass, she is considered a T1a therefore Oncotype testing nor chemotherapy was necessary.  Patient completed adjuvant XRT approximately January 2024.  Given her osteoporosis, patient was prescribed tamoxifen .  Patient had a DVT while taking tamoxifen .  She is hesitant to discontinue Eliquis , so this has been dose reduced to 2.5 mg twice per day.  Continue tamoxifen  along with Eliquis  and through at least January 2029.  Her most recent mammogram in February 12, 2024 was reported as BI-RADS 6 for unclear reasons.  Repeat in January 2027.  Return to clinic in 6 months for routine evaluation.   Osteoporosis: Patient's most recent bone mineral density on February 12, 2024 was reported as unchanged from previous.  Continue Fosamax , calcium , and vitamin D.  Tamoxifen  as above.  Repeat in January 2027 along with mammogram as above. DVT/possible PE: Although patient has taken 6 months of Eliquis , she is hesitant to discontinue therefore we will dose reduce surgical prophylactic dosing of 2.5 mg twice per day.  Continue tamoxifen  as above. Iron  deficiency anemia: Resolved.  Colonoscopy and upper endoscopy on Jun 13, 2023 did not reveal any significant pathology.  Recent capsule endoscopy was reported as negative.  No intervention is needed at this time.  Patient does not require additional IV Venofer .  She last received treatment on December 19, 2023.  Return to clinic in 6 months as above.     Patient expressed understanding and was in agreement with this plan. She also understands that She can call clinic at any time with any questions, concerns, or complaints.    Cancer Staging  Invasive ductal carcinoma of left breast [facility]) Staging form: Breast, AJCC 8th Edition - Clinical stage from 11/04/2021: Stage IA (cT1a, cN0, cM0, G2, ER+, PR+, HER2-) - Signed by Jacobo Evalene PARAS, MD on 11/04/2021 Stage prefix: Initial diagnosis Histologic grading system: 3 grade system  Evalene PARAS Jacobo, MD    02/15/2024 2:34 PM     "

## 2024-02-15 ENCOUNTER — Encounter: Payer: Self-pay | Admitting: Oncology

## 2024-02-18 ENCOUNTER — Ambulatory Visit: Payer: Self-pay | Admitting: General Surgery

## 2024-02-18 NOTE — H&P (View-Only) (Signed)
 "   REFERRING PHYSICIAN:  Bella Coe  PROVIDER:  BERNARDA WANDA NED, Jane Griffin  MRN: [id number] DOB: 01/19/1941 DATE OF ENCOUNTER: 02/18/2024  Subjective   Chief Complaint: New Consultation     History of Present Illness: Jane Griffin is a 84 y.o. female who is seen today as an office consultation at the request of Dr. Cesar Coe for evaluation of New Consultation .  84 y.o. F with a h/o breast cancer and anemia who presents to the office with rectal prolapse.  She reports an increase in diarrhea over the last year.  Colonoscopy was performed on 06/13/23 and internal hemorrhoids were found as well as incomplete rectal prolapse. Exam was otherwise normal.  She was referred to Dr Bella Coe for hemorrhoids and he noted complete rectal prolapse.  She was referred here for surgical evaluation.  Surgical history consists of femoral hernia repair and bilateral oophorectomy.     Review of Systems: A complete review of systems was obtained from the patient.  I have reviewed this information and discussed as appropriate with the patient.  See HPI as well for other ROS.    Medical History: Past Medical History:  Diagnosis Date   Allergic rhinitis    Asthma (HHS-HCC) 02/07/2008   in records and suggested by pulmonary consult 2010   Breast cancer (CMS/HHS-HCC) 11/11/2021   5 mm invasive lobular carcinoma, ER+   Cervical disc disease    Chronic back pain    Chronic cough    Chronic kidney disease    GERD (gastroesophageal reflux disease)    History of blood clots    in her 20's   History of colon polyps    HTN (hypertension)    Hyperlipidemia    Phlebitis    PONV (postoperative nausea and vomiting)    Shingles     Patient Active Problem List  Diagnosis   GERD (gastroesophageal reflux disease)   Asthma (HHS-HCC)   Benign hypertension with CKD (chronic kidney disease) stage IV (CMS/HHS-HCC)   Health care maintenance   Atherosclerosis of abdominal aorta ()    Neurocardiogenic pre-syncope   Ovarian mass, right   Upper airway cough syndrome   B12 deficiency   Prediabetes   Invasive ductal carcinoma of left breast (CMS/HHS-HCC)   Chronic gouty arthritis   SVT (supraventricular tachycardia) (HHS-HCC)   Coronary artery disease due to calcified coronary lesion   PHT (pulmonary hypertension) (CMS/HHS-HCC)   Acute deep vein thrombosis (DVT) of proximal vein of lower extremity (CMS/HHS-HCC)   Mild intermittent asthma with exacerbation (HHS-HCC)   Bronchiectasis (CMS/HHS-HCC)    Past Surgical History:  Procedure Laterality Date   HYSTERECTOMY  1983   for dysmenorrhea. Ovaries remain   posterior colporrhaphy with enterocele ligation  2007   UPPER GASTROINTESTINAL ENDOSCOPY  2010   normal   Laparoscopic BSO and LOA  08/19/2015   Repair of incarcerated right femoral hernia  09/18/2015   Dr Dellie   COLONOSCOPY  10/11/2016   ultrasound guided breast biopsy Left 10/26/2021   MASTECTOMY PARTIAL / LUMPECTOMY Left 11/11/2021   DIP Fusion Right 08/08/2022   Right  [place], Dr. Ozell Flake   EGD @ [facility]  04/19/2023   Gastric polyps/ 2 cm hiatal hernia/ otherwise normal esophagus/ Repeat prn/ TKT   EGD @ [facility]  06/13/2023   Fundic gland polyp/No repeat/TKT   Colon @ [facility]  06/13/2023   Colon @ [facility]  06/13/2023   Normal examined colon/No repeat due to age/TKT   COLONOSCOPY  01/25/2011 Dr  Anderson   normal, in past adenomatous polyps   ELBOW SURGERY     left   Hernia surgery repair     LAPAROSCOPIC TUBAL LIGATION     Right wrist hardware placed     UPPER GASTROINTESTINAL ENDOSCOPY  bravo 2004   cough and reflux good correlation by history from recods     Allergies  Allergen Reactions   Advil [Ibuprofen] Rash   Codeine Nausea   Gabapentin  Other (See Comments)    nightmares     Lescol [Fluvastatin] Unknown   Lipitor [Atorvastatin] Muscle Pain   Pravachol  [Pravastatin ] Muscle Pain   Sulfa  (Sulfonamide Antibiotics) Nausea   Tramadol  Nausea   Aleve [Naproxen Sodium] Rash   Sulfasalazine Itching and Rash    Current Outpatient Medications on File Prior to Visit  Medication Sig Dispense Refill   acetaminophen  (TYLENOL ) 500 MG tablet Take 500 mg by mouth every 8 (eight) hours     apixaban  (ELIQUIS ) 5 mg tablet Take 5 mg by mouth every 12 (twelve) hours     empagliflozin  (JARDIANCE ) 10 mg tablet Take 10 mg by mouth once daily     fluvastatin XL (LESCOL XL) 80 mg XL tablet TAKE 1 TABLET BY MOUTH EVERY DAY 90 tablet 3   ipratropium-albuteroL  (DUO-NEB) nebulizer solution Take 3 mLs by nebulization 4 (four) times daily for 360 days 180 mL 2   metoprolol  TARTrate (LOPRESSOR ) 25 MG tablet TAKE 1 TABLET BY MOUTH TWICE A DAY 180 tablet 3   olmesartan -hydroCHLOROthiazide (BENICAR  HCT) 20-12.5 mg tablet Take 1 tablet by mouth once daily 90 tablet 3   pantoprazole  (PROTONIX ) 40 MG DR tablet TAKE 1 TABLET BY MOUTH EVERY DAY 90 tablet 1   tamoxifen  (NOLVADEX ) 20 MG tablet Take 20 mg by mouth once daily     peg-electrolyte (NULYTELY ) solution Use as directed. (Patient not taking: Reported on 02/18/2024) 4000 mL 0   Current Facility-Administered Medications on File Prior to Visit  Medication Dose Route Frequency Provider Last Rate Last Admin   cyanocobalamin (VITAMIN B12) injection 1,000 mcg  1,000 mcg Intramuscular Q30 Days Jane Layman Blush III, Jane Griffin   1,000 mcg at 02/12/24 1437    Family History  Problem Relation Age of Onset   Heart failure Mother    Pulmonary embolism Mother    High blood pressure (Hypertension) Mother    Diabetes Mother    Heart failure Father    Myocardial Infarction (Heart attack) Father    High blood pressure (Hypertension) Father    Emphysema Father    Breast cancer Daughter 76   Multiple sclerosis Daughter    Colon cancer Neg Hx      Social History   Tobacco Use  Smoking Status Never  Smokeless Tobacco Never     Social  History   Socioeconomic History   Marital status: Widowed  Tobacco Use   Smoking status: Never   Smokeless tobacco: Never  Vaping Use   Vaping status: Never Used  Substance and Sexual Activity   Alcohol use: No    Alcohol/week: 0.0 standard drinks of alcohol   Drug use: No   Sexual activity: Not Currently   Social Drivers of Health   Financial Resource Strain: Low Risk  (08/21/2023)   Overall Financial Resource Strain (CARDIA)    Difficulty of Paying Living Expenses: Not hard at all  Food Insecurity: No Food Insecurity (08/21/2023)   Hunger Vital Sign    Worried About Running Out of Food in the Last Year: Never true  Ran Out of Food in the Last Year: Never true  Transportation Needs: No Transportation Needs (08/21/2023)   PRAPARE - Administrator, Civil Service (Medical): No    Lack of Transportation (Non-Medical): No  Physical Activity: Insufficiently Active (11/29/2021)   Received from [facility]   Exercise Vital Sign    On average, how many days per week do you engage in moderate to strenuous exercise (like a brisk walk)?: 2 days    On average, how many minutes do you engage in exercise at this level?: 30 min  Stress: Stress Concern Present (11/29/2021)   Received from [facility] of Occupational Health - Occupational Stress Questionnaire    Feeling of Stress : To some extent  Social Connections: Moderately Isolated (04/18/2023)   Received from [facility]   Social Connection and Isolation Panel    In a typical week, how many times do you talk on the phone with family, friends, or neighbors?: Three times a week    How often do you get together with friends or relatives?: Three times a week    How often do you attend church or religious services?: 1 to 4 times per year    Do you belong to any clubs or organizations such as church groups, unions, fraternal or athletic groups, or school groups?: No    How often do you  attend meetings of the clubs or organizations you belong to?: Never    Are you married, widowed, divorced, separated, never married, or living with a partner?: Widowed  Housing Stability: Low Risk  (08/21/2023)   Housing Stability Vital Sign    Unable to Pay for Housing in the Last Year: No    Number of Times Moved in the Last Year: 0    Homeless in the Last Year: No    Objective:    Vitals:   02/18/24 1333 02/18/24 1334  BP: (!) 174/96   Pulse: 89   Temp: 36.7 C (98 F)   SpO2: 97%   Weight: 49.5 kg (109 lb 3.2 oz)   Height: 157.5 cm (5' 2)   PainSc:    8  PainLoc:  Rectum     Exam Gen: NAD Abd: soft Rectal: Decreased rectal tone, moderate squeeze pressure   Labs, Imaging and Diagnostic Testing: Colonoscopy report reviewed. Previous office notes reviewed. Assessment and Plan:  Rectal prolapse  (primary encounter diagnosis)  84 year old female with full-thickness rectal prolapse who presents to the office for surgical evaluation.  She has recently had a colonoscopy which shows no sign of malignancy.  She is being treated for some anemia.  Most recent hemoglobin was 13.  We discussed proceeding with robotic assisted rectopexy.  We discussed this in detail today.  All questions were answered.  Patient would like to schedule as soon as possible.  The surgery and anatomy were described to the patient as well as the risks of surgery and the possible complications.  These include: Bleeding, deep abdominal infections and possible wound complications such as hernia and infection, damage to adjacent structures, possible need for other procedures, such as abscess drains in radiology, possible prolonged hospital stay, possible incontinence, possible constipation from narcotics, possible bowel, bladder or sexual dysfunction if having rectal surgery, prolonged fatigue/weakness or appetite loss, possible early recurrence of of disease, possible complications of their medical problems  such as heart disease or arrhythmias or lung problems, death (less than 1%). I believe the patient understands and wishes to proceed  with the surgery.   Bernarda JAYSON Ned, Jane Griffin Colon and Rectal Surgery [facility]        "

## 2024-02-18 NOTE — H&P (Signed)
 " REFERRING PHYSICIAN:  Bella Coe   PROVIDER:  BERNARDA WANDA NED, MD   MRN: [id number] DOB: 13-Oct-1940 DATE OF ENCOUNTER: 02/18/2024   Subjective    Chief Complaint: New Consultation       History of Present Illness: Jane Griffin is a 84 y.o. female who is seen today as an office consultation at the request of Dr. Cesar Coe for evaluation of New Consultation .  84 y.o. F with a h/o breast cancer and anemia who presents to the office with rectal prolapse.  She reports an increase in diarrhea over the last year.  Colonoscopy was performed on 06/13/23 and internal hemorrhoids were found as well as incomplete rectal prolapse. Exam was otherwise normal.  She was referred to Dr Bella Coe for hemorrhoids and he noted complete rectal prolapse.  She was referred here for surgical evaluation.  Surgical history consists of femoral hernia repair and bilateral oophorectomy.       Review of Systems: A complete review of systems was obtained from the patient.  I have reviewed this information and discussed as appropriate with the patient.  See HPI as well for other ROS.       Medical History: Past Medical History      Past Medical History:  Diagnosis Date   Allergic rhinitis     Asthma (HHS-HCC) 02/07/2008    in records and suggested by pulmonary consult 2010   Breast cancer (CMS/HHS-HCC) 11/11/2021    5 mm invasive lobular carcinoma, ER+   Cervical disc disease     Chronic back pain     Chronic cough     Chronic kidney disease     GERD (gastroesophageal reflux disease)     History of blood clots      in her 20's   History of colon polyps     HTN (hypertension)     Hyperlipidemia     Phlebitis     PONV (postoperative nausea and vomiting)     Shingles          Problem List     Patient Active Problem List  Diagnosis   GERD (gastroesophageal reflux disease)   Asthma (HHS-HCC)   Benign hypertension with CKD (chronic kidney disease) stage IV (CMS/HHS-HCC)   Health care  maintenance   Atherosclerosis of abdominal aorta ()   Neurocardiogenic pre-syncope   Ovarian mass, right   Upper airway cough syndrome   B12 deficiency   Prediabetes   Invasive ductal carcinoma of left breast (CMS/HHS-HCC)   Chronic gouty arthritis   SVT (supraventricular tachycardia) (HHS-HCC)   Coronary artery disease due to calcified coronary lesion   PHT (pulmonary hypertension) (CMS/HHS-HCC)   Acute deep vein thrombosis (DVT) of proximal vein of lower extremity (CMS/HHS-HCC)   Mild intermittent asthma with exacerbation (HHS-HCC)   Bronchiectasis (CMS/HHS-HCC)        Past Surgical History       Past Surgical History:  Procedure Laterality Date   HYSTERECTOMY   1983    for dysmenorrhea. Ovaries remain   posterior colporrhaphy with enterocele ligation   2007   UPPER GASTROINTESTINAL ENDOSCOPY   2010    normal   Laparoscopic BSO and LOA   08/19/2015   Repair of incarcerated right femoral hernia   09/18/2015    Dr Dellie   COLONOSCOPY   10/11/2016   ultrasound guided breast biopsy Left 10/26/2021   MASTECTOMY PARTIAL / LUMPECTOMY Left 11/11/2021   DIP Fusion Right 08/08/2022    Right  [place],  Dr. Ozell Flake   EGD @ [facility]   04/19/2023    Gastric polyps/ 2 cm hiatal hernia/ otherwise normal esophagus/ Repeat prn/ TKT   EGD @ [facility]   06/13/2023    Fundic gland polyp/No repeat/TKT   Colon @ [facility]   06/13/2023   Colon @ [facility]   06/13/2023    Normal examined colon/No repeat due to age/TKT   COLONOSCOPY   01/25/2011 Dr Lenon    normal, in past adenomatous polyps   ELBOW SURGERY        left   Hernia surgery repair       LAPAROSCOPIC TUBAL LIGATION       Right wrist hardware placed       UPPER GASTROINTESTINAL ENDOSCOPY   bravo 2004    cough and reflux good correlation by history from recods        Allergies       Allergies  Allergen Reactions   Advil [Ibuprofen] Rash   Codeine Nausea   Gabapentin  Other (See Comments)      nightmares     Lescol  [Fluvastatin] Unknown   Lipitor [Atorvastatin] Muscle Pain   Pravachol  [Pravastatin ] Muscle Pain   Sulfa (Sulfonamide Antibiotics) Nausea   Tramadol  Nausea   Aleve [Naproxen Sodium] Rash   Sulfasalazine Itching and Rash        Medications Ordered Prior to Encounter        Current Outpatient Medications on File Prior to Visit  Medication Sig Dispense Refill   acetaminophen  (TYLENOL ) 500 MG tablet Take 500 mg by mouth every 8 (eight) hours       apixaban  (ELIQUIS ) 5 mg tablet Take 5 mg by mouth every 12 (twelve) hours       empagliflozin (JARDIANCE) 10 mg tablet Take 10 mg by mouth once daily       fluvastatin XL (LESCOL XL) 80 mg XL tablet TAKE 1 TABLET BY MOUTH EVERY DAY 90 tablet 3   ipratropium-albuteroL  (DUO-NEB) nebulizer solution Take 3 mLs by nebulization 4 (four) times daily for 360 days 180 mL 2   metoprolol  TARTrate (LOPRESSOR ) 25 MG tablet TAKE 1 TABLET BY MOUTH TWICE A DAY 180 tablet 3   olmesartan-hydroCHLOROthiazide (BENICAR HCT) 20-12.5 mg tablet Take 1 tablet by mouth once daily 90 tablet 3   pantoprazole  (PROTONIX ) 40 MG DR tablet TAKE 1 TABLET BY MOUTH EVERY DAY 90 tablet 1   tamoxifen  (NOLVADEX ) 20 MG tablet Take 20 mg by mouth once daily       peg-electrolyte (NULYTELY ) solution Use as directed. (Patient not taking: Reported on 02/18/2024) 4000 mL 0             Current Facility-Administered Medications on File Prior to Visit  Medication Dose Route Frequency Provider Last Rate Last Admin   cyanocobalamin (VITAMIN B12) injection 1,000 mcg  1,000 mcg Intramuscular Q30 Days Lenon Layman Tanda DOUGLAS, MD   1,000 mcg at 02/12/24 1437        Family History       Family History  Problem Relation Age of Onset   Heart failure Mother     Pulmonary embolism Mother     High blood pressure (Hypertension) Mother     Diabetes Mother     Heart failure Father     Myocardial Infarction (Heart attack) Father     High blood pressure (Hypertension) Father     Emphysema  Father     Breast cancer Daughter 22   Multiple sclerosis Daughter  Colon cancer Neg Hx          Tobacco Use History  Social History       Tobacco Use  Smoking Status Never  Smokeless Tobacco Never        Social History  Social History         Socioeconomic History   Marital status: Widowed  Tobacco Use   Smoking status: Never   Smokeless tobacco: Never  Vaping Use   Vaping status: Never Used  Substance and Sexual Activity   Alcohol use: No      Alcohol/week: 0.0 standard drinks of alcohol   Drug use: No   Sexual activity: Not Currently    Social Drivers of Health        Financial Resource Strain: Low Risk  (08/21/2023)    Overall Financial Resource Strain (CARDIA)     Difficulty of Paying Living Expenses: Not hard at all  Food Insecurity: No Food Insecurity (08/21/2023)    Hunger Vital Sign     Worried About Running Out of Food in the Last Year: Never true     Ran Out of Food in the Last Year: Never true  Transportation Needs: No Transportation Needs (08/21/2023)    PRAPARE - Therapist, Art (Medical): No     Lack of Transportation (Non-Medical): No  Physical Activity: Insufficiently Active (11/29/2021)    Received from [facility]    Exercise Vital Sign     On average, how many days per week do you engage in moderate to strenuous exercise (like a brisk walk)?: 2 days     On average, how many minutes do you engage in exercise at this level?: 30 min  Stress: Stress Concern Present (11/29/2021)    Received from [facility] of Occupational Health - Occupational Stress Questionnaire     Feeling of Stress : To some extent  Social Connections: Moderately Isolated (04/18/2023)    Received from [facility]    Social Connection and Isolation Panel     In a typical week, how many times do you talk on the phone with family, friends, or neighbors?: Three times a week     How often do you get together with friends or  relatives?: Three times a week     How often do you attend church or religious services?: 1 to 4 times per year     Do you belong to any clubs or organizations such as church groups, unions, fraternal or athletic groups, or school groups?: No     How often do you attend meetings of the clubs or organizations you belong to?: Never     Are you married, widowed, divorced, separated, never married, or living with a partner?: Widowed  Housing Stability: Low Risk  (08/21/2023)    Housing Stability Vital Sign     Unable to Pay for Housing in the Last Year: No     Number of Times Moved in the Last Year: 0     Homeless in the Last Year: No        Objective:          Vitals:    02/18/24 1333 02/18/24 1334  BP: (!) 174/96    Pulse: 89    Temp: 36.7 C (98 F)    SpO2: 97%    Weight: 49.5 kg (109 lb 3.2 oz)    Height: 157.5 cm (5' 2)    PainSc:  8  PainLoc:   Rectum      Exam Gen: NAD Abd: soft Rectal: Decreased rectal tone, moderate squeeze pressure     Labs, Imaging and Diagnostic Testing: Colonoscopy report reviewed. Previous office notes reviewed. Assessment and Plan:  Rectal prolapse  (primary encounter diagnosis)   84 year old female with full-thickness rectal prolapse who presents to the office for surgical evaluation.  She has recently had a colonoscopy which shows no sign of malignancy.  She is being treated for some anemia.  Most recent hemoglobin was 13.  We discussed proceeding with robotic assisted rectopexy.  We discussed this in detail today.  All questions were answered.  Patient would like to schedule as soon as possible.   The surgery and anatomy were described to the patient as well as the risks of surgery and the possible complications.  These include: Bleeding, deep abdominal infections and possible wound complications such as hernia and infection, damage to adjacent structures, possible need for other procedures, such as abscess drains in radiology, possible  prolonged hospital stay, possible incontinence, possible constipation from narcotics, possible bowel, bladder or sexual dysfunction if having rectal surgery, prolonged fatigue/weakness or appetite loss, possible early recurrence of of disease, possible complications of their medical problems such as heart disease or arrhythmias or lung problems, death (less than 1%). I believe the patient understands and wishes to proceed with the surgery.    Bernarda JAYSON Ned, MD Colon and Rectal Surgery [facility]      "

## 2024-02-19 ENCOUNTER — Telehealth: Payer: Self-pay

## 2024-02-19 ENCOUNTER — Telehealth: Payer: Self-pay | Admitting: *Deleted

## 2024-02-19 NOTE — Telephone Encounter (Signed)
 Burnard from [facility] called stating they need Eliquis  instructions. She said she faxed something yesterday but hasn't heard back. She states that Dr Debby note is in Epic if you need to review. And if the instructions could be faxed to [phone] attn:Kelly.

## 2024-02-19 NOTE — Telephone Encounter (Signed)
 Hematology Clearance faxed to [facility].

## 2024-02-22 ENCOUNTER — Telehealth: Payer: Self-pay | Admitting: *Deleted

## 2024-02-22 NOTE — Telephone Encounter (Signed)
 Caller verified using pt's full name and dob prior to discussing PHI    Patient called to verify that her medical clearance to stop Eliquis  was sent to [facility].  Patient notified that per Kendra's documentation the medical clearance was faxed on 1/13. Patient stated that she called the surgeon's office this morning and also called on Wednesday and the office said they did not have the form. I told the patient that I would give the msg to [facility] to follow-up on this.

## 2024-03-04 NOTE — Patient Instructions (Signed)
 SURGICAL WAITING ROOM VISITATION  Patients having surgery or a procedure may have no more than 2 support people in the waiting area - these visitors may rotate.    Children ages 60 and under will not be able to visit patients in [facility] under most circumstances.   Visitors with respiratory illnesses are discouraged from visiting and should remain at home.  If the patient needs to stay at the hospital during part of their recovery, the visitor guidelines for inpatient rooms apply. Pre-op nurse will coordinate an appropriate time for 1 support person to accompany patient in pre-op.  This support person may not rotate.    Please refer to the [facility] website for the visitor guidelines for Inpatients (after your surgery is over and you are in a regular room).       Your procedure is scheduled on:   03/14/2024    Report to [facility] Main Entrance    Report to admitting at   1145 AM   Call this number if you have problems the morning of surgery [phone]   Clear liquid diet on day of bowel prep.              Follow bowel prep instructions per MD.    After Midnight you may have the following liquids until _ 1045_____ AM DAY OF SURGERY  Water Non-Citrus Juices (without pulp, NO RED-Apple, White grape, White cranberry) Black Coffee (NO MILK/CREAM OR CREAMERS, sugar ok)  Clear Tea (NO MILK/CREAM OR CREAMERS, sugar ok) regular and decaf                             Plain Jell-O (NO RED)                                           Fruit ices (not with fruit pulp, NO RED)                                     Popsicles (NO RED)                                                               Sports drinks like Gatorade (NO RED)              Drink 2 Ensure/G2 drinks AT 10:00 PM the night before surgery.        The day of surgery:  Drink ONE (1) Pre-Surgery Clear Ensure or G2 at   1045AM the morning of surgery. Drink in one sitting. Do not sip.  This drink was given to  you during your hospital  pre-op appointment visit. Nothing else to drink after completing the  Pre-Surgery Clear Ensure or G2.          If you have questions, please contact your surgeons office.   FOLLOW BOWEL PREP AND ANY ADDITIONAL PRE OP INSTRUCTIONS YOU RECEIVED FROM YOUR SURGEON'S OFFICE!!!     Oral Hygiene is also important to reduce your risk of infection.  Remember - BRUSH YOUR TEETH THE MORNING OF SURGERY WITH YOUR REGULAR TOOTHPASTE  DENTURES WILL BE REMOVED PRIOR TO SURGERY PLEASE DO NOT APPLY Poly grip OR ADHESIVES!!!   Do NOT smoke after Midnight   Stop all vitamins and herbal supplements 7 days before surgery.   Take these medicines the morning of surgery with A SIP OF WATER:  inhalers as usual and bring, nebulzier if needed, metoprolol , protonix               Jardiance -   DO NOT TAKE ANY ORAL DIABETIC MEDICATIONS DAY OF YOUR SURGERY  Bring CPAP mask and tubing day of surgery.                              You may not have any metal on your body including hair pins, jewelry, and body piercing             Do not wear make-up, lotions, powders, perfumes/cologne, or deodorant  Do not wear nail polish including gel and S&S, artificial/acrylic nails, or any other type of covering on natural nails including finger and toenails. If you have artificial nails, gel coating, etc. that needs to be removed by a nail salon please have this removed prior to surgery or surgery may need to be canceled/ delayed if the surgeon/ anesthesia feels like they are unable to be safely monitored.   Do not shave  48 hours prior to surgery.               Men may shave face and neck.   Do not bring valuables to the hospital. [facility] IS NOT             RESPONSIBLE   FOR VALUABLES.   Contacts, glasses, dentures or bridgework may not be worn into surgery.   Bring small overnight bag day of surgery.   DO NOT BRING YOUR HOME MEDICATIONS TO THE  HOSPITAL. PHARMACY WILL DISPENSE MEDICATIONS LISTED ON YOUR MEDICATION LIST TO YOU DURING YOUR ADMISSION IN THE HOSPITAL!    Patients discharged on the day of surgery will not be allowed to drive home.  Someone NEEDS to stay with you for the first 24 hours after anesthesia.   Special Instructions: Bring a copy of your healthcare power of attorney and living will documents the day of surgery if you haven't scanned them before.              Please read over the following fact sheets you were given: IF YOU HAVE QUESTIONS ABOUT YOUR PRE-OP INSTRUCTIONS PLEASE CALL [phone].   If you received a COVID test during your pre-op visit  it is requested that you wear a mask when out in public, stay away from anyone that may not be feeling well and notify your surgeon if you develop symptoms. If you test positive for Covid or have been in contact with anyone that has tested positive in the last 10 days please notify you surgeon.     - Preparing for Surgery Before surgery, you can play an important role.  Because skin is not sterile, your skin needs to be as free of germs as possible.  You can reduce the number of germs on your skin by washing with CHG (chlorahexidine gluconate) soap before surgery.  CHG is an antiseptic cleaner which kills germs and bonds with the skin to continue killing germs even after washing. Please DO NOT use if you  have an allergy  to CHG or antibacterial soaps.  If your skin becomes reddened/irritated stop using the CHG and inform your nurse when you arrive at Short Stay. Do not shave (including legs and underarms) for at least 48 hours prior to the first CHG shower.  You may shave your face/neck.  Please follow these instructions carefully:  1.  Shower with CHG Soap the night before surgery ONLY (DO NOT USE THE SOAP THE MORNING OF SURGERY).  2.  If you choose to wash your hair, wash your hair first as usual with your normal  shampoo.  3.  After you shampoo, rinse your hair  and body thoroughly to remove the shampoo.                             4.  Use CHG as you would any other liquid soap.  You can apply chg directly to the skin and wash.  Gently with a scrungie or clean washcloth.  5.  Apply the CHG Soap to your body ONLY FROM THE NECK DOWN.   Do   not use on face/ open                           Wound or open sores. Avoid contact with eyes, ears mouth and   genitals (private parts).                       Wash face,  Genitals (private parts) with your normal soap.             6.  Wash thoroughly, paying special attention to the area where your    surgery  will be performed.  7.  Thoroughly rinse your body with warm water from the neck down.  8.  DO NOT shower/wash with your normal soap after using and rinsing off the CHG Soap.                9.  Pat yourself dry with a clean towel.            10.  Wear clean pajamas.            11.  Place clean sheets on your bed the night of your first shower and do not  sleep with pets. Day of Surgery : Do not apply any CHG, lotions/deodorants the morning of surgery.  Please wear clean clothes to the hospital/surgery center.  FAILURE TO FOLLOW THESE INSTRUCTIONS MAY RESULT IN THE CANCELLATION OF YOUR SURGERY  PATIENT SIGNATURE_________________________________  NURSE SIGNATURE__________________________________  ________________________________________________________________________

## 2024-03-04 NOTE — Progress Notes (Signed)
 Anesthesia Review:  PCP: Cardiologist :  PPM/ ICD: Device Orders: Rep Notified:  Chest x-ray : 2v-04/17/23  CT chest- 11/01/23  EKG : 04/19/2023  Echo : 04/19/23  Stress test: Cardiac Cath :   Activity level:  Sleep Study/ CPAP : Fasting Blood Sugar :      / Checks Blood Sugar -- times a day:     Jardiance-   Blood Thinner/ Instructions /Last Dose: ASA / Instructions/ Last Dose :    Eliquis - last dose on

## 2024-03-05 ENCOUNTER — Other Ambulatory Visit: Payer: Self-pay

## 2024-03-05 ENCOUNTER — Encounter (HOSPITAL_COMMUNITY): Payer: Self-pay

## 2024-03-05 ENCOUNTER — Encounter (HOSPITAL_COMMUNITY)
Admission: RE | Admit: 2024-03-05 | Discharge: 2024-03-05 | Disposition: A | Discharge status: Home / Self Care | Source: Ambulatory Visit | Attending: General Surgery | Admitting: General Surgery

## 2024-03-05 VITALS — BP 135/72 | HR 76 | Temp 98.7°F | Resp 16 | Ht 62.0 in | Wt 108.0 lb

## 2024-03-05 DIAGNOSIS — Z01818 Encounter for other preprocedural examination: Secondary | ICD-10-CM | POA: Diagnosis present

## 2024-03-05 DIAGNOSIS — Z01812 Encounter for preprocedural laboratory examination: Secondary | ICD-10-CM | POA: Diagnosis not present

## 2024-03-05 LAB — CBC
HCT: 40.5 % (ref 36.0–46.0)
Hemoglobin: 12.8 g/dL (ref 12.0–15.0)
MCH: 30.3 pg (ref 26.0–34.0)
MCHC: 31.6 g/dL (ref 30.0–36.0)
MCV: 95.7 fL (ref 80.0–100.0)
Platelets: 154 10*3/uL (ref 150–400)
RBC: 4.23 MIL/uL (ref 3.87–5.11)
RDW: 14.4 % (ref 11.5–15.5)
WBC: 7.5 10*3/uL (ref 4.0–10.5)
nRBC: 0 % (ref 0.0–0.2)

## 2024-03-05 LAB — BASIC METABOLIC PANEL WITH GFR
Anion gap: 12 (ref 5–15)
BUN: 16 mg/dL (ref 8–23)
CO2: 21 mmol/L — ABNORMAL LOW (ref 22–32)
Calcium: 8.7 mg/dL — ABNORMAL LOW (ref 8.9–10.3)
Chloride: 108 mmol/L (ref 98–111)
Creatinine, Ser: 1.64 mg/dL — ABNORMAL HIGH (ref 0.44–1.00)
GFR, Estimated: 31 mL/min — ABNORMAL LOW
Glucose, Bld: 125 mg/dL — ABNORMAL HIGH (ref 70–99)
Potassium: 3.7 mmol/L (ref 3.5–5.1)
Sodium: 141 mmol/L (ref 135–145)

## 2024-03-07 NOTE — Anesthesia Preprocedure Evaluation (Signed)
 Anesthesia Evaluation  Patient identified by MRN, date of birth, ID band Patient awake  General Assessment Comment:  Patient had tea with milk at 0730.  Reviewed: Allergy & Precautions, NPO status , Patient's Chart, lab work & pertinent test results  History of Anesthesia Complications Negative for: history of anesthetic complications  Airway Mallampati: II  TM Distance: >3 FB Neck ROM: Full    Dental no notable dental hx. (+) Teeth Intact   Pulmonary sleep apnea , neg COPD, Patient abstained from smoking.Not current smoker   Pulmonary exam normal breath sounds clear to auscultation       Cardiovascular Exercise Tolerance: Good METShypertension, Pt. on medications (-) CAD and (-) Past MI (-) dysrhythmias  Rhythm:Regular Rate:Normal - Systolic murmurs    Neuro/Psych  PSYCHIATRIC DISORDERS Anxiety Depression    negative neurological ROS     GI/Hepatic ,GERD  Controlled,,(+)     (-) substance abuse    Endo/Other  neg diabetesHypothyroidism    Renal/GU Renal diseaseS/p renal transplant. On prednisone  5mg  daily along with immunosuppressants. 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/2015: Osteoarthritis of both knees No date: Pneumonia No date: Pre-diabetes 12/15/2015: Shoulder impingement, left No date: Sleep apnea  Reproductive/Obstetrics                               Anesthesia Physical Anesthesia Plan  ASA: 3  Anesthesia Plan: Spinal   Post-op Pain Management: Tylenol  PO (pre-op )*   Induction: Intravenous  PONV Risk Score and Plan: 1 and Ondansetron , Dexamethasone , Propofol  infusion, TIVA, Treatment may vary due to age or medical condition and Midazolam   Airway Management Planned: Natural Airway  Additional Equipment: None  Intra-op Plan:   Post-operative Plan:   Informed Consent: I have reviewed the patients History and Physical, chart, labs and discussed the procedure including the risks, benefits and alternatives for the proposed anesthesia with the patient or authorized representative who has indicated his/her understanding and acceptance.       Plan Discussed with: CRNA and Surgeon  Anesthesia Plan Comments: (Discussed with surgeon patient's inappropriate NPO status. Will proceed 6hrs after last milk ingestion which would make it 1330.  Discussed R/B/A of neuraxial anesthesia technique with patient: - rare risks of spinal/epidural hematoma, nerve damage, infection - Risk of PDPH - Risk of difficulty with spontaneous respiration requiring airway intervention and its associated risks - PONV  Patient understands.  )         Anesthesia Quick Evaluation

## 2024-03-14 ENCOUNTER — Other Ambulatory Visit: Payer: Self-pay

## 2024-03-14 ENCOUNTER — Encounter (HOSPITAL_COMMUNITY): Payer: Self-pay

## 2024-03-14 ENCOUNTER — Encounter (HOSPITAL_COMMUNITY): Admission: RE | Payer: Self-pay | Source: Home / Self Care

## 2024-03-14 ENCOUNTER — Encounter (HOSPITAL_COMMUNITY): Payer: Self-pay | Admitting: Medical

## 2024-03-14 ENCOUNTER — Inpatient Hospital Stay (HOSPITAL_COMMUNITY): Admission: RE | Admit: 2024-03-14 | Source: Home / Self Care | Admitting: General Surgery

## 2024-03-14 ENCOUNTER — Encounter (HOSPITAL_COMMUNITY): Payer: Self-pay | Admitting: General Surgery

## 2024-03-14 DIAGNOSIS — Z01818 Encounter for other preprocedural examination: Secondary | ICD-10-CM

## 2024-03-14 DIAGNOSIS — K623 Rectal prolapse: Principal | ICD-10-CM | POA: Diagnosis present

## 2024-03-14 LAB — TYPE AND SCREEN
ABO/RH(D): O POS
Antibody Screen: NEGATIVE

## 2024-03-14 MED ORDER — HEPARIN SODIUM (PORCINE) 5000 UNIT/ML IJ SOLN
5000.0000 [IU] | Freq: Once | INTRAMUSCULAR | Status: AC
  Administered 2024-03-14: 5000 [IU] via SUBCUTANEOUS
  Filled 2024-03-14: qty 1

## 2024-03-14 MED ORDER — KETAMINE HCL 50 MG/5ML IJ SOSY
PREFILLED_SYRINGE | INTRAMUSCULAR | Status: DC | PRN
  Administered 2024-03-14: 10 mg via INTRAVENOUS

## 2024-03-14 MED ORDER — ORAL CARE MOUTH RINSE: 15.0000 mL | Freq: Once | OROMUCOSAL | Status: AC

## 2024-03-14 MED ORDER — HYDROMORPHONE HCL 1 MG/ML IJ SOLN
0.5000 mg | INTRAMUSCULAR | Status: AC | PRN
  Administered 2024-03-14: 0.5 mg via INTRAVENOUS
  Filled 2024-03-14: qty 0.5

## 2024-03-14 MED ORDER — ROCURONIUM BROMIDE 10 MG/ML (PF) SYRINGE
PREFILLED_SYRINGE | INTRAVENOUS | Status: DC | PRN
  Administered 2024-03-14 (×2): 10 mg via INTRAVENOUS
  Administered 2024-03-14: 40 mg via INTRAVENOUS
  Administered 2024-03-14: 20 mg via INTRAVENOUS

## 2024-03-14 MED ORDER — LIDOCAINE HCL 2 % IJ SOLN
INTRAMUSCULAR | Status: AC
  Filled 2024-03-14: qty 20

## 2024-03-14 MED ORDER — PANTOPRAZOLE SODIUM 40 MG PO TBEC: 40.0000 mg | DELAYED_RELEASE_TABLET | Freq: Every day | ORAL | Status: AC

## 2024-03-14 MED ORDER — ONDANSETRON HCL 4 MG/2ML IJ SOLN
INTRAMUSCULAR | Status: DC | PRN
  Administered 2024-03-14: 4 mg via INTRAVENOUS

## 2024-03-14 MED ORDER — PHENYLEPHRINE HCL (PRESSORS) 10 MG/ML IV SOLN
INTRAVENOUS | Status: AC
  Filled 2024-03-14: qty 1

## 2024-03-14 MED ORDER — ACETAMINOPHEN 500 MG PO TABS
1000.0000 mg | ORAL_TABLET | ORAL | Status: AC
  Administered 2024-03-14: 1000 mg via ORAL
  Filled 2024-03-14: qty 2

## 2024-03-14 MED ORDER — PROPOFOL 500 MG/50ML IV EMUL
INTRAVENOUS | Status: DC | PRN
  Administered 2024-03-14: 150 ug/kg/min via INTRAVENOUS

## 2024-03-14 MED ORDER — CHLORHEXIDINE GLUCONATE 0.12 % MT SOLN
15.0000 mL | Freq: Once | OROMUCOSAL | Status: AC
  Administered 2024-03-14: 15 mL via OROMUCOSAL

## 2024-03-14 MED ORDER — DIPHENHYDRAMINE HCL 50 MG/ML IJ SOLN: 12.5000 mg | Freq: Four times a day (QID) | INTRAMUSCULAR | Status: AC | PRN

## 2024-03-14 MED ORDER — CALCITRIOL 0.25 MCG PO CAPS: 0.2500 ug | ORAL_CAPSULE | Freq: Every day | ORAL | Status: AC

## 2024-03-14 MED ORDER — DOCUSATE SODIUM 100 MG PO CAPS
100.0000 mg | ORAL_CAPSULE | Freq: Two times a day (BID) | ORAL | Status: AC
  Administered 2024-03-14: 100 mg via ORAL
  Filled 2024-03-14: qty 1

## 2024-03-14 MED ORDER — SODIUM CHLORIDE 0.9 % IV SOLN
2.0000 g | Freq: Two times a day (BID) | INTRAVENOUS | Status: AC
  Filled 2024-03-14: qty 2

## 2024-03-14 MED ORDER — ONDANSETRON HCL 4 MG/2ML IJ SOLN: 4.0000 mg | Freq: Once | INTRAMUSCULAR | Status: DC | PRN

## 2024-03-14 MED ORDER — SACCHAROMYCES BOULARDII 250 MG PO CAPS
250.0000 mg | ORAL_CAPSULE | Freq: Two times a day (BID) | ORAL | Status: AC
  Administered 2024-03-14: 250 mg via ORAL
  Filled 2024-03-14: qty 1

## 2024-03-14 MED ORDER — TAMOXIFEN CITRATE 10 MG PO TABS: 20.0000 mg | ORAL_TABLET | Freq: Every day | ORAL | Status: AC

## 2024-03-14 MED ORDER — LACTATED RINGERS IR SOLN
Status: DC | PRN
  Administered 2024-03-14: 1000 mL

## 2024-03-14 MED ORDER — FENTANYL CITRATE (PF) 100 MCG/2ML IJ SOLN
INTRAMUSCULAR | Status: AC
  Filled 2024-03-14: qty 2

## 2024-03-14 MED ORDER — PROPOFOL 1000 MG/100ML IV EMUL
INTRAVENOUS | Status: AC
  Filled 2024-03-14: qty 100

## 2024-03-14 MED ORDER — ENSURE SURGERY PO LIQD: 237.0000 mL | Freq: Two times a day (BID) | ORAL | Status: AC

## 2024-03-14 MED ORDER — IRBESARTAN 150 MG PO TABS: 150.0000 mg | ORAL_TABLET | Freq: Every day | ORAL | Status: AC

## 2024-03-14 MED ORDER — DIPHENHYDRAMINE HCL 12.5 MG/5ML PO ELIX: 12.5000 mg | ORAL_SOLUTION | Freq: Four times a day (QID) | ORAL | Status: AC | PRN

## 2024-03-14 MED ORDER — IPRATROPIUM-ALBUTEROL 0.5-2.5 (3) MG/3ML IN SOLN: 3.0000 mL | Freq: Every day | RESPIRATORY_TRACT | Status: AC | PRN

## 2024-03-14 MED ORDER — PROPOFOL 10 MG/ML IV BOLUS
INTRAVENOUS | Status: DC | PRN
  Administered 2024-03-14: 120 mg via INTRAVENOUS

## 2024-03-14 MED ORDER — ENSURE PRE-SURGERY PO LIQD: 592.0000 mL | Freq: Once | ORAL | Status: DC

## 2024-03-14 MED ORDER — PROPOFOL 1000 MG/100ML IV EMUL
INTRAVENOUS | Status: AC
  Filled 2024-03-14: qty 200

## 2024-03-14 MED ORDER — EMPAGLIFLOZIN 10 MG PO TABS: 10.0000 mg | ORAL_TABLET | Freq: Every morning | ORAL | Status: AC

## 2024-03-14 MED ORDER — DEXAMETHASONE SOD PHOSPHATE PF 10 MG/ML IJ SOLN
INTRAMUSCULAR | Status: DC | PRN
  Administered 2024-03-14: 5 mg via INTRAVENOUS

## 2024-03-14 MED ORDER — PRAVASTATIN SODIUM 20 MG PO TABS: 40.0000 mg | ORAL_TABLET | Freq: Every day | ORAL | Status: AC

## 2024-03-14 MED ORDER — LACTATED RINGERS IV SOLN: INTRAVENOUS | Status: DC

## 2024-03-14 MED ORDER — ALVIMOPAN 12 MG PO CAPS: 12.0000 mg | ORAL_CAPSULE | Freq: Two times a day (BID) | ORAL | Status: AC

## 2024-03-14 MED ORDER — FENTANYL CITRATE (PF) 50 MCG/ML IJ SOSY: 25.0000 ug | PREFILLED_SYRINGE | INTRAMUSCULAR | Status: DC | PRN

## 2024-03-14 MED ORDER — ENSURE PRE-SURGERY PO LIQD: 296.0000 mL | Freq: Once | ORAL | Status: DC

## 2024-03-14 MED ORDER — LIDOCAINE HCL (PF) 2 % IJ SOLN
INTRAMUSCULAR | Status: DC | PRN
  Administered 2024-03-14: 1.5 mg/kg/h via INTRADERMAL

## 2024-03-14 MED ORDER — ACETAMINOPHEN 500 MG PO TABS
1000.0000 mg | ORAL_TABLET | Freq: Four times a day (QID) | ORAL | Status: AC
  Administered 2024-03-14: 1000 mg via ORAL
  Filled 2024-03-14: qty 2

## 2024-03-14 MED ORDER — ONDANSETRON HCL 4 MG/2ML IJ SOLN: 4.0000 mg | Freq: Four times a day (QID) | INTRAMUSCULAR | Status: AC | PRN

## 2024-03-14 MED ORDER — METOPROLOL TARTRATE 25 MG PO TABS
25.0000 mg | ORAL_TABLET | Freq: Two times a day (BID) | ORAL | Status: AC
  Administered 2024-03-14: 25 mg via ORAL
  Filled 2024-03-14: qty 1

## 2024-03-14 MED ORDER — DEXMEDETOMIDINE HCL IN NACL 80 MCG/20ML IV SOLN
INTRAVENOUS | Status: DC | PRN
  Administered 2024-03-14: 4 ug via INTRAVENOUS

## 2024-03-14 MED ORDER — FENTANYL CITRATE (PF) 100 MCG/2ML IJ SOLN
INTRAMUSCULAR | Status: DC | PRN
  Administered 2024-03-14: 25 ug via INTRAVENOUS
  Administered 2024-03-14: 50 ug via INTRAVENOUS
  Administered 2024-03-14 (×3): 25 ug via INTRAVENOUS

## 2024-03-14 MED ORDER — KCL IN DEXTROSE-NACL 20-5-0.45 MEQ/L-%-% IV SOLN
INTRAVENOUS | Status: AC
  Filled 2024-03-14: qty 1000

## 2024-03-14 MED ORDER — ENOXAPARIN SODIUM 40 MG/0.4ML IJ SOSY: 40.0000 mg | PREFILLED_SYRINGE | INTRAMUSCULAR | Status: AC

## 2024-03-14 MED ORDER — 0.9 % SODIUM CHLORIDE (POUR BTL) OPTIME
TOPICAL | Status: DC | PRN
  Administered 2024-03-14: 1000 mL

## 2024-03-14 MED ORDER — ACETAMINOPHEN 10 MG/ML IV SOLN: 650.0000 mg | Freq: Once | INTRAVENOUS | Status: DC | PRN

## 2024-03-14 MED ORDER — ALUM & MAG HYDROXIDE-SIMETH 200-200-20 MG/5ML PO SUSP: 30.0000 mL | Freq: Four times a day (QID) | ORAL | Status: AC | PRN

## 2024-03-14 MED ORDER — BUPIVACAINE-EPINEPHRINE 0.25% -1:200000 IJ SOLN
INTRAMUSCULAR | Status: DC | PRN
  Administered 2024-03-14: 30 mL

## 2024-03-14 MED ORDER — BUDESONIDE-FORMOTEROL FUMARATE 160-4.5 MCG/ACT IN AERO
2.0000 | INHALATION_SPRAY | Freq: Two times a day (BID) | RESPIRATORY_TRACT | Status: AC
  Administered 2024-03-14: 2 via RESPIRATORY_TRACT
  Filled 2024-03-14: qty 6

## 2024-03-14 MED ORDER — SUGAMMADEX SODIUM 200 MG/2ML IV SOLN
INTRAVENOUS | Status: AC
  Filled 2024-03-14: qty 6

## 2024-03-14 MED ORDER — SUGAMMADEX SODIUM 200 MG/2ML IV SOLN
INTRAVENOUS | Status: DC | PRN
  Administered 2024-03-14: 200 mg via INTRAVENOUS

## 2024-03-14 MED ORDER — PROPOFOL 10 MG/ML IV BOLUS
INTRAVENOUS | Status: AC
  Filled 2024-03-14: qty 20

## 2024-03-14 MED ORDER — KETAMINE HCL 50 MG/5ML IJ SOSY
PREFILLED_SYRINGE | INTRAMUSCULAR | Status: AC
  Filled 2024-03-14: qty 5

## 2024-03-14 MED ORDER — ONDANSETRON HCL 4 MG PO TABS: 4.0000 mg | ORAL_TABLET | Freq: Four times a day (QID) | ORAL | Status: AC | PRN

## 2024-03-14 MED ORDER — SIMETHICONE 80 MG PO CHEW: 40.0000 mg | CHEWABLE_TABLET | Freq: Four times a day (QID) | ORAL | Status: AC | PRN

## 2024-03-14 MED ORDER — OXYCODONE HCL 5 MG PO TABS: 5.0000 mg | ORAL_TABLET | Freq: Four times a day (QID) | ORAL | Status: AC | PRN

## 2024-03-14 MED ORDER — BUPIVACAINE-EPINEPHRINE (PF) 0.25% -1:200000 IJ SOLN
INTRAMUSCULAR | Status: AC
  Filled 2024-03-14: qty 60

## 2024-03-14 MED ORDER — OLMESARTAN MEDOXOMIL-HCTZ 20-12.5 MG PO TABS: 1.0000 | ORAL_TABLET | Freq: Every day | ORAL | Status: DC

## 2024-03-14 MED ORDER — SODIUM CHLORIDE 0.9 % IV SOLN
2.0000 g | INTRAVENOUS | Status: AC
  Administered 2024-03-14: 2 g via INTRAVENOUS
  Filled 2024-03-14: qty 2

## 2024-03-14 NOTE — Anesthesia Procedure Notes (Signed)
 Procedure Name: Intubation Date/Time: 03/14/2024 2:20 PM  Performed by: Franchot Delon RAMAN, CRNAPre-anesthesia Checklist: Patient identified, Emergency Drugs available, Suction available and Patient being monitored Patient Re-evaluated:Patient Re-evaluated prior to induction Oxygen Delivery Method: Circle System Utilized Preoxygenation: Pre-oxygenation with 100% oxygen Induction Type: IV induction Ventilation: Mask ventilation without difficulty Laryngoscope Size: Miller and 2 Grade View: Grade I Tube type: Oral Tube size: 7.0 mm Number of attempts: 1 Airway Equipment and Method: Stylet and Oral airway Placement Confirmation: ETT inserted through vocal cords under direct vision, positive ETCO2 and breath sounds checked- equal and bilateral Secured at: 21 cm Tube secured with: Tape Dental Injury: Teeth and Oropharynx as per pre-operative assessment

## 2024-03-14 NOTE — Transfer of Care (Signed)
 Immediate Anesthesia Transfer of Care Note  Patient: Jane Griffin  Procedure(s) Performed: RECTOPEXY, ROBOT-ASSISTED, FLEXIBLE SIGMOIDOSCOPY (Abdomen)  Patient Location: PACU  Anesthesia Type:General  Level of Consciousness: drowsy  Airway & Oxygen Therapy: Patient Spontanous Breathing and Patient connected to face mask oxygen  Post-op Assessment: Report given to RN, Post -op Vital signs reviewed and stable, and Patient moving all extremities X 4  Post vital signs: Reviewed and stable  Last Vitals:  Vitals Value Taken Time  BP 158/77 03/14/24 17:08  Temp    Pulse 77 03/14/24 17:10  Resp 16 03/14/24 17:10  SpO2 99 % 03/14/24 17:10  Vitals shown include unfiled device data.  Last Pain:  Vitals:   03/14/24 1156  TempSrc:   PainSc: 0-No pain         Complications: No notable events documented.

## 2024-03-14 NOTE — Op Note (Signed)
 03/14/2024  4:54 PM  PATIENT:  Jane Griffin  84 y.o. female  Patient Care Team: Lenon Layman ORN, MD as PCP - General (Unknown Physician Specialty) Lenon Layman ORN, MD (Internal Medicine) Georgina Shasta POUR, RN as Oncology Nurse Navigator Lenn Aran, MD as Consulting Physician (Radiation Oncology) Jacobo Evalene PARAS, MD as Consulting Physician (Oncology)  PRE-OPERATIVE DIAGNOSIS:  RECTAL PROLAPSE  POST-OPERATIVE DIAGNOSIS:  RECTAL PROLAPSE  PROCEDURE:   ROBOT-ASSISTED RECTOPEXY, FLEXIBLE SIGMOIDOSCOPY    Surgeon(s): Debby Hila, MD Teresa Lonni HERO, MD  ASSISTANT: Dr Teresa   ANESTHESIA:   local and general  EBL: 10ml Total I/O In: 100 [IV Piggyback:100] Out: -   Delay start of Pharmacological VTE agent (>24hrs) due to surgical blood loss or risk of bleeding:  no  DRAINS: (76F) Jackson-Pratt drain(s) with closed bulb suction in the pelvis   SPECIMEN:  none  DISPOSITION OF SPECIMEN:  PATHOLOGY  COUNTS:  YES  PLAN OF CARE: Admit to inpatient   PATIENT DISPOSITION:  PACU - hemodynamically stable.  INDICATION:    84 y.o. F with rectal prolapse.  I recommended rectopexy:  The anatomy & physiology of the digestive tract was discussed.  The pathophysiology was discussed.  Natural history risks without surgery was discussed.   I worked to give an overview of the disease and the frequent need to have multispecialty involvement.  I feel the risks of no intervention will lead to serious problems that outweigh the operative risks; therefore, I recommended a partial colectomy to remove the pathology.  Laparoscopic & open techniques were discussed.   Risks such as bleeding, infection, abscess, leak, reoperation, possible ostomy, hernia, heart attack, death, and other risks were discussed.  I noted a good likelihood this will help address the problem.   Goals of post-operative recovery were discussed as well.    The patient expressed understanding & wished to  proceed with surgery.  OR FINDINGS:   Patient had significant scarring in the right posterior perirectal area   DESCRIPTION:   Informed consent was confirmed.  The patient underwent general anaesthesia without difficulty.  The patient was positioned appropriately.  VTE prevention in place.  The patient's abdomen was clipped, prepped, & draped in a sterile fashion.  Surgical timeout confirmed our plan.  The patient was positioned in reverse Trendelenburg.  Abdominal entry was gained using a Varies needle in the LUQ.  Entry was clean.  I induced carbon dioxide insufflation.  An 8mm robotic port was placed in the RUQ.  Camera inspection revealed no injury.  Extra ports were carefully placed under direct laparoscopic visualization.  I laparoscopically reflected the greater omentum and the upper abdomen the small bowel in the upper abdomen. The patient was appropriately positioned and the robot was docked to the patient's left side.  Instruments were placed under direct visualization.    I mobilized the sigmoid colon out of the pelvis.  I scored the base of peritoneum of the right side of the mesentery of the colon from the sacral promontory and continued down towards the pelvic floor.   I elevated the sigmoid mesentery and enetered into the retro-mesenteric plane. We were able to identify the left ureter and gonadal vessels. We continued into the retro mesenteric space using blunt dissection.  I dissected out the rectal mesentery down to the level of the pelvic floor.  I encountered the prolapse and began to dissect out the dense scar around the prolapse bluntly.  I continued into the anal canal.  There was  an area of thinned rectum at the right posterior anorectal junction that broke open when I completed her digital anal exam.  Given that this area was low and appeared well perfused without signs of cautery injury, I decided to close this in 2 layers using running V lock sutures robotically.  This was  then tested with insufflation under irrigation and it was noted to be air tight.  I then proceeded with the rectopexy.  I used 1 V-Loc stay suture to fix the anterior peritoneal reflection to the sacral promontory.  I then placed 2, 0 Ethibond sutures into the sacral promontory and tacked these to the anterior peritoneal reflection tissue as well.  This provided good fixation and there was no more prolapse noted.  I then fixed the sigmoid colon to the left anterior peritoneum with an Ethibond suture as well.  The peritoneal reflection was then closed using 2 running 2 oh V-Loc sutures.  A 19 French Blake drain was left in the pelvis and bthe splenic flexure to ensure good mobilization of the left colon to reach into the pelvis.  The remainder of the abdomen was inspected.  There was no sign of injury or other pathology.  The abdomen was desufflated and the robot was undocked.  The ports were removed and the port sites were closed using interrupted 4-0 Vicryl sutures and Dermabond.  The patient tolerated this well and was extubated and sent to the postanesthesia care unit in stable condition.  All counts were correct per operating room staff.  An MD assistant was necessary for tissue manipulation, retraction and positioning due to the complexity of the case and hospital policies   Bernarda JAYSON Ned, MD  Colorectal and General Surgery [facility]

## 2024-03-14 NOTE — Discharge Instructions (Signed)

## 2024-03-14 NOTE — Anesthesia Postprocedure Evaluation (Signed)
"   Anesthesia Post Note  Patient: Jane Griffin  Procedure(s) Performed: RECTOPEXY, ROBOT-ASSISTED, FLEXIBLE SIGMOIDOSCOPY (Abdomen)     Patient location during evaluation: PACU Anesthesia Type: General Level of consciousness: awake and alert Pain management: pain level controlled Vital Signs Assessment: post-procedure vital signs reviewed and stable Respiratory status: spontaneous breathing, nonlabored ventilation, respiratory function stable and patient connected to nasal cannula oxygen Cardiovascular status: blood pressure returned to baseline and stable Postop Assessment: no apparent nausea or vomiting Anesthetic complications: no   No notable events documented.  Last Vitals:  Vitals:   03/14/24 1830 03/14/24 1852  BP: (!) 175/94 (!) 174/93  Pulse: 99 (!) 103  Resp: (!) 23 18  Temp:  37.3 C  SpO2: 91% 93%    Last Pain:  Vitals:   03/14/24 1930  TempSrc:   PainSc: 10-Worst pain ever                 Garnette DELENA Gab      "

## 2024-03-14 NOTE — Interval H&P Note (Signed)
 History and Physical Interval Note:  03/14/2024 1:25 PM  Jane Griffin  has presented today for surgery, with the diagnosis of RECTAL PROLAPSE.  The various methods of treatment have been discussed with the patient and family. After consideration of risks, benefits and other options for treatment, the patient has consented to  Procedures: RECTOPEXY, ROBOT-ASSISTED (N/A) as a surgical intervention.  The patient's history has been reviewed, patient examined, no change in status, stable for surgery.  I have reviewed the patient's chart and labs.  Questions were answered to the patient's satisfaction.     Bernarda JAYSON Ned, MD  Colorectal and General Surgery [facility]

## 2024-03-25 ENCOUNTER — Ambulatory Visit: Admitting: Physical Therapy

## 2024-04-01 ENCOUNTER — Ambulatory Visit: Admitting: Physical Therapy

## 2024-04-08 ENCOUNTER — Ambulatory Visit: Admitting: Physical Therapy

## 2024-04-15 ENCOUNTER — Ambulatory Visit: Admitting: Physical Therapy

## 2024-04-16 ENCOUNTER — Ambulatory Visit: Admitting: Radiation Oncology

## 2024-04-22 ENCOUNTER — Ambulatory Visit: Admitting: Physical Therapy

## 2024-04-29 ENCOUNTER — Ambulatory Visit: Admitting: Physical Therapy

## 2024-05-06 ENCOUNTER — Ambulatory Visit: Admitting: Physical Therapy

## 2024-05-13 ENCOUNTER — Ambulatory Visit: Admitting: Physical Therapy

## 2024-05-20 ENCOUNTER — Ambulatory Visit: Admitting: Physical Therapy

## 2024-05-27 ENCOUNTER — Ambulatory Visit: Admitting: Physical Therapy

## 2024-08-20 ENCOUNTER — Inpatient Hospital Stay: Admitting: Oncology
